# Patient Record
Sex: Female | Born: 1960 | Race: White | Hispanic: No | Marital: Married | Smoking: Former smoker
Health system: Southern US, Community
[De-identification: ages and names within clinical notes are randomized; demographics above are authoritative.]

## PROBLEM LIST (undated history)

## (undated) ENCOUNTER — Emergency Department (HOSPITAL_COMMUNITY): Admission: EM | Source: Home / Self Care

## (undated) DIAGNOSIS — I609 Nontraumatic subarachnoid hemorrhage, unspecified: Secondary | ICD-10-CM

## (undated) DIAGNOSIS — M1990 Unspecified osteoarthritis, unspecified site: Secondary | ICD-10-CM

## (undated) DIAGNOSIS — J302 Other seasonal allergic rhinitis: Secondary | ICD-10-CM

## (undated) DIAGNOSIS — I10 Essential (primary) hypertension: Secondary | ICD-10-CM

## (undated) DIAGNOSIS — I2699 Other pulmonary embolism without acute cor pulmonale: Secondary | ICD-10-CM

## (undated) DIAGNOSIS — C439 Malignant melanoma of skin, unspecified: Secondary | ICD-10-CM

## (undated) DIAGNOSIS — E785 Hyperlipidemia, unspecified: Secondary | ICD-10-CM

## (undated) DIAGNOSIS — T1491XA Suicide attempt, initial encounter: Secondary | ICD-10-CM

## (undated) DIAGNOSIS — D6851 Activated protein C resistance: Secondary | ICD-10-CM

## (undated) DIAGNOSIS — C4490 Unspecified malignant neoplasm of skin, unspecified: Secondary | ICD-10-CM

## (undated) DIAGNOSIS — B182 Chronic viral hepatitis C: Secondary | ICD-10-CM

## (undated) HISTORY — PX: SPLENECTOMY: SUR1306

## (undated) HISTORY — DX: Malignant melanoma of skin, unspecified: C43.9

## (undated) HISTORY — DX: Other pulmonary embolism without acute cor pulmonale: I26.99

## (undated) HISTORY — DX: Essential (primary) hypertension: I10

## (undated) HISTORY — DX: Unspecified malignant neoplasm of skin, unspecified: C44.90

## (undated) HISTORY — PX: CRANIOTOMY: SHX93

## (undated) HISTORY — DX: Hyperlipidemia, unspecified: E78.5

## (undated) HISTORY — DX: Chronic viral hepatitis C: B18.2

## (undated) HISTORY — DX: Nontraumatic subarachnoid hemorrhage, unspecified: I60.9

## (undated) HISTORY — DX: Activated protein C resistance: D68.51

## (undated) HISTORY — DX: Other seasonal allergic rhinitis: J30.2

## (undated) HISTORY — DX: Suicide attempt, initial encounter: T14.91XA

## (undated) HISTORY — DX: Unspecified osteoarthritis, unspecified site: M19.90

---

## 1982-01-14 HISTORY — PX: SPLENECTOMY: SUR1306

## 1997-05-19 ENCOUNTER — Other Ambulatory Visit: Admission: RE | Admit: 1997-05-19 | Discharge: 1997-05-19 | Payer: Self-pay | Admitting: Obstetrics and Gynecology

## 1999-05-05 ENCOUNTER — Inpatient Hospital Stay (HOSPITAL_COMMUNITY): Admission: EM | Admit: 1999-05-05 | Discharge: 1999-05-09 | Payer: Self-pay

## 1999-05-05 ENCOUNTER — Encounter: Payer: Self-pay | Admitting: Surgery

## 1999-05-21 ENCOUNTER — Encounter: Admission: RE | Admit: 1999-05-21 | Discharge: 1999-07-04 | Payer: Self-pay | Admitting: Orthopedic Surgery

## 1999-08-17 ENCOUNTER — Other Ambulatory Visit: Admission: RE | Admit: 1999-08-17 | Discharge: 1999-08-17 | Payer: Self-pay | Admitting: Obstetrics and Gynecology

## 1999-11-13 ENCOUNTER — Inpatient Hospital Stay (HOSPITAL_COMMUNITY): Admission: EM | Admit: 1999-11-13 | Discharge: 1999-11-26 | Payer: Self-pay | Admitting: Emergency Medicine

## 1999-11-13 ENCOUNTER — Encounter: Payer: Self-pay | Admitting: Emergency Medicine

## 1999-11-16 ENCOUNTER — Encounter: Payer: Self-pay | Admitting: Family Medicine

## 1999-11-21 ENCOUNTER — Encounter: Payer: Self-pay | Admitting: Neurosurgery

## 2001-05-14 DIAGNOSIS — I2699 Other pulmonary embolism without acute cor pulmonale: Secondary | ICD-10-CM

## 2001-05-14 HISTORY — DX: Other pulmonary embolism without acute cor pulmonale: I26.99

## 2001-05-17 ENCOUNTER — Inpatient Hospital Stay (HOSPITAL_COMMUNITY): Admission: EM | Admit: 2001-05-17 | Discharge: 2001-05-22 | Payer: Self-pay | Admitting: Emergency Medicine

## 2001-05-17 ENCOUNTER — Encounter: Payer: Self-pay | Admitting: Emergency Medicine

## 2001-05-18 ENCOUNTER — Encounter: Payer: Self-pay | Admitting: Internal Medicine

## 2001-05-20 ENCOUNTER — Encounter: Payer: Self-pay | Admitting: Internal Medicine

## 2001-05-21 ENCOUNTER — Encounter: Payer: Self-pay | Admitting: Internal Medicine

## 2001-05-30 ENCOUNTER — Encounter: Payer: Self-pay | Admitting: Emergency Medicine

## 2001-05-30 ENCOUNTER — Inpatient Hospital Stay (HOSPITAL_COMMUNITY): Admission: EM | Admit: 2001-05-30 | Discharge: 2001-05-31 | Payer: Self-pay

## 2001-06-04 ENCOUNTER — Encounter: Admission: RE | Admit: 2001-06-04 | Discharge: 2001-06-04 | Payer: Self-pay | Admitting: Internal Medicine

## 2001-06-11 ENCOUNTER — Encounter: Admission: RE | Admit: 2001-06-11 | Discharge: 2001-06-11 | Payer: Self-pay | Admitting: Internal Medicine

## 2001-07-13 ENCOUNTER — Encounter: Admission: RE | Admit: 2001-07-13 | Discharge: 2001-07-13 | Payer: Self-pay | Admitting: Internal Medicine

## 2001-07-27 ENCOUNTER — Encounter: Admission: RE | Admit: 2001-07-27 | Discharge: 2001-07-27 | Payer: Self-pay | Admitting: Internal Medicine

## 2002-03-08 ENCOUNTER — Encounter: Admission: RE | Admit: 2002-03-08 | Discharge: 2002-03-08 | Payer: Self-pay | Admitting: Internal Medicine

## 2002-03-11 ENCOUNTER — Encounter: Admission: RE | Admit: 2002-03-11 | Discharge: 2002-03-11 | Payer: Self-pay | Admitting: Internal Medicine

## 2002-04-12 ENCOUNTER — Encounter: Admission: RE | Admit: 2002-04-12 | Discharge: 2002-04-12 | Payer: Self-pay | Admitting: Internal Medicine

## 2002-05-03 ENCOUNTER — Encounter: Admission: RE | Admit: 2002-05-03 | Discharge: 2002-05-03 | Payer: Self-pay | Admitting: Internal Medicine

## 2002-08-09 ENCOUNTER — Encounter: Admission: RE | Admit: 2002-08-09 | Discharge: 2002-08-09 | Payer: Self-pay | Admitting: Internal Medicine

## 2003-04-04 ENCOUNTER — Encounter: Admission: RE | Admit: 2003-04-04 | Discharge: 2003-04-04 | Payer: Self-pay | Admitting: Internal Medicine

## 2003-10-31 ENCOUNTER — Ambulatory Visit: Payer: Self-pay | Admitting: Internal Medicine

## 2003-12-05 ENCOUNTER — Ambulatory Visit: Payer: Self-pay | Admitting: Internal Medicine

## 2004-03-08 ENCOUNTER — Ambulatory Visit: Payer: Self-pay | Admitting: Internal Medicine

## 2004-04-18 ENCOUNTER — Ambulatory Visit: Payer: Self-pay | Admitting: Internal Medicine

## 2004-04-18 ENCOUNTER — Ambulatory Visit (HOSPITAL_COMMUNITY): Admission: RE | Admit: 2004-04-18 | Discharge: 2004-04-18 | Payer: Self-pay | Admitting: Internal Medicine

## 2004-04-20 ENCOUNTER — Ambulatory Visit: Payer: Self-pay | Admitting: Internal Medicine

## 2004-04-23 ENCOUNTER — Ambulatory Visit: Payer: Self-pay | Admitting: Internal Medicine

## 2004-05-07 ENCOUNTER — Ambulatory Visit: Payer: Self-pay | Admitting: Internal Medicine

## 2004-06-26 ENCOUNTER — Emergency Department (HOSPITAL_COMMUNITY): Admission: EM | Admit: 2004-06-26 | Discharge: 2004-06-26 | Payer: Self-pay | Admitting: Emergency Medicine

## 2004-09-10 ENCOUNTER — Ambulatory Visit: Payer: Self-pay | Admitting: Internal Medicine

## 2004-09-24 ENCOUNTER — Ambulatory Visit: Payer: Self-pay | Admitting: Internal Medicine

## 2004-11-08 ENCOUNTER — Ambulatory Visit: Payer: Self-pay | Admitting: Hospitalist

## 2004-12-03 ENCOUNTER — Ambulatory Visit: Payer: Self-pay | Admitting: Internal Medicine

## 2005-09-02 ENCOUNTER — Ambulatory Visit: Payer: Self-pay | Admitting: Internal Medicine

## 2006-08-04 ENCOUNTER — Ambulatory Visit: Payer: Self-pay | Admitting: *Deleted

## 2006-08-04 ENCOUNTER — Encounter: Payer: Self-pay | Admitting: *Deleted

## 2006-08-04 ENCOUNTER — Encounter (INDEPENDENT_AMBULATORY_CARE_PROVIDER_SITE_OTHER): Payer: Self-pay | Admitting: Internal Medicine

## 2006-08-04 ENCOUNTER — Ambulatory Visit: Admission: RE | Admit: 2006-08-04 | Discharge: 2006-08-04 | Payer: Self-pay | Admitting: Hospitalist

## 2006-08-04 ENCOUNTER — Ambulatory Visit (HOSPITAL_COMMUNITY): Admission: RE | Admit: 2006-08-04 | Discharge: 2006-08-04 | Payer: Self-pay | Admitting: Hospitalist

## 2006-08-04 ENCOUNTER — Encounter (INDEPENDENT_AMBULATORY_CARE_PROVIDER_SITE_OTHER): Payer: Self-pay | Admitting: Hospitalist

## 2006-08-04 DIAGNOSIS — I739 Peripheral vascular disease, unspecified: Secondary | ICD-10-CM

## 2006-08-04 DIAGNOSIS — M25529 Pain in unspecified elbow: Secondary | ICD-10-CM

## 2006-08-04 DIAGNOSIS — I10 Essential (primary) hypertension: Secondary | ICD-10-CM

## 2006-08-04 DIAGNOSIS — R42 Dizziness and giddiness: Secondary | ICD-10-CM

## 2006-08-04 DIAGNOSIS — Z86711 Personal history of pulmonary embolism: Secondary | ICD-10-CM

## 2006-08-04 DIAGNOSIS — R079 Chest pain, unspecified: Secondary | ICD-10-CM

## 2006-08-04 DIAGNOSIS — M25579 Pain in unspecified ankle and joints of unspecified foot: Secondary | ICD-10-CM

## 2006-08-04 HISTORY — DX: Essential (primary) hypertension: I10

## 2006-08-06 ENCOUNTER — Encounter (INDEPENDENT_AMBULATORY_CARE_PROVIDER_SITE_OTHER): Payer: Self-pay | Admitting: Internal Medicine

## 2006-08-08 ENCOUNTER — Encounter (INDEPENDENT_AMBULATORY_CARE_PROVIDER_SITE_OTHER): Payer: Self-pay | Admitting: Internal Medicine

## 2006-08-13 ENCOUNTER — Ambulatory Visit (HOSPITAL_COMMUNITY): Admission: RE | Admit: 2006-08-13 | Discharge: 2006-08-13 | Payer: Self-pay | Admitting: Hospitalist

## 2006-08-13 ENCOUNTER — Ambulatory Visit: Payer: Self-pay | Admitting: Internal Medicine

## 2006-09-24 ENCOUNTER — Ambulatory Visit: Payer: Self-pay | Admitting: Internal Medicine

## 2006-09-24 DIAGNOSIS — F4321 Adjustment disorder with depressed mood: Secondary | ICD-10-CM

## 2006-09-24 DIAGNOSIS — K219 Gastro-esophageal reflux disease without esophagitis: Secondary | ICD-10-CM

## 2006-09-24 DIAGNOSIS — C439 Malignant melanoma of skin, unspecified: Secondary | ICD-10-CM | POA: Insufficient documentation

## 2006-10-21 ENCOUNTER — Encounter (INDEPENDENT_AMBULATORY_CARE_PROVIDER_SITE_OTHER): Payer: Self-pay | Admitting: Internal Medicine

## 2006-10-21 ENCOUNTER — Ambulatory Visit: Payer: Self-pay | Admitting: *Deleted

## 2006-10-21 DIAGNOSIS — M25569 Pain in unspecified knee: Secondary | ICD-10-CM

## 2006-10-21 LAB — CONVERTED CEMR LAB: Sed Rate: 4 mm/hr (ref 0–22)

## 2006-11-25 ENCOUNTER — Encounter (INDEPENDENT_AMBULATORY_CARE_PROVIDER_SITE_OTHER): Payer: Self-pay | Admitting: Internal Medicine

## 2006-11-26 ENCOUNTER — Telehealth: Payer: Self-pay | Admitting: *Deleted

## 2006-11-26 ENCOUNTER — Encounter (INDEPENDENT_AMBULATORY_CARE_PROVIDER_SITE_OTHER): Payer: Self-pay | Admitting: Internal Medicine

## 2007-05-04 ENCOUNTER — Telehealth (INDEPENDENT_AMBULATORY_CARE_PROVIDER_SITE_OTHER): Payer: Self-pay | Admitting: Internal Medicine

## 2007-09-22 ENCOUNTER — Telehealth (INDEPENDENT_AMBULATORY_CARE_PROVIDER_SITE_OTHER): Payer: Self-pay | Admitting: Internal Medicine

## 2007-09-28 ENCOUNTER — Ambulatory Visit: Payer: Self-pay | Admitting: Vascular Surgery

## 2007-09-28 ENCOUNTER — Encounter (INDEPENDENT_AMBULATORY_CARE_PROVIDER_SITE_OTHER): Payer: Self-pay | Admitting: Emergency Medicine

## 2007-09-28 ENCOUNTER — Emergency Department (HOSPITAL_COMMUNITY): Admission: EM | Admit: 2007-09-28 | Discharge: 2007-09-28 | Payer: Self-pay | Admitting: Emergency Medicine

## 2007-10-01 ENCOUNTER — Encounter (INDEPENDENT_AMBULATORY_CARE_PROVIDER_SITE_OTHER): Payer: Self-pay | Admitting: *Deleted

## 2007-10-01 ENCOUNTER — Telehealth (INDEPENDENT_AMBULATORY_CARE_PROVIDER_SITE_OTHER): Payer: Self-pay | Admitting: *Deleted

## 2007-10-01 ENCOUNTER — Ambulatory Visit: Payer: Self-pay | Admitting: Internal Medicine

## 2007-10-01 DIAGNOSIS — M549 Dorsalgia, unspecified: Secondary | ICD-10-CM | POA: Insufficient documentation

## 2007-10-01 LAB — CONVERTED CEMR LAB
BUN: 11 mg/dL (ref 6–23)
Calcium: 9.8 mg/dL (ref 8.4–10.5)
Creatinine, Ser: 0.6 mg/dL (ref 0.40–1.20)
Glucose, Bld: 99 mg/dL (ref 70–99)

## 2008-04-26 ENCOUNTER — Emergency Department (HOSPITAL_COMMUNITY): Admission: EM | Admit: 2008-04-26 | Discharge: 2008-04-26 | Payer: Self-pay | Admitting: Emergency Medicine

## 2008-04-26 ENCOUNTER — Encounter (INDEPENDENT_AMBULATORY_CARE_PROVIDER_SITE_OTHER): Payer: Self-pay | Admitting: Emergency Medicine

## 2008-04-26 ENCOUNTER — Ambulatory Visit: Payer: Self-pay | Admitting: Vascular Surgery

## 2008-04-26 ENCOUNTER — Telehealth (INDEPENDENT_AMBULATORY_CARE_PROVIDER_SITE_OTHER): Payer: Self-pay | Admitting: Internal Medicine

## 2008-04-28 ENCOUNTER — Ambulatory Visit: Payer: Self-pay | Admitting: Internal Medicine

## 2008-04-28 ENCOUNTER — Encounter: Payer: Self-pay | Admitting: Internal Medicine

## 2008-04-28 DIAGNOSIS — I82409 Acute embolism and thrombosis of unspecified deep veins of unspecified lower extremity: Secondary | ICD-10-CM | POA: Insufficient documentation

## 2008-04-28 LAB — CONVERTED CEMR LAB
Basophils Absolute: 0.1 10*3/uL (ref 0.0–0.1)
Calcium: 9.2 mg/dL (ref 8.4–10.5)
Creatinine, Ser: 0.6 mg/dL (ref 0.40–1.20)
Eosinophils Absolute: 0.2 10*3/uL (ref 0.0–0.7)
GFR calc non Af Amer: >60 mL/min (ref >60)
INR: 4.5
Lymphocytes Relative: 30 % (ref 12–46)
Lymphs Abs: 3.6 10*3/uL (ref 0.7–4.0)
Neutrophils Relative %: 56 % (ref 43–77)
Platelets: 350 10*3/uL (ref 150–400)
RDW: 12.9 % (ref 11.5–15.5)
WBC: 12.1 10*3/uL — ABNORMAL HIGH (ref 4.0–10.5)

## 2008-05-02 ENCOUNTER — Ambulatory Visit: Payer: Self-pay | Admitting: Internal Medicine

## 2008-05-02 ENCOUNTER — Encounter (INDEPENDENT_AMBULATORY_CARE_PROVIDER_SITE_OTHER): Payer: Self-pay | Admitting: Internal Medicine

## 2008-05-02 LAB — CONVERTED CEMR LAB: INR: 4.2

## 2008-05-09 ENCOUNTER — Ambulatory Visit: Payer: Self-pay | Admitting: Infectious Disease

## 2008-05-09 ENCOUNTER — Encounter (INDEPENDENT_AMBULATORY_CARE_PROVIDER_SITE_OTHER): Payer: Self-pay | Admitting: Internal Medicine

## 2008-05-09 DIAGNOSIS — M7120 Synovial cyst of popliteal space [Baker], unspecified knee: Secondary | ICD-10-CM | POA: Insufficient documentation

## 2008-05-09 LAB — CONVERTED CEMR LAB
INR: 5.5
WBC, Synovial: 450 — ABNORMAL HIGH (ref 0–200)

## 2008-05-10 ENCOUNTER — Encounter (INDEPENDENT_AMBULATORY_CARE_PROVIDER_SITE_OTHER): Payer: Self-pay | Admitting: Internal Medicine

## 2008-05-23 ENCOUNTER — Ambulatory Visit: Payer: Self-pay | Admitting: *Deleted

## 2008-05-23 ENCOUNTER — Encounter (INDEPENDENT_AMBULATORY_CARE_PROVIDER_SITE_OTHER): Payer: Self-pay | Admitting: Internal Medicine

## 2008-05-24 ENCOUNTER — Ambulatory Visit: Payer: Self-pay | Admitting: Infectious Disease

## 2008-05-24 ENCOUNTER — Encounter (INDEPENDENT_AMBULATORY_CARE_PROVIDER_SITE_OTHER): Payer: Self-pay | Admitting: Internal Medicine

## 2008-05-24 LAB — CONVERTED CEMR LAB
BUN: 7 mg/dL (ref 6–23)
Chloride: 103 meq/L (ref 96–112)
Glucose, Bld: 223 mg/dL — ABNORMAL HIGH (ref 70–99)
Potassium: 4 meq/L (ref 3.5–5.3)

## 2008-05-25 DIAGNOSIS — R7401 Elevation of levels of liver transaminase levels: Secondary | ICD-10-CM | POA: Insufficient documentation

## 2008-05-25 DIAGNOSIS — R740 Nonspecific elevation of levels of transaminase and lactic acid dehydrogenase [LDH]: Secondary | ICD-10-CM

## 2008-05-25 LAB — CONVERTED CEMR LAB
Bilirubin, Direct: <0.1 mg/dL (ref 0.0–0.3)
Creatinine, Urine: 241.7 mg/dL
Microalb Creat Ratio: 23.9 mg/g (ref 0.0–30.0)
Total Protein: 8.1 g/dL (ref 6.0–8.3)

## 2008-06-10 ENCOUNTER — Telehealth: Payer: Self-pay | Admitting: *Deleted

## 2008-06-20 ENCOUNTER — Encounter (INDEPENDENT_AMBULATORY_CARE_PROVIDER_SITE_OTHER): Payer: Self-pay | Admitting: Internal Medicine

## 2008-06-20 ENCOUNTER — Ambulatory Visit: Payer: Self-pay | Admitting: Internal Medicine

## 2008-06-20 ENCOUNTER — Ambulatory Visit (HOSPITAL_COMMUNITY): Admission: RE | Admit: 2008-06-20 | Discharge: 2008-06-20 | Payer: Self-pay | Admitting: Internal Medicine

## 2008-06-20 LAB — CONVERTED CEMR LAB
Basophils Relative: 1 % (ref 0–1)
Hgb A1c MFr Bld: 5.5 %
INR: 2
MCHC: 33.6 g/dL (ref 30.0–36.0)
Monocytes Relative: 14 % — ABNORMAL HIGH (ref 3–12)
Neutro Abs: 5.6 10*3/uL (ref 1.7–7.7)
Neutrophils Relative %: 44 % (ref 43–77)
Platelets: 395 10*3/uL (ref 150–400)
RBC: 4.46 M/uL (ref 3.87–5.11)
Sed Rate: 10 mm/hr (ref 0–22)
WBC: 12.9 10*3/uL — ABNORMAL HIGH (ref 4.0–10.5)

## 2008-06-21 ENCOUNTER — Encounter (INDEPENDENT_AMBULATORY_CARE_PROVIDER_SITE_OTHER): Payer: Self-pay | Admitting: Internal Medicine

## 2008-06-21 LAB — CONVERTED CEMR LAB
ALT: 50 units/L — ABNORMAL HIGH (ref 0–35)
AST: 57 units/L — ABNORMAL HIGH (ref 0–37)
Alkaline Phosphatase: 74 units/L (ref 39–117)
Calcium: 9.1 mg/dL (ref 8.4–10.5)
Chloride: 102 meq/L (ref 96–112)
Cholesterol: 212 mg/dL — ABNORMAL HIGH (ref 0–200)
Creatinine, Ser: 0.7 mg/dL (ref 0.40–1.20)
Creatinine, Urine: 213.5 mg/dL
HDL: 39 mg/dL — ABNORMAL LOW (ref >39)
Microalb Creat Ratio: 10 mg/g (ref 0.0–30.0)
Microalb, Ur: 2.14 mg/dL — ABNORMAL HIGH (ref 0.00–1.89)
Total Bilirubin: 0.6 mg/dL (ref 0.3–1.2)
Triglycerides: 274 mg/dL — ABNORMAL HIGH (ref <150)

## 2008-06-22 ENCOUNTER — Encounter (INDEPENDENT_AMBULATORY_CARE_PROVIDER_SITE_OTHER): Payer: Self-pay | Admitting: Internal Medicine

## 2008-06-22 ENCOUNTER — Ambulatory Visit: Payer: Self-pay | Admitting: Internal Medicine

## 2008-06-27 LAB — CONVERTED CEMR LAB
HCV Ab: REACTIVE — AB
Hep A IgM: NEGATIVE
Hep B C IgM: NEGATIVE
Hep B Core Total Ab: NEGATIVE
Hepatitis B Surface Ag: NEGATIVE

## 2008-06-29 ENCOUNTER — Ambulatory Visit: Payer: Self-pay | Admitting: Sports Medicine

## 2008-06-29 ENCOUNTER — Encounter (INDEPENDENT_AMBULATORY_CARE_PROVIDER_SITE_OTHER): Payer: Self-pay | Admitting: *Deleted

## 2008-07-04 ENCOUNTER — Ambulatory Visit: Payer: Self-pay | Admitting: Internal Medicine

## 2008-07-21 ENCOUNTER — Telehealth: Payer: Self-pay | Admitting: *Deleted

## 2008-07-29 ENCOUNTER — Encounter (INDEPENDENT_AMBULATORY_CARE_PROVIDER_SITE_OTHER): Payer: Self-pay | Admitting: Internal Medicine

## 2008-07-29 ENCOUNTER — Ambulatory Visit: Payer: Self-pay | Admitting: Infectious Diseases

## 2008-07-29 DIAGNOSIS — E785 Hyperlipidemia, unspecified: Secondary | ICD-10-CM

## 2008-08-01 ENCOUNTER — Ambulatory Visit: Payer: Self-pay | Admitting: Internal Medicine

## 2008-08-01 ENCOUNTER — Ambulatory Visit: Payer: Self-pay | Admitting: Sports Medicine

## 2008-08-01 LAB — CONVERTED CEMR LAB: INR: 1.7

## 2008-08-04 DIAGNOSIS — B182 Chronic viral hepatitis C: Secondary | ICD-10-CM | POA: Insufficient documentation

## 2008-08-04 LAB — CONVERTED CEMR LAB: HCV Quantitative: 3810000 intl units/mL — ABNORMAL HIGH (ref <43)

## 2008-08-08 ENCOUNTER — Ambulatory Visit: Payer: Self-pay | Admitting: Internal Medicine

## 2008-08-08 ENCOUNTER — Encounter: Payer: Self-pay | Admitting: Pharmacist

## 2008-08-08 LAB — CONVERTED CEMR LAB

## 2008-08-09 ENCOUNTER — Telehealth: Payer: Self-pay | Admitting: *Deleted

## 2008-08-17 ENCOUNTER — Encounter (INDEPENDENT_AMBULATORY_CARE_PROVIDER_SITE_OTHER): Payer: Self-pay | Admitting: Internal Medicine

## 2008-08-29 ENCOUNTER — Ambulatory Visit: Payer: Self-pay | Admitting: Internal Medicine

## 2008-08-29 LAB — CONVERTED CEMR LAB: INR: 1.5

## 2008-09-06 ENCOUNTER — Ambulatory Visit: Payer: Self-pay | Admitting: Internal Medicine

## 2008-09-06 LAB — CONVERTED CEMR LAB
AST: 57 units/L — ABNORMAL HIGH (ref 0–37)
Albumin: 4.2 g/dL (ref 3.5–5.2)
Alkaline Phosphatase: 77 units/L (ref 39–117)
BUN: 7 mg/dL (ref 6–23)
Calcium: 9.9 mg/dL (ref 8.4–10.5)
Chloride: 101 meq/L (ref 96–112)
Creatinine, Ser: 0.59 mg/dL (ref 0.40–1.20)
Glucose, Bld: 143 mg/dL — ABNORMAL HIGH (ref 70–99)
Potassium: 3.7 meq/L (ref 3.5–5.3)

## 2008-09-12 ENCOUNTER — Ambulatory Visit: Payer: Self-pay | Admitting: Sports Medicine

## 2008-09-14 ENCOUNTER — Telehealth: Payer: Self-pay | Admitting: *Deleted

## 2008-09-15 ENCOUNTER — Encounter (INDEPENDENT_AMBULATORY_CARE_PROVIDER_SITE_OTHER): Payer: Self-pay | Admitting: Internal Medicine

## 2008-09-15 ENCOUNTER — Ambulatory Visit: Payer: Self-pay | Admitting: Gastroenterology

## 2008-09-26 ENCOUNTER — Ambulatory Visit: Payer: Self-pay | Admitting: Infectious Diseases

## 2008-09-26 LAB — CONVERTED CEMR LAB: INR: 1.7

## 2008-10-07 ENCOUNTER — Telehealth: Payer: Self-pay | Admitting: *Deleted

## 2008-10-10 ENCOUNTER — Ambulatory Visit: Payer: Self-pay | Admitting: Internal Medicine

## 2008-10-10 LAB — CONVERTED CEMR LAB: INR: 4.2

## 2008-10-31 ENCOUNTER — Ambulatory Visit: Payer: Self-pay | Admitting: Internal Medicine

## 2008-11-28 ENCOUNTER — Ambulatory Visit: Payer: Self-pay | Admitting: Internal Medicine

## 2008-11-28 LAB — CONVERTED CEMR LAB
Hgb A1c MFr Bld: 5.3 %
INR: 2.7

## 2008-12-14 ENCOUNTER — Encounter (INDEPENDENT_AMBULATORY_CARE_PROVIDER_SITE_OTHER): Payer: Self-pay | Admitting: Internal Medicine

## 2008-12-14 LAB — HM DIABETES EYE EXAM

## 2008-12-15 ENCOUNTER — Ambulatory Visit: Payer: Self-pay | Admitting: Internal Medicine

## 2008-12-15 LAB — CONVERTED CEMR LAB
CO2: 27 meq/L (ref 19–32)
Chloride: 102 meq/L (ref 96–112)
Glucose, Bld: 100 mg/dL — ABNORMAL HIGH (ref 70–99)
Sodium: 142 meq/L (ref 135–145)

## 2008-12-19 ENCOUNTER — Telehealth: Payer: Self-pay | Admitting: Licensed Clinical Social Worker

## 2009-01-17 ENCOUNTER — Telehealth: Payer: Self-pay | Admitting: *Deleted

## 2009-02-08 ENCOUNTER — Telehealth: Payer: Self-pay | Admitting: *Deleted

## 2009-02-22 ENCOUNTER — Telehealth (INDEPENDENT_AMBULATORY_CARE_PROVIDER_SITE_OTHER): Payer: Self-pay | Admitting: Internal Medicine

## 2009-03-03 ENCOUNTER — Ambulatory Visit: Payer: Self-pay | Admitting: Internal Medicine

## 2009-03-03 DIAGNOSIS — Z9181 History of falling: Secondary | ICD-10-CM

## 2009-03-27 ENCOUNTER — Ambulatory Visit (HOSPITAL_COMMUNITY): Admission: RE | Admit: 2009-03-27 | Discharge: 2009-03-27 | Payer: Self-pay | Admitting: Internal Medicine

## 2009-03-27 LAB — HM MAMMOGRAPHY: HM Mammogram: NEGATIVE

## 2009-04-26 ENCOUNTER — Telehealth: Payer: Self-pay | Admitting: *Deleted

## 2009-05-11 ENCOUNTER — Encounter (INDEPENDENT_AMBULATORY_CARE_PROVIDER_SITE_OTHER): Payer: Self-pay | Admitting: Internal Medicine

## 2009-05-11 ENCOUNTER — Ambulatory Visit: Payer: Self-pay | Admitting: Gastroenterology

## 2009-05-12 ENCOUNTER — Encounter (INDEPENDENT_AMBULATORY_CARE_PROVIDER_SITE_OTHER): Payer: Self-pay | Admitting: *Deleted

## 2009-05-17 ENCOUNTER — Telehealth (INDEPENDENT_AMBULATORY_CARE_PROVIDER_SITE_OTHER): Payer: Self-pay | Admitting: Internal Medicine

## 2009-06-07 ENCOUNTER — Ambulatory Visit: Payer: Self-pay | Admitting: Internal Medicine

## 2009-06-07 ENCOUNTER — Encounter (INDEPENDENT_AMBULATORY_CARE_PROVIDER_SITE_OTHER): Payer: Self-pay | Admitting: Internal Medicine

## 2009-06-07 DIAGNOSIS — L989 Disorder of the skin and subcutaneous tissue, unspecified: Secondary | ICD-10-CM | POA: Insufficient documentation

## 2009-06-07 DIAGNOSIS — F329 Major depressive disorder, single episode, unspecified: Secondary | ICD-10-CM

## 2009-06-07 LAB — CONVERTED CEMR LAB: INR: 3.3

## 2009-06-08 ENCOUNTER — Telehealth (INDEPENDENT_AMBULATORY_CARE_PROVIDER_SITE_OTHER): Payer: Self-pay | Admitting: Internal Medicine

## 2009-06-08 ENCOUNTER — Telehealth (INDEPENDENT_AMBULATORY_CARE_PROVIDER_SITE_OTHER): Payer: Self-pay | Admitting: Pharmacist

## 2009-06-09 ENCOUNTER — Telehealth (INDEPENDENT_AMBULATORY_CARE_PROVIDER_SITE_OTHER): Payer: Self-pay | Admitting: Internal Medicine

## 2009-06-26 ENCOUNTER — Ambulatory Visit: Payer: Self-pay | Admitting: Internal Medicine

## 2009-06-26 LAB — CONVERTED CEMR LAB: INR: 3.6

## 2009-07-04 ENCOUNTER — Ambulatory Visit: Payer: Self-pay | Admitting: Internal Medicine

## 2009-07-04 LAB — CONVERTED CEMR LAB
Chloride: 102 meq/L (ref 96–112)
Potassium: 3.9 meq/L (ref 3.5–5.3)
Sodium: 141 meq/L (ref 135–145)

## 2009-07-24 ENCOUNTER — Ambulatory Visit: Payer: Self-pay | Admitting: Internal Medicine

## 2009-07-24 LAB — CONVERTED CEMR LAB: INR: 2.8

## 2009-08-03 ENCOUNTER — Telehealth: Payer: Self-pay | Admitting: Internal Medicine

## 2009-08-11 ENCOUNTER — Telehealth: Payer: Self-pay | Admitting: Internal Medicine

## 2009-08-21 ENCOUNTER — Ambulatory Visit: Payer: Self-pay | Admitting: Internal Medicine

## 2009-08-21 LAB — CONVERTED CEMR LAB: INR: 2.2

## 2009-09-06 ENCOUNTER — Telehealth (INDEPENDENT_AMBULATORY_CARE_PROVIDER_SITE_OTHER): Payer: Self-pay | Admitting: *Deleted

## 2009-09-13 ENCOUNTER — Ambulatory Visit: Payer: Self-pay | Admitting: Internal Medicine

## 2009-09-13 ENCOUNTER — Telehealth: Payer: Self-pay | Admitting: Internal Medicine

## 2009-09-13 DIAGNOSIS — F411 Generalized anxiety disorder: Secondary | ICD-10-CM

## 2009-09-13 LAB — CONVERTED CEMR LAB
Blood Glucose, Fingerstick: 170
Hgb A1c MFr Bld: 5 %
Microalb Creat Ratio: 6 mg/g (ref 0.0–30.0)

## 2009-09-19 ENCOUNTER — Encounter: Payer: Self-pay | Admitting: Internal Medicine

## 2009-09-19 ENCOUNTER — Encounter: Payer: Self-pay | Admitting: Licensed Clinical Social Worker

## 2009-09-22 ENCOUNTER — Telehealth: Payer: Self-pay | Admitting: Licensed Clinical Social Worker

## 2009-10-09 ENCOUNTER — Ambulatory Visit: Payer: Self-pay | Admitting: Internal Medicine

## 2009-10-23 ENCOUNTER — Ambulatory Visit: Payer: Self-pay | Admitting: Internal Medicine

## 2009-10-23 LAB — CONVERTED CEMR LAB: INR: 2

## 2009-11-07 ENCOUNTER — Telehealth: Payer: Self-pay | Admitting: Internal Medicine

## 2009-11-27 ENCOUNTER — Ambulatory Visit: Payer: Self-pay | Admitting: Internal Medicine

## 2009-11-27 LAB — CONVERTED CEMR LAB

## 2009-12-11 ENCOUNTER — Ambulatory Visit: Payer: Self-pay | Admitting: Internal Medicine

## 2009-12-11 LAB — CONVERTED CEMR LAB

## 2009-12-25 ENCOUNTER — Ambulatory Visit: Payer: Self-pay | Admitting: Internal Medicine

## 2010-01-10 ENCOUNTER — Telehealth: Payer: Self-pay | Admitting: Internal Medicine

## 2010-01-10 ENCOUNTER — Ambulatory Visit: Payer: Self-pay | Admitting: Internal Medicine

## 2010-01-11 LAB — CONVERTED CEMR LAB
ALT: 32 units/L (ref 0–35)
Albumin: 3.9 g/dL (ref 3.5–5.2)
Cholesterol: 201 mg/dL — ABNORMAL HIGH (ref 0–200)
LDL Cholesterol: 93 mg/dL (ref 0–99)
Total CHOL/HDL Ratio: 4
Total Protein: 7.2 g/dL (ref 6.0–8.3)
Triglycerides: 290 mg/dL — ABNORMAL HIGH (ref <150)
VLDL: 58 mg/dL — ABNORMAL HIGH (ref 0–40)

## 2010-01-19 ENCOUNTER — Telehealth: Payer: Self-pay | Admitting: Internal Medicine

## 2010-01-22 ENCOUNTER — Ambulatory Visit: Admission: RE | Admit: 2010-01-22 | Discharge: 2010-01-22 | Payer: Self-pay | Source: Home / Self Care

## 2010-01-22 LAB — CONVERTED CEMR LAB: INR: 1.7

## 2010-02-03 DIAGNOSIS — Z7901 Long term (current) use of anticoagulants: Secondary | ICD-10-CM | POA: Insufficient documentation

## 2010-02-03 DIAGNOSIS — I2699 Other pulmonary embolism without acute cor pulmonale: Secondary | ICD-10-CM

## 2010-02-03 DIAGNOSIS — I82409 Acute embolism and thrombosis of unspecified deep veins of unspecified lower extremity: Secondary | ICD-10-CM

## 2010-02-11 LAB — CONVERTED CEMR LAB
Barbiturate Quant, Ur: NEGATIVE
Basophils Absolute: 0.1 10*3/uL (ref 0.0–0.1)
Calcium: 9.2 mg/dL (ref 8.4–10.5)
Cocaine Metabolites: NEGATIVE
Creatinine,U: 86.7 mg/dL
Eosinophils Absolute: 0.1 10*3/uL (ref 0.0–0.7)
Eosinophils Relative: 1 % (ref 0–5)
Glucose, Bld: 105 mg/dL — ABNORMAL HIGH (ref 70–99)
HCT: 43.4 % (ref 36.0–46.0)
Hemoglobin, Urine: NEGATIVE
Leukocytes, UA: NEGATIVE
Lymphs Abs: 4.1 10*3/uL — ABNORMAL HIGH (ref 0.7–3.3)
MCHC: 34.1 g/dL (ref 30.0–36.0)
MCV: 97.7 fL (ref 78.0–100.0)
Methadone: POSITIVE — AB
Nitrite: NEGATIVE
Platelets: 388 10*3/uL (ref 150–400)
Propoxyphene: NEGATIVE
RDW: 13.1 % (ref 11.5–14.0)
Sodium: 138 meq/L (ref 135–145)
pH: 6.5 (ref 5.0–8.0)

## 2010-02-12 ENCOUNTER — Ambulatory Visit: Admission: RE | Admit: 2010-02-12 | Discharge: 2010-02-12 | Payer: Self-pay | Source: Home / Self Care

## 2010-02-12 LAB — CONVERTED CEMR LAB

## 2010-02-13 NOTE — Progress Notes (Signed)
Summary: refill/ hla  Phone Note Refill Request Message from:  Patient on February 08, 2009 4:38 PM  Refills Requested: Medication #1:  XANAX 0.25 MG  TABS Take 1 tablet by mouth three times a day   Last Refilled: 1/5 Initial call taken by: Marin Roberts RN,  February 08, 2009 4:38 PM  Additional Follow-up for Phone Call Additional follow up Details #1::        this was called to pharm, pharmacist made me aware that dr fields had filled this 01/20/09 for #25 Additional Follow-up by: Marin Roberts RN,  February 13, 2009 10:14 AM    Prescriptions: Prudy Feeler 0.25 MG  TABS (ALPRAZOLAM) Take 1 tablet by mouth three times a day  #25 x 0   Entered and Authorized by:   Zara Council MD   Signed by:   Zara Council MD on 02/09/2009   Method used:   Telephoned to ...       [facility] [address] [phone]* (retail)       [address]       [place], [place]  [address]       Ph: [phone]       Fax: [phone]   RxID:   [id number]

## 2010-02-13 NOTE — Assessment & Plan Note (Signed)
Summary: 930AM COU APPT/CH  Anticoagulant Therapy Managed by: Barbera Setters. Janie Morning  PharmD CACP Referring MD: Josem Kaufmann MD, Lyman Bishop PCP: Clerance Lav MD [facility] Attending: Darl Pikes, Beth Indication 1: Pulmonary  embolus Indication 2: Encounter for therapeutic drug monitoring  V58.83 Start date: 05/20/2001 Duration: Indefinite  Patient Assessment Reviewed by: Chancy Milroy PharmD  December 11, 2009 Medication review: verified warfarin dosage & schedule,verified previous prescription medications, verified doses & any changes, verified new medications, reviewed OTC medications, reviewed OTC health products-vitamins supplements etc Complications: none Dietary changes: none   Health status changes: none   Lifestyle changes: none   Recent/future hospitalizations: none   Recent/future procedures: none   Recent/future dental: none Patient Assessment Part 2:  Have you MISSED ANY DOSES or CHANGED TABLETS?  YES. States she missed a dose (one single dose) about 2 weeks ago.  Have you had any BRUISING or BLEEDING ( nose or gum bleeds,blood in urine or stool)?  No reported bruising or bleeding in nose, gums, urine, stool.  Have you STARTED or STOPPED any MEDICATIONS, including OTC meds,herbals or supplements?  No other medications or herbal supplements were started or stopped.  Have you CHANGED your DIET, especially green vegetables,or ALCOHOL intake?  No changes in diet or alcohol intake.  Have you had any ILLNESSES or HOSPITALIZATIONS?  No reported illnesses or hospitalizations  Have you had any signs of CLOTTING?(chest discomfort,dizziness,shortness of breath,arms tingling,slurred speech,swelling or redness in leg)    No chest discomfort, dizziness, shortness of breath, tingling in arm, slurred speech, swelling, or redness in leg.     Treatment  Target INR: 2.0-3.0 INR: 3.3  Date: 12/11/2009 Regimen In:  15.0mg /week INR reflects regimen in: 3.3  New  Tablet strength: : 2mg  Regimen  Out:     Sunday: 1 Tablet     Monday: 1 Tablet     Tuesday: 1 Tablet     Wednesday: 1/2 Tablet     Thursday: 1 Tablet      Friday: 1 Tablet     Saturday: 1 Tablet Total Weekly: 13.0mg /week mg  Next INR Due: 12/25/2009 Adjusted by: Barbera Setters. Alexandria Lodge III PharmD CACP   Return to anticoagulation clinic:  12/25/2009 Time of next visit: 0945    Allergies: No Known Drug Allergies

## 2010-02-13 NOTE — Progress Notes (Signed)
Summary: refill/ hla  Phone Note Refill Request Message from:  Patient on January 17, 2009 10:18 AM  Refills Requested: Medication #1:  XANAX 0.25 MG  TABS Take 1 tablet by mouth three times a day   Last Refilled: 12/2  Medication #2:  TRAMADOL HCL 50 MG  TABS 1 by mouth q 6hrs prn Initial call taken by: Marin Roberts RN,  January 17, 2009 10:18 AM  Follow-up for Phone Call        Rx faxed to pharmacy Follow-up by: Angelina Ok RN,  January 20, 2009 11:47 AM    Prescriptions: TRAMADOL HCL 50 MG  TABS (TRAMADOL HCL) 1 by mouth q 6hrs prn  #120 x 3   Entered and Authorized by:   Zara Council MD   Signed by:   Zara Council MD on 01/18/2009   Method used:   Telephoned to ...       [facility] [address] [phone]* (retail)       [address]       [place], [place]  [address]       Ph: [phone]       Fax: [phone]   RxID:   [id number] XANAX 0.25 MG  TABS (ALPRAZOLAM) Take 1 tablet by mouth three times a day  #25 x 0   Entered and Authorized by:   Zara Council MD   Signed by:   Zara Council MD on 01/18/2009   Method used:   Telephoned to ...       [facility] [address] [phone]* (retail)       [address]       [place], [place]  [address]       Ph: [phone]       Fax: [phone]   RxID:   [id number]

## 2010-02-13 NOTE — Assessment & Plan Note (Signed)
Summary: cou/ch  Anticoagulant Therapy Managed by: Barbera Setters. Janie Morning  PharmD CACP PCP: Zara Council MD [facility] Attending: Darl Pikes, Beth Indication 1: Pulmonary  embolus Indication 2: Encounter for therapeutic drug monitoring  V58.83 Start date: 05/20/2001 Duration: Indefinite  Patient Assessment Reviewed by: Chancy Milroy PharmD  June 26, 2009 Medication review: verified warfarin dosage & schedule,verified previous prescription medications, verified doses & any changes, verified new medications, reviewed OTC medications, reviewed OTC health products-vitamins supplements etc Complications: none Dietary changes: none   Health status changes: none   Lifestyle changes: none   Recent/future hospitalizations: none   Recent/future procedures: none   Recent/future dental: none Patient Assessment Part 2:  Have you MISSED ANY DOSES or CHANGED TABLETS?  No missed Warfarin doses or changed tablets.  Have you had any BRUISING or BLEEDING ( nose or gum bleeds,blood in urine or stool)?  No reported bruising or bleeding in nose, gums, urine, stool.  Have you STARTED or STOPPED any MEDICATIONS, including OTC meds,herbals or supplements?  No other medications or herbal supplements were started or stopped.  Have you CHANGED your DIET, especially green vegetables,or ALCOHOL intake?  No changes in diet or alcohol intake.  Have you had any ILLNESSES or HOSPITALIZATIONS?  No reported illnesses or hospitalizations  Have you had any signs of CLOTTING?(chest discomfort,dizziness,shortness of breath,arms tingling,slurred speech,swelling or redness in leg)    No chest discomfort, dizziness, shortness of breath, tingling in arm, slurred speech, swelling, or redness in leg.     Treatment  Target INR: 2.0-3.0 INR: 3.6  Date: 06/26/2009 Regimen In:  14.0mg /week INR reflects regimen in: 3.6  New  Tablet strength: : 2mg  Regimen Out:     Sunday: 1 Tablet     Monday: 1 Tablet     Tuesday: 1/2  Tablet     Wednesday: 1 Tablet     Thursday: 1 Tablet      Friday: 1/2 Tablet     Saturday: 1 Tablet Total Weekly: 12.0mg /week mg  Next INR Due: 07/24/2009 Adjusted by: Barbera Setters. Alexandria Lodge III PharmD CACP   Return to anticoagulation clinic:  07/24/2009 Time of next visit: 0915   Comments: Patient had Dr. Shara Blazing change her warfarin Rx FROM 1mg  tablets to 2mg  tablets--(since she was taking multiple's of the 1mg  warfarin tablet). This keeps her monthly co-pay less, etc.   Allergies: No Known Drug Allergies

## 2010-02-13 NOTE — Progress Notes (Signed)
Summary: med refill/gp  Phone Note Refill Request Message from:  Patient on September 06, 2009 9:58 AM  Refills Requested: Medication #1:  METFORMIN HCL 500 MG TABS Take 1 tablet by mouth once a day Pt. has an appt. w/Dr. Sherryll Burger next Wednesday.   Method Requested: Electronic Initial call taken by: Chinita Pester RN,  September 06, 2009 9:57 AM    Prescriptions: METFORMIN HCL 500 MG TABS (METFORMIN HCL) Take 1 tablet by mouth once a day  #31 x 11   Entered and Authorized by:   Zoila Shutter MD   Signed by:   Zoila Shutter MD on 09/06/2009   Method used:   Electronically to        [facility] [phone]* (retail)       [address]       [place], [place]  [address]       Ph: [phone]       Fax: [phone]   RxID:   [id number]

## 2010-02-13 NOTE — Progress Notes (Signed)
Summary: refill/ hla  Phone Note Refill Request Message from:  Patient on August 11, 2009 12:23 PM  Refills Requested: Medication #1:  XANAX 0.25 MG  TABS Take 1 tablet by mouth two times a day   Dosage confirmed as above?Dosage Confirmed   Last Refilled: 6/21 Initial call taken by: Marin Roberts RN,  August 11, 2009 12:23 PM  Follow-up for Phone Call        Refill approved-nurse to complete. Please schedule pt for follow up in one month. I have never seen the patient before. Xanax needs to change to klonapin.  Follow-up by: Clerance Lav MD,  August 16, 2009 8:32 AM    Prescriptions: Prudy Feeler 0.25 MG  TABS (ALPRAZOLAM) Take 1 tablet by mouth two times a day  #60 x 0   Entered and Authorized by:   Clerance Lav MD   Signed by:   Clerance Lav MD on 08/16/2009   Method used:   Telephoned to ...       [facility] [address] [phone]* (retail)       [address]       [place], [place]  [address]       Ph: [phone]       Fax: [phone]   RxID:   [id number]   Appended Document: refill/ hla appt scheduled 8/31 at 0830

## 2010-02-13 NOTE — Assessment & Plan Note (Signed)
Summary: est-1 month f/u visit/ch   Vital Signs:  Patient profile:   50 year old female Height:      67 inches (170.18 cm) Weight:      147.2 pounds (67.74 kg) BMI:     23.14 Temp:     97.9 degrees F (36.61 degrees C) oral Pulse rate:   79 / minute BP sitting:   141 / 84  (left arm) Cuff size:   regular  Vitals Entered By: Theotis Barrio NT II (July 04, 2009 9:18 AM) CC: PATIENT IS HERE FOR FOLLOW UP APPT Is Patient Diabetic? Yes Did you bring your meter with you today? No Pain Assessment Patient in pain? yes     Location: R/L LEG Intensity:  8 Type: NAGGING PAIN Onset of pain  FOR ABOUT A MONTH 1/2 Nutritional Status BMI of 19 -24 = normal  Have you ever been in a relationship where you felt threatened, hurt or afraid?No   Does patient need assistance? Functional Status Self care Ambulation Normal Comments PATIENT IS HERE FOR FOLLOW UP APPT   Primary Care Provider:  Zara Council MD  CC:  PATIENT IS HERE FOR FOLLOW UP APPT.  History of Present Illness: Sarah Simon is a 50 year old Female with PMH/problems as outlined in the EMR, who presents to [facility] with chief complaint(s) of:    1. DM/HTN: no new complaints, taking meds as prescribed. 2. Hep C: following with hep clinic, they wanted psych review before starting treatment. She is also getting hep A and hep B vaccines with [place]. Due for dose 2 of hep B. 3. Knee pain: still bothering as before, taking tramadol as needed. 4. Depression: not taking celexa, she uses xanax for anxiety.     Preventive Screening-Counseling & Management  Alcohol-Tobacco     Smoking Status: quit     Year Quit: as teenager - just tried and quit  Caffeine-Diet-Exercise     Does Patient Exercise: yes     Type of exercise: WALKING     Exercise (avg: min/session): 30-60  Current Medications (verified): 1)  Hydrochlorothiazide 25 Mg  Tabs (Hydrochlorothiazide) .... Take 1 Tablet By Mouth Once A Day 2)  Xanax 0.25 Mg  Tabs  (Alprazolam) .... Take 1 Tablet By Mouth Two Times A Day 3)  Coumadin 2 Mg Tabs (Warfarin Sodium) .... Take As Directed. 4)  Metformin Hcl 500 Mg Tabs (Metformin Hcl) .... Take 1 Tablet By Mouth Once A Day 5)  Tramadol Hcl 50 Mg  Tabs (Tramadol Hcl) .Marland Kitchen.. 1 By Mouth Q 6hrs Prn 6)  Lisinopril 5 Mg Tabs (Lisinopril) .... Take 1 Tablet By Mouth Once A Day 7)  Vicodin 5-500 Mg Tabs (Hydrocodone-Acetaminophen) .... Please Take One Pill As Needed For Pain, Not More Than Four Times A Day. 8)  Citalopram Hydrobromide 20 Mg Tabs (Citalopram Hydrobromide) .... Take 1 Tablet By Mouth Once A Day  Allergies (verified): No Known Drug Allergies  Past History:  Past Medical History: Last updated: 09/06/2008 PE Factor V Leiden 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 SAH Multiple substance abuse Melanoma Suicidal attempt  Past Surgical History: Last updated: 08/04/2006 Craniotomy Splenectomy  Family History: Last updated: 08/04/2006 Mom had aneurysm in the brain, died at 36s dad died of heart attack at 81s  Social History: Last updated: 08/04/2006 lives with husband and two kids, no job pays for health care out of her own pocket, trying for medicaid as husband out of job too Former Smoker  Risk Factors: Exercise: yes (07/04/2009)  Risk Factors: Smoking Status: quit (07/04/2009)  Social History: Does Patient Exercise:  yes  Review of Systems      See HPI  Physical Exam  General:  alert and well-developed.   Head:  normocephalic and atraumatic.   Lungs:  normal respiratory effort and normal breath sounds.   Heart:  normal rate and regular rhythm.   Abdomen:  soft and non-tender.   Msk:  Right knee on brace, mildly tender bilaterally, no swelling, redness Pulses:  normal peripheral pulses  Extremities:  no cyanosis, clubbing or edema  Neurologic:  non focal.  Psych:  Oriented X3 and normally interactive.     Impression & Recommendations:  Problem # 1:  HYPERTENSION (ICD-401.9) BP noted to be  slightly high, was normal on the past two visits. Pt says she is stressed because was running late and couldn't find parking spot. Will increase lisinopril if persistently increased. I have asked her to check her BP when she goes to [employer]. Will check b-met today.  Her updated medication list for this problem includes:    Hydrochlorothiazide 25 Mg Tabs (Hydrochlorothiazide) .Marland Kitchen... Take 1 tablet by mouth once a day    Lisinopril 5 Mg Tabs (Lisinopril) .Marland Kitchen... Take 1 tablet by mouth once a day  Orders: T-Basic Metabolic Panel [phone])  Problem # 2:  DIABETES MELLITUS, TYPE II, MILD (ICD-250.00) Well controlled. No changes today.  Her updated medication list for this problem includes:    Metformin Hcl 500 Mg Tabs (Metformin hcl) .Marland Kitchen... Take 1 tablet by mouth once a day    Lisinopril 5 Mg Tabs (Lisinopril) .Marland Kitchen... Take 1 tablet by mouth once a day  Problem # 3:  HEPATITIS C CARRIER (ICD-V02.62) Will follow with liver clinic.   Problem # 4:  PULMONARY EMBOLISM (ICD-415.19) ON coumadin, being monitored by Dr. Alexandria Lodge. Her updated medication list for this problem includes:    Coumadin 2 Mg Tabs (Warfarin sodium) .Marland Kitchen... Take as directed.  Problem # 5:  KNEE PAIN (ICD-719.46) Continue with supportive care.   Her updated medication list for this problem includes:    Tramadol Hcl 50 Mg Tabs (Tramadol hcl) .Marland Kitchen... 1 by mouth q 6hrs prn    Vicodin 5-500 Mg Tabs (Hydrocodone-acetaminophen) .Marland Kitchen... Please take one pill as needed for pain, not more than four times a day.  Problem # 6:  DEPRESSION (ICD-311) Off citalopram, she is only taking as needed xanax.  The following medications were removed from the medication list:    Citalopram Hydrobromide 20 Mg Tabs (Citalopram hydrobromide) .Marland Kitchen... Take 1 tablet by mouth once a day Her updated medication list for this problem includes:    Xanax 0.25 Mg Tabs (Alprazolam) .Marland Kitchen... Take 1 tablet by mouth two times a day  Complete Medication List: 1)   Hydrochlorothiazide 25 Mg Tabs (Hydrochlorothiazide) .... Take 1 tablet by mouth once a day 2)  Xanax 0.25 Mg Tabs (Alprazolam) .... Take 1 tablet by mouth two times a day 3)  Coumadin 2 Mg Tabs (Warfarin sodium) .... Take as directed. 4)  Metformin Hcl 500 Mg Tabs (Metformin hcl) .... Take 1 tablet by mouth once a day 5)  Tramadol Hcl 50 Mg Tabs (Tramadol hcl) .Marland Kitchen.. 1 by mouth q 6hrs prn 6)  Lisinopril 5 Mg Tabs (Lisinopril) .... Take 1 tablet by mouth once a day 7)  Vicodin 5-500 Mg Tabs (Hydrocodone-acetaminophen) .... Please take one pill as needed for pain, not more than four times a day.  Patient Instructions: 1)  Please schedule a follow-up  appointment in 5 months. 2)  You will need your Hep A and Hep B vaccines when you come back. 3)  You should not take any alcohol to protect your liver. 4)  We will let you know if anything wrong with your lab work.   5)  Check your Blood Pressure regularly. If it is above 130:80 you should make an appointment. Prescriptions: XANAX 0.25 MG  TABS (ALPRAZOLAM) Take 1 tablet by mouth two times a day  #60 x 0   Entered and Authorized by:   Zara Council MD   Signed by:   Zara Council MD on 07/04/2009   Method used:   Print then Give to Patient   RxID:   [id number]  Process Orders Check Orders Results:     [facility]: ABN not required for this insurance Tests Sent for requisitioning (July 04, 2009 10:03 AM):     07/04/2009: [facility] -- T-Basic Metabolic Panel [id number] (signed)    Prevention & Chronic Care Immunizations   Influenza vaccine: Not documented   Influenza vaccine deferral: Refused  (11/28/2008)    Tetanus booster: Not documented   Td booster deferral: Refused  (03/03/2009)    Pneumococcal vaccine: Not documented   Pneumococcal vaccine deferral: Refused  (03/03/2009)  Other Screening   Pap smear: Not documented   Pap smear action/deferral: Deferred  (03/03/2009)    Mammogram:  ASSESSMENT: Negative - BI-RADS 1^MM DIGITAL SCREENING  (03/27/2009)   Mammogram action/deferral: Ordered  (03/03/2009)   Smoking status: quit  (07/04/2009)  Diabetes Mellitus   HgbA1C: 5.3  (06/07/2009)    Eye exam: No diabetic retinopathy.     (12/14/2008)   Eye exam due: 12/2009    Foot exam: Not documented   Foot exam action/deferral: Do today   High risk foot: No  (03/03/2009)   Foot care education: Not documented   Foot exam due: 03/02/2010    Urine microalbumin/creatinine ratio: 10.0  (06/20/2008)    Diabetes flowsheet reviewed?: Yes   Progress toward A1C goal: At goal  Lipids   Total Cholesterol: 212  (06/20/2008)   LDL: 118  (06/20/2008)   LDL Direct: Not documented   HDL: 39  (06/20/2008)   Triglycerides: 274  (06/20/2008)    SGOT (AST): 57  (09/06/2008)   SGPT (ALT): 64  (09/06/2008)   Alkaline phosphatase: 77  (09/06/2008)   Total bilirubin: 0.3  (09/06/2008)    Lipid flowsheet reviewed?: Yes   Progress toward LDL goal: Unchanged  Hypertension   Last Blood Pressure: 141 / 84  (07/04/2009)   Serum creatinine: 0.67  (12/15/2008)   Serum potassium 4.0  (12/15/2008)    Hypertension flowsheet reviewed?: Yes   Progress toward BP goal: Deteriorated  Self-Management Support :   Personal Goals (by the next clinic visit) :     Personal A1C goal: 6  (07/04/2009)     Personal blood pressure goal: 130/80  (07/04/2009)     Personal LDL goal: 100  (07/04/2009)    Patient will work on the following items until the next clinic visit to reach self-care goals:     Medications and monitoring: take my medicines every day, check my blood pressure, examine my feet every day  (07/04/2009)     Eating: eat more vegetables, use fresh or frozen vegetables, eat foods that are low in salt, eat baked foods instead of fried foods, eat fruit for snacks and desserts, limit or avoid alcohol  (07/04/2009)     Activity: take a 30  minute walk every day  (07/04/2009)    Diabetes  self-management support: Resources for patients handout  (07/04/2009)    Hypertension self-management support: Resources for patients handout  (07/04/2009)    Lipid self-management support: Resources for patients handout  (07/04/2009)        Resource handout printed.   Appended Document: Hep B #2 vaccine//kg    Nurse Visit   Allergies: No Known Drug Allergies  Immunizations Administered:  Hepatitis B Vaccine # 2:    Vaccine Type: HepB Adult    Site: right deltoid    Mfr: Merck    Dose: 1.0 ml    Route: IM    Given by: Cynda Familia (AAMA)    Exp. Date: 11/26/2010    Lot #: [id number]    VIS given: 07/31/05 version given July 04, 2009.  Orders Added: 1)  State-Hepatitis B Vaccine Adult IM [90746S] 2)  Admin 1st Vaccine [90471] 3)  Hepatitis B Vaccine >60yrs [90746] 4)  Admin of Any Addtl Vaccine [54098]

## 2010-02-13 NOTE — Progress Notes (Signed)
Summary: med refill/gp  Phone Note Refill Request Message from:  Fax from Pharmacy on May 17, 2009 11:47 AM  Refills Requested: Medication #1:  TRAMADOL HCL 50 MG  TABS 1 by mouth q 6hrs prn   Last Refilled: 04/16/2009  Method Requested: Electronic Initial call taken by: Chinita Pester RN,  May 17, 2009 11:47 AM    Prescriptions: TRAMADOL HCL 50 MG  TABS (TRAMADOL HCL) 1 by mouth q 6hrs prn  #120 x 3   Entered and Authorized by:   Zara Council MD   Signed by:   Zara Council MD on 05/19/2009   Method used:   Electronically to        [facility] [phone]* (retail)       [address]       [place], [place]  [address]       Ph: [phone]       Fax: [phone]   RxID:   [id number]

## 2010-02-13 NOTE — Progress Notes (Signed)
Summary: Soc. Work  Nurse, children's placed by: SW Call placed to: Anna (Hep C Clinic) Summary of Call: Osborne Oman (contact at Hep C) to find out exactly what the Hep C clinic is requiring in terms of a psychological evaluation.   She said that the patient needs to be in counseling for 6 to 12 months and abstain from alcohol completely during that time.  She will then need psychiatric clearance.    Patient had several visits on:   Sept 2010;  April 2011.  Supposed to come back in 6 months for reeval.      Appended Document: Soc. Work Called patient and asked her to call Fam. Services to get counseling process started.   She is to call me back with appointment date and time so I can send cover letter and records to [facility]. [facility].

## 2010-02-13 NOTE — Assessment & Plan Note (Signed)
Summary: 930 AM COU APPT/CH  Anticoagulant Therapy Managed by: Barbera Setters. Janie Morning  PharmD CACP Referring MD: Josem Kaufmann MD, Lyman Bishop PCP: Zara Council MD Indication 1: Pulmonary  embolus Indication 2: Encounter for therapeutic drug monitoring  V58.83 Start date: 05/20/2001 Duration: Indefinite  Patient Assessment Reviewed by: Chancy Milroy PharmD  August 21, 2009 Medication review: verified warfarin dosage & schedule,verified previous prescription medications, verified doses & any changes, verified new medications, reviewed OTC medications, reviewed OTC health products-vitamins supplements etc Complications: none Dietary changes: none   Health status changes: none   Lifestyle changes: none   Recent/future hospitalizations: none   Recent/future procedures: none   Recent/future dental: none Patient Assessment Part 2:  Have you MISSED ANY DOSES or CHANGED TABLETS?  No missed Warfarin doses or changed tablets.  Have you had any BRUISING or BLEEDING ( nose or gum bleeds,blood in urine or stool)?  No reported bruising or bleeding in nose, gums, urine, stool.  Have you STARTED or STOPPED any MEDICATIONS, including OTC meds,herbals or supplements?  No other medications or herbal supplements were started or stopped.  Have you CHANGED your DIET, especially green vegetables,or ALCOHOL intake?  No changes in diet or alcohol intake.  Have you had any ILLNESSES or HOSPITALIZATIONS?  No reported illnesses or hospitalizations  Have you had any signs of CLOTTING?(chest discomfort,dizziness,shortness of breath,arms tingling,slurred speech,swelling or redness in leg)    No chest discomfort, dizziness, shortness of breath, tingling in arm, slurred speech, swelling, or redness in leg.     Treatment  Target INR: 2.0-3.0 INR: 2.2  Date: 08/21/2009 Regimen In:  12mg /wk INR reflects regimen in: 2.2  New  Tablet strength: : 2mg  Regimen Out:     Sunday: 1 Tablet     Monday: 1 Tablet     Tuesday:  1/2 Tablet     Wednesday: 1 Tablet     Thursday: 1 Tablet      Friday: 1/2 Tablet     Saturday: 1 Tablet Total Weekly: 12mg /wk mg  Next INR Due: 09/25/2009 Adjusted by: Barbera Setters. Alexandria Lodge III PharmD CACP   Return to anticoagulation clinic:  09/25/2009 Time of next visit: 0930    Allergies: No Known Drug Allergies

## 2010-02-13 NOTE — Assessment & Plan Note (Signed)
Summary: PATIENT COMING IN AT 10:00AM/SB.  Anticoagulant Therapy Managed by: Barbera Setters. Janie Morning  PharmD CACP Referring MD: Josem Kaufmann MD, Lyman Bishop PCP: Clerance Lav MD Indication 1: Pulmonary  embolus Indication 2: Encounter for therapeutic drug monitoring  V58.83 Start date: 05/20/2001 Duration: Indefinite  Patient Assessment Reviewed by: Chancy Milroy PharmD  October 09, 2009 Medication review: verified warfarin dosage & schedule,verified previous prescription medications, verified doses & any changes, verified new medications, reviewed OTC medications, reviewed OTC health products-vitamins supplements etc Complications: none Dietary changes: none   Health status changes: none   Lifestyle changes: none   Recent/future hospitalizations: none   Recent/future procedures: none   Recent/future dental: none Patient Assessment Part 2:  Have you MISSED ANY DOSES or CHANGED TABLETS?  No missed Warfarin doses or changed tablets.  Have you had any BRUISING or BLEEDING ( nose or gum bleeds,blood in urine or stool)?  No reported bruising or bleeding in nose, gums, urine, stool.  Have you STARTED or STOPPED any MEDICATIONS, including OTC meds,herbals or supplements?  No other medications or herbal supplements were started or stopped.  Have you CHANGED your DIET, especially green vegetables,or ALCOHOL intake?  No changes in diet or alcohol intake.  Have you had any ILLNESSES or HOSPITALIZATIONS?  No reported illnesses or hospitalizations  Have you had any signs of CLOTTING?(chest discomfort,dizziness,shortness of breath,arms tingling,slurred speech,swelling or redness in leg)    No chest discomfort, dizziness, shortness of breath, tingling in arm, slurred speech, swelling, or redness in leg.     Treatment  Target INR: 2.0-3.0 INR: 1.3  Date: 10/09/2009 Regimen In:  12mg /wk INR reflects regimen in: 1.3  New  Tablet strength: : 2mg  Regimen Out:     Sunday: 2 Tablet     Monday: 1  Tablet     Tuesday: 1 Tablet     Wednesday: 1 Tablet     Thursday: 1 Tablet      Friday: 1 Tablet     Saturday: 1 Tablet Total Weekly: 14.0mg /week mg  Next INR Due: 10/23/2009 Adjusted by: Barbera Setters. Alexandria Lodge III PharmD CACP   Return to anticoagulation clinic:  10/23/2009 Time of next visit: 1000    Allergies: No Known Drug Allergies

## 2010-02-13 NOTE — Assessment & Plan Note (Signed)
Summary: 915 AM COU APPT/CH  Anticoagulant Therapy Managed by: Barbera Setters. Janie Morning  PharmD CACP PCP: Zara Council MD [facility] Attending: Margarito Liner MD Indication 1: Pulmonary  embolus Indication 2: Encounter for therapeutic drug monitoring  V58.83 Start date: 05/20/2001 Duration: Indefinite  Patient Assessment Reviewed by: Chancy Milroy PharmD  July 24, 2009 Medication review: verified warfarin dosage & schedule,verified previous prescription medications, verified doses & any changes, verified new medications, reviewed OTC medications, reviewed OTC health products-vitamins supplements etc Complications: none Dietary changes: none   Health status changes: none   Lifestyle changes: none   Recent/future hospitalizations: none   Recent/future procedures: none   Recent/future dental: none Patient Assessment Part 2:  Have you MISSED ANY DOSES or CHANGED TABLETS?  No missed Warfarin doses or changed tablets.  Have you had any BRUISING or BLEEDING ( nose or gum bleeds,blood in urine or stool)?  No reported bruising or bleeding in nose, gums, urine, stool.  Have you STARTED or STOPPED any MEDICATIONS, including OTC meds,herbals or supplements?  No other medications or herbal supplements were started or stopped.  Have you CHANGED your DIET, especially green vegetables,or ALCOHOL intake?  No changes in diet or alcohol intake.  Have you had any ILLNESSES or HOSPITALIZATIONS?  No reported illnesses or hospitalizations  Have you had any signs of CLOTTING?(chest discomfort,dizziness,shortness of breath,arms tingling,slurred speech,swelling or redness in leg)    No chest discomfort, dizziness, shortness of breath, tingling in arm, slurred speech, swelling, or redness in leg.     Treatment  Target INR: 2.0-3.0 INR: 2.8  Date: 07/24/2009 Regimen In:  12.0mg /week INR reflects regimen in: 2.8   Regimen Out:     Sunday: 1 Tablet     Monday: 1 Tablet     Tuesday: 1/2 Tablet     Wednesday:  1 Tablet     Thursday: 1 Tablet      Friday: 1/2 Tablet     Saturday: 1 Tablet Total Weekly: 12mg /wk mg  Next INR Due: 08/21/2009 Adjusted by: Barbera Setters. Alexandria Lodge III PharmD CACP   Return to anticoagulation clinic:  08/21/2009 Time of next visit: 0930    Allergies: No Known Drug Allergies

## 2010-02-13 NOTE — Progress Notes (Signed)
  Phone Note From Other Clinic   Caller: Horatio Pel, Lab Call For: Hulen Luster PharmD CACP Reason for Call: Schedule Patient Appt, Medication Check, Diagnosis Check Request: Talk with Provider, Call Patient Action Taken: Phone Call Completed, Provider Notified, Patient called Details for Reason: Called by French Ana in [facility] reporting INR for patient seen by their PCP today outside of my clinic. Details of Complaint: INR = 3.3 on 14mg /wk warfarin. Details of Action Taken: Called patient and advised to decrease dose to 13.5mg /wk as follows:  Using her 1mg  warfarin tablets--take 2 tablets on ALL DAYS OF week EXCEPT upon Thursdays--take 1 and 1/2 x 1mg  tablet = 13.5mg /wk total. RTC on Monday 6-Jun-11 at 0930h. Summary of Call: Telemanagement of warfarin after patient seen by PCP on 25-May-11. Initial call taken by: Hulen Luster PharmD CACP

## 2010-02-13 NOTE — Assessment & Plan Note (Signed)
Summary: COU/CH  Anticoagulant Therapy Managed by: Barbera Setters. Janie Morning  PharmD CACP Referring MD: Josem Kaufmann MD, Lyman Bishop PCP: Clerance Lav MD [facility] Attending: Coralee Pesa MD, Levada Schilling Indication 1: Pulmonary  embolus Indication 2: Encounter for therapeutic drug monitoring  V58.83 Start date: 05/20/2001 Duration: Indefinite  Patient Assessment Reviewed by: Chancy Milroy PharmD  October 23, 2009 Medication review: verified warfarin dosage & schedule,verified previous prescription medications, verified doses & any changes, verified new medications, reviewed OTC medications, reviewed OTC health products-vitamins supplements etc Complications: hemorrhagic Comments: States she has had bleeding from her gums when taking 2mg  by mouth once daily of warfarin. She states she missed 3 doses last week--not consecutively, but every other day. Dietary changes: none   Health status changes: none   Lifestyle changes: none   Recent/future hospitalizations: none   Recent/future procedures: none   Recent/future dental: none Patient Assessment Part 2:  Have you MISSED ANY DOSES or CHANGED TABLETS?  YES. Missed 3 doses (non-consecutively) last week. INR would have probably been HIGHER today if all doses had been taken. Not sure if she may have "omitted" these doses purposefully-relative to her gingival bleeding.  Have you had any BRUISING or BLEEDING ( nose or gum bleeds,blood in urine or stool)?  No reported bruising or bleeding in nose, gums, urine, stool.  Have you STARTED or STOPPED any MEDICATIONS, including OTC meds,herbals or supplements?  No other medications or herbal supplements were started or stopped.  Have you CHANGED your DIET, especially green vegetables,or ALCOHOL intake?  No changes in diet or alcohol intake.  Have you had any ILLNESSES or HOSPITALIZATIONS?  No reported illnesses or hospitalizations  Have you had any signs of CLOTTING?(chest discomfort,dizziness,shortness of breath,arms  tingling,slurred speech,swelling or redness in leg)    No chest discomfort, dizziness, shortness of breath, tingling in arm, slurred speech, swelling, or redness in leg.     Treatment  Target INR: 2.0-3.0 INR: 2.0  Date: 10/23/2009 Regimen In:  14.0mg /week INR reflects regimen in: 2.0  New  Tablet strength: : 2mg  Regimen Out:     Sunday: 1 Tablet     Monday: 1 Tablet     Tuesday: 1 Tablet     Wednesday: 1/2 Tablet     Thursday: 1 Tablet      Friday: 1 Tablet     Saturday: 1 Tablet Total Weekly: 13.0mg /week mg  Next INR Due: 11/20/2009 Adjusted by: Barbera Setters. Alexandria Lodge III PharmD CACP   Return to anticoagulation clinic:  11/20/2009 Time of next visit: 1000    Allergies: No Known Drug Allergies

## 2010-02-13 NOTE — Letter (Signed)
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 See attached. Recommends psych eval and abstinence from alcohol.   Complete Medication List: 1)  Hydrochlorothiazide 25 Mg Tabs (Hydrochlorothiazide) .... Take 1 tablet by mouth once a day 2)  Xanax 0.25 Mg Tabs (Alprazolam) .... Take 1 tablet by mouth two times a day 3)  Coumadin 2 Mg Tabs (Warfarin sodium) .... Take as directed. 4)  Metformin Hcl 500 Mg Tabs (Metformin hcl) .... Take 1 tablet by mouth once a day 5)  Tramadol Hcl 50 Mg Tabs (Tramadol hcl) .Marland Kitchen.. 1 by mouth q 6hrs prn 6)  Lisinopril 5 Mg Tabs (Lisinopril) .... Take 1 tablet by mouth once a day 7)  Vicodin 5-500 Mg Tabs (Hydrocodone-acetaminophen) .... Please take one pill as needed for pain, not more than four times a day. 8)  Citalopram Hydrobromide 20 Mg Tabs (Citalopram hydrobromide) .... Take 1 tablet by mouth once a day

## 2010-02-13 NOTE — Progress Notes (Signed)
Summary: Refill/gh  Phone Note Refill Request Message from:  Fax from Pharmacy on August 03, 2009 2:14 PM  Refills Requested: Medication #1:  COUMADIN 2 MG TABS Take as directed.   Last Refilled: 05/31/2009  Medication #2:  HYDROCHLOROTHIAZIDE 25 MG  TABS Take 1 tablet by mouth once a day   Last Refilled: 07/03/2009  Method Requested: Electronic Initial call taken by: Angelina Ok RN,  August 03, 2009 2:14 PM  Follow-up for Phone Call       Follow-up by: Clerance Lav MD,  August 03, 2009 9:44 PM    Prescriptions: HYDROCHLOROTHIAZIDE 25 MG  TABS (HYDROCHLOROTHIAZIDE) Take 1 tablet by mouth once a day  #31 x 11   Entered and Authorized by:   Clerance Lav MD   Signed by:   Clerance Lav MD on 08/03/2009   Method used:   Electronically to        [facility] [phone]* (retail)       [address]       [place], [place]  [address]       Ph: [phone]       Fax: [phone]   RxID:   [id number] COUMADIN 2 MG TABS (WARFARIN SODIUM) Take as directed.  #30 x 3   Entered and Authorized by:   Clerance Lav MD   Signed by:   Clerance Lav MD on 08/03/2009   Method used:   Electronically to        [facility] [phone]* (retail)       [address]       [place], [place]  [address]       Ph: [phone]       Fax: [phone]   RxID:   [id number]

## 2010-02-13 NOTE — Progress Notes (Signed)
Summary: refill/gg  Phone Note Refill Request  on November 07, 2009 10:53 AM  Refills Requested: Medication #1:  KLONOPIN 0.5 MG TABS Take one tablet twice daily.   Last Refilled: 09/13/2009  Medication #2:  XANAX 0.25 MG  TABS Take 1 tablet by mouth when needed. Not to exceed one tablet per day. ** pt is asking for 5 more xanax for this month.  Then they will be d/c   Method Requested: Fax to Local Pharmacy Initial call taken by: Merrie Roof RN,  November 07, 2009 10:55 AM  Follow-up for Phone Call        Refill approved-nurse to complete Follow-up by: Clerance Lav MD,  November 08, 2009 11:01 AM    Prescriptions: Prudy Feeler 0.25 MG  TABS (ALPRAZOLAM) Take 1 tablet by mouth when needed. Not to exceed one tablet per day.  #5 x 0   Entered and Authorized by:   Clerance Lav MD   Signed by:   Clerance Lav MD on 11/08/2009   Method used:   Telephoned to ...       [facility] [address] [phone]* (retail)       [address]       [place], [place]  [address]       Ph: [phone]       Fax: [phone]   RxID:   [id number] KLONOPIN 0.5 MG TABS (CLONAZEPAM) Take one tablet twice daily  #60 x 0   Entered and Authorized by:   Clerance Lav MD   Signed by:   Clerance Lav MD on 11/08/2009   Method used:   Telephoned to ...       [facility] [address] [phone]* (retail)       [address]       [place], [place]  [address]       Ph: [phone]       Fax: [phone]   RxID:   [id number]   Appended Document: refill/gg meds called in

## 2010-02-13 NOTE — Miscellaneous (Signed)
Summary: Orders Update  Clinical Lists Changes  Orders: Added new Referral order of Social Work Referral (Social ) - Signed 

## 2010-02-13 NOTE — Progress Notes (Signed)
Summary: Dermatology referral  Phone Note Outgoing Call   Call placed by: Angelina Ok RN,  Jun 08, 2009 3:56 PM Call placed to: Specialist Summary of Call: Call to [facility] to schedule pt for a Dermatology appointment .  They do not have an OPD Clinic for uninsured pts.  The uninsued can go to a Clinic operated by [facility] for free that evaluate pt's for Skin problems.   They were contacted at [phone].  They are located at [address] and see patients on Wednesday pm. on a walk in basis.  Call to the Clinics they do not see pt from [facility]  They see pt's from [facility] as well.  [facility] was called and willonly see pt's if they call themselves to sheldule due to a high no show rate.  Pt was called and informed of what she would  need to do get an appointment at the [facility].  Pt will need to call [phone].  Pt voiced understanding and was given number to call. Angelina Ok RN  Jun 08, 2009 4:03 PM  Initial call taken by: Angelina Ok RN,  Jun 08, 2009 4:03 PM

## 2010-02-13 NOTE — Assessment & Plan Note (Signed)
Summary: Injections    Immunization History:  Hepatitis B Immunization History:    Hepatitis B # 1:  hepb adult (06/07/2009)  Hepatitis A History:    Hepatitis A # 3:  yes (06/07/2009)  Immunizations Administered:  Hepatitis B Vaccine # 2:    Vaccine Type: HepB Adolescent    Site: left deltoid    Mfr: Merck    Dose: 0.1 ml    Route: IM    Given by: Angelina Ok RN    Lot #: [id number]    VIS given: 07/31/05 version given Jun 07, 2009.  Hepatitis A Vaccine # 1:    Vaccine Type: HepA    Site: right deltoid    Mfr: GlaxoSmithKline    Dose: 1.0 ml    Route: IM    Given by: Angelina Ok RN    Lot #: [id number]    VIS given: 04/03/04 version given Jun 07, 2009.

## 2010-02-13 NOTE — Progress Notes (Signed)
Summary: refill/ hla  Phone Note Refill Request Message from:  Patient on September 13, 2009 11:04 AM  Refills Requested: Medication #1:  TRAMADOL HCL 50 MG  TABS 1 by mouth q 6hrs prn   Dosage confirmed as above?Dosage Confirmed   Last Refilled: 7/28 Initial call taken by: Marin Roberts RN,  September 13, 2009 11:04 AM  Follow-up for Phone Call        Rx written Follow-up by: Clerance Lav MD,  September 13, 2009 1:36 PM    Prescriptions: TRAMADOL HCL 50 MG  TABS (TRAMADOL HCL) 1 by mouth q 6hrs prn  #120 x 3   Entered and Authorized by:   Clerance Lav MD   Signed by:   Clerance Lav MD on 09/13/2009   Method used:   Electronically to        [facility] [phone]* (retail)       [address]       [place], [place]  [address]       Ph: [phone]       Fax: [phone]   RxID:   [id number]

## 2010-02-13 NOTE — Progress Notes (Signed)
Summary: refill/gg  Phone Note Refill Request  on April 26, 2009 3:26 PM  Refills Requested: Medication #1:  XANAX 0.25 MG  TABS Take 1 tablet by mouth two times a day   Last Refilled: 03/03/2009  Method Requested: Fax to Local Pharmacy Initial call taken by: Merrie Roof RN,  April 26, 2009 3:27 PM  Additional Follow-up for Phone Call Additional follow up Details #1::        Rx called to pharmacy Additional Follow-up by: Merrie Roof RN,  May 01, 2009 1:16 PM    Prescriptions: XANAX 0.25 MG  TABS (ALPRAZOLAM) Take 1 tablet by mouth two times a day  #30 x 0   Entered and Authorized by:   Zara Council MD   Signed by:   Zara Council MD on 04/27/2009   Method used:   Telephoned to ...       [facility] [address] [phone]* (retail)       [address]       [place], [place]  [address]       Ph: [phone]       Fax: [phone]   RxID:   [id number]

## 2010-02-13 NOTE — Progress Notes (Signed)
Summary: med refill/gp  Phone Note Refill Request Message from:  Fax from Pharmacy on Jun 09, 2009 11:30 AM  Refills Requested: Medication #1:  LISINOPRIL 5 MG TABS Take 1 tablet by mouth once a day   Last Refilled: 04/24/2009  Method Requested: Electronic Initial call taken by: Chinita Pester RN,  Jun 09, 2009 11:30 AM    Prescriptions: LISINOPRIL 5 MG TABS (LISINOPRIL) Take 1 tablet by mouth once a day  #31 x 5   Entered and Authorized by:   Zara Council MD   Signed by:   Zara Council MD on 06/09/2009   Method used:   Electronically to        [facility] [phone]* (retail)       [address]       [place], [place]  [address]       Ph: [phone]       Fax: [phone]   RxID:   [id number]

## 2010-02-13 NOTE — Miscellaneous (Signed)
Summary: KNEE BRACE   Clinical Lists Changes  Orders: Added new Service order of Knee Support Pat cutout [phone]) - Signed  pt called on 05-11-09 and states the knee cutout brace is helping her alot and she's had the current one since june 2010. pt would like to come in and try on another and get one if we have her size, small . pt came into today and i assisted her with the small knee cutout. pt sts she will now go running

## 2010-02-13 NOTE — Progress Notes (Signed)
Summary: refill/ hla  Phone Note Refill Request Message from:  Fax from Pharmacy on February 22, 2009 1:57 PM  Refills Requested: Medication #1:  WARFARIN SODIUM 1 MG TABS Take as directed.   Dosage confirmed as above?Dosage Confirmed   Last Refilled: 1/18 Initial call taken by: Marin Roberts RN,  February 22, 2009 1:57 PM    Prescriptions: WARFARIN SODIUM 1 MG TABS (WARFARIN SODIUM) Take as directed.  #60 x 5   Entered and Authorized by:   Zara Council MD   Signed by:   Zara Council MD on 02/23/2009   Method used:   Electronically to        [facility] [phone]* (retail)       [address]       [place], [place]  [address]       Ph: [phone]       Fax: [phone]   RxID:   [id number]

## 2010-02-13 NOTE — Assessment & Plan Note (Signed)
Summary: est-ck/fu/meds/cfb   Vital Signs:  Patient profile:   50 year old female Height:      67 inches (170.18 cm) Weight:      149.03 pounds (67.74 kg) BMI:     23.43 O2 Sat:      95 % on Room air Temp:     97.5 degrees F (36.39 degrees C) oral Pulse rate:   67 / minute BP sitting:   116 / 79  (right arm)  Vitals Entered By: Angelina Ok RN (Jun 07, 2009 9:17 AM)  O2 Flow:  Room air Is Patient Diabetic? Yes Did you bring your meter with you today? No Pain Assessment Patient in pain? yes      Nutritional Status BMI of 19 -24 = normal CBG Result 111  Does patient need assistance? Functional Status Self care Ambulation Normal, Impaired:Risk for fall Comments Pt wants a Coumadin level.  Has a hurt knee.  Fell 7 days ago.  Pain and swelling.  Bruise on left leg-says has been there a while.    Primary Care Provider:  Zara Council MD   History of Present Illness: Sarah Simon is a 50 year old Female with PMH/problems as outlined in the EMR, who presents to the [facility] with chief complaint(s) of:    1. Hep C: seen by hepatologist recently. Recommends complete abstinence and psych eval. Will make psych referral and follow.  2. HTN/DM: Taking meds as prescribed.  3. 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 Will check INR today.  4. Right knee pain: fell down recently and hurting her right knee, on brace. Doing okay.  5. Skin lesions: Has reddish skin lesions on the chest and back. She was seen by skin doctor, who told it  was skin cancer after removing couple of them. She was given some cream treatment as well. Unfortunately patient is unable to go back to see the same doctor because she can not afford to.  6. Wants refills on xanax.   Preventive Screening-Counseling & Management  Alcohol-Tobacco     Smoking Status: quit     Year Quit: as teenager - just tried and quit  Current Medications (verified): 1)  Hydrochlorothiazide 25 Mg  Tabs (Hydrochlorothiazide) .... Take 1 Tablet  By Mouth Once A Day 2)  Xanax 0.25 Mg  Tabs (Alprazolam) .... Take 1 Tablet By Mouth Two Times A Day 3)  Coumadin 2 Mg Tabs (Warfarin Sodium) .... Take As Directed. 4)  Metformin Hcl 500 Mg Tabs (Metformin Hcl) .... Take 1 Tablet By Mouth Once A Day 5)  Tramadol Hcl 50 Mg  Tabs (Tramadol Hcl) .Marland Kitchen.. 1 By Mouth Q 6hrs Prn 6)  Lisinopril 5 Mg Tabs (Lisinopril) .... Take 1 Tablet By Mouth Once A Day 7)  Vicodin 5-500 Mg Tabs (Hydrocodone-Acetaminophen) .... Please Take One Pill As Needed For Pain, Not More Than Four Times A Day. 8)  Citalopram Hydrobromide 20 Mg Tabs (Citalopram Hydrobromide) .... Take 1 Tablet By Mouth Once A Day  Allergies (verified): No Known Drug Allergies  Past History:  Past Medical History: Last updated: 09/06/2008 PE Factor V Leiden 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 SAH Multiple substance abuse Melanoma Suicidal attempt  Past Surgical History: Last updated: 08/04/2006 Craniotomy Splenectomy  Family History: Last updated: 08/04/2006 Mom had aneurysm in the brain, died at 55s dad died of heart attack at 79s  Social History: Last updated: 08/04/2006 lives with husband and two kids, no job pays for health care out of her own pocket, trying for medicaid as husband  out of job too Former Smoker  Risk Factors: Smoking Status: quit (06/07/2009)  Review of Systems       As per HPI.  Physical Exam  General:  alert and well-developed.   Head:  normocephalic and atraumatic.   Eyes:  vision grossly intact, pupils equal, and pupils round.   Neck:  supple.   Lungs:  normal respiratory effort and normal breath sounds.   Heart:  normal rate and regular rhythm.   Abdomen:  soft and non-tender.   Pulses:  normal peripheral pulses  Extremities:  brace on right knee. Left knee mildly tender, but no swelling or effusion.  Neurologic:  non focal.  Skin:  Multiple reddish vascular appearing lesion on the upper torso and back.  Psych:  Oriented X3 and normally interactive.      Impression & Recommendations:  Problem # 1:  DEPRESSION (ICD-311) Will make psych referral today for counselling as suggested by hepatologist in anticipation of interferon treatment.  Her updated medication list for this problem includes:    Xanax 0.25 Mg Tabs (Alprazolam) .Marland Kitchen... Take 1 tablet by mouth two times a day    Citalopram Hydrobromide 20 Mg Tabs (Citalopram hydrobromide) .Marland Kitchen... Take 1 tablet by mouth once a day  Problem # 2:  SKIN LESION (ICD-709.9) Differential incl melanoma, spider angioma. Will make skin referral.  Orders: Dermatology Referral (Derma)  Problem # 3:  HYPERTENSION (ICD-401.9) Well-controlled. Continue the current regimen.   Her updated medication list for this problem includes:    Hydrochlorothiazide 25 Mg Tabs (Hydrochlorothiazide) .Marland Kitchen... Take 1 tablet by mouth once a day    Lisinopril 5 Mg Tabs (Lisinopril) .Marland Kitchen... Take 1 tablet by mouth once a day  Problem # 4:  DIABETES MELLITUS, TYPE II, MILD (ICD-250.00) No changes today.   Her updated medication list for this problem includes:    Metformin Hcl 500 Mg Tabs (Metformin hcl) .Marland Kitchen... Take 1 tablet by mouth once a day    Lisinopril 5 Mg Tabs (Lisinopril) .Marland Kitchen... Take 1 tablet by mouth once a day  Orders: T- Capillary Blood Glucose (82948) T-Hgb A1C (in-house) (16109UE)  Problem # 5:  HEPATITIS C CARRIER (ICD-V02.62) Hep A and B vaccines series started today. Will need a repeat doses. Advised of the need for absolute abstinence. Will continue to follow with hepatologist.   Problem # 6:  HYPERLIPIDEMIA (ICD-272.4) Continue with life style modifications.   Problem # 7:  DVT (ICD-453.40) INR today, will make appointment with Dr. Alexandria Lodge too.   Orders: T-Protime (in-house) ([id number])  Complete Medication List: 1)  Hydrochlorothiazide 25 Mg Tabs (Hydrochlorothiazide) .... Take 1 tablet by mouth once a day 2)  Xanax 0.25 Mg Tabs (Alprazolam) .... Take 1 tablet by mouth two times a day 3)  Coumadin 2 Mg  Tabs (Warfarin sodium) .... Take as directed. 4)  Metformin Hcl 500 Mg Tabs (Metformin hcl) .... Take 1 tablet by mouth once a day 5)  Tramadol Hcl 50 Mg Tabs (Tramadol hcl) .Marland Kitchen.. 1 by mouth q 6hrs prn 6)  Lisinopril 5 Mg Tabs (Lisinopril) .... Take 1 tablet by mouth once a day 7)  Vicodin 5-500 Mg Tabs (Hydrocodone-acetaminophen) .... Please take one pill as needed for pain, not more than four times a day. 8)  Citalopram Hydrobromide 20 Mg Tabs (Citalopram hydrobromide) .... Take 1 tablet by mouth once a day  Other Orders: Psychiatric Referral (Psych)  Patient Instructions: 1)  Please schedule a follow-up appointment in 3 months. 2)  Please make a follow up  appointment to see Dr. Alexandria Lodge.  Prescriptions: XANAX 0.25 MG  TABS (ALPRAZOLAM) Take 1 tablet by mouth two times a day  #60 x 0   Entered by:   Zara Council MD   Authorized by:   Chancy Milroy PharmD   Signed by:   Zara Council MD on 06/07/2009   Method used:   Print then Give to Patient   RxID:   [id number] VICODIN 5-500 MG TABS (HYDROCODONE-ACETAMINOPHEN) Please take one pill as needed for pain, not more than four times a day.  #25 x 0   Entered by:   Zara Council MD   Authorized by:   Chancy Milroy PharmD   Signed by:   Zara Council MD on 06/07/2009   Method used:   Print then Give to Patient   RxID:   [id number]   Prevention & Chronic Care Immunizations   Influenza vaccine: Not documented   Influenza vaccine deferral: Refused  (11/28/2008)    Tetanus booster: Not documented   Td booster deferral: Refused  (03/03/2009)    Pneumococcal vaccine: Not documented   Pneumococcal vaccine deferral: Refused  (03/03/2009)  Other Screening   Pap smear: Not documented   Pap smear action/deferral: Deferred  (03/03/2009)    Mammogram: ASSESSMENT: Negative - BI-RADS 1^MM DIGITAL SCREENING  (03/27/2009)   Mammogram action/deferral: Ordered  (03/03/2009)   Smoking status: quit  (06/07/2009)  Diabetes Mellitus   HgbA1C:  5.3  (06/07/2009)    Eye exam: No diabetic retinopathy.     (12/14/2008)   Eye exam due: 12/2009    Foot exam: Not documented   Foot exam action/deferral: Do today   High risk foot: No  (03/03/2009)   Foot care education: Not documented   Foot exam due: 03/02/2010    Urine microalbumin/creatinine ratio: 10.0  (06/20/2008)    Diabetes flowsheet reviewed?: Yes   Progress toward A1C goal: At goal  Lipids   Total Cholesterol: 212  (06/20/2008)   LDL: 118  (06/20/2008)   LDL Direct: Not documented   HDL: 39  (06/20/2008)   Triglycerides: 274  (06/20/2008)    SGOT (AST): 57  (09/06/2008)   SGPT (ALT): 64  (09/06/2008)   Alkaline phosphatase: 77  (09/06/2008)   Total bilirubin: 0.3  (09/06/2008)    Lipid flowsheet reviewed?: Yes   Progress toward LDL goal: Unchanged  Hypertension   Last Blood Pressure: 116 / 79  (06/07/2009)   Serum creatinine: 0.67  (12/15/2008)   Serum potassium 4.0  (12/15/2008)    Hypertension flowsheet reviewed?: Yes   Progress toward BP goal: Unchanged  Self-Management Support :    Patient will work on the following items until the next clinic visit to reach self-care goals:     Medications and monitoring: take my medicines every day, bring all of my medications to every visit, examine my feet every day  (06/07/2009)     Eating: drink diet soda or water instead of juice or soda, eat more vegetables, eat foods that are low in salt  (06/07/2009)     Activity: take a 30 minute walk every day  (06/07/2009)    Diabetes self-management support: Written self-care plan, Education handout, Pre-printed educational material  (06/07/2009)   Diabetes care plan printed   Diabetes education handout printed    Hypertension self-management support: Written self-care plan, Education handout, Pre-printed educational material  (06/07/2009)   Hypertension self-care plan printed.   Hypertension education handout printed    Lipid self-management support: Written  self-care plan, Education handout,  Pre-printed educational material  (06/07/2009)   Lipid self-care plan printed.   Lipid education handout printed     Vital Signs:  Patient profile:   50 year old female Height:      67 inches (170.18 cm) Weight:      149.03 pounds (67.74 kg) BMI:     23.43 O2 Sat:      95 % Temp:     97.5 degrees F (36.39 degrees C) oral Pulse rate:   67 / minute BP sitting:   116 / 79  (right arm)  Vitals Entered By: Angelina Ok RN (Jun 07, 2009 9:17 AM)  O2 Flow:  Room air  Laboratory Results   Blood Tests   Date/Time Received: Jun 07, 2009 9:54 AM Date/Time Reported: Alric Quan  Jun 07, 2009 9:54 AM    HGBA1C: 5.3%   (Normal Range: Non-Diabetic - 3-6%   Control Diabetic - 6-8%) CBG Random:: 111mg /dL  INR: 3.3   (Normal Range: 0.88-1.12   Therap INR: 2.0-3.5)

## 2010-02-13 NOTE — Assessment & Plan Note (Signed)
Summary: check up, discuss meds/pcp-Katalyna Socarras/hla   Vital Signs:  Patient profile:   50 year old female Height:      67 inches (170.18 cm) Weight:      144.04 pounds (65.47 kg) BMI:     22.64 Temp:     98.3 degrees F (36.83 degrees C) oral Pulse rate:   77 / minute BP sitting:   131 / 78  (left arm)  Vitals Entered By: Angelina Ok RN (March 03, 2009 10:07 AM) Is Patient Diabetic? Yes Did you bring your meter with you today? No Pain Assessment Patient in pain? yes     Location: knee, shin, back, arm Intensity: 10 Type: aching Onset of pain  Constant Nutritional Status BMI of 19 -24 = normal  Have you ever been in a relationship where you felt threatened, hurt or afraid?No   Does patient need assistance? Functional Status Self care Ambulation Normal Comments Recently stopped drinking.  Needs refills on Coumadin and Xanex.  Tramadol not working.   Takeas 2 at a time with no relief.  Fell 6 weeks ago and bruised leg.   Some reddness and swelling.  Can walk on but painful.  Wants something for premenopause.  Gets spastic and becomes a lunatic once a month   Primary Care Provider:  Zara Council MD   History of Present Illness: 50 yo woman with DM, HTN, HL, hep C and anxiety disorder, h/o PE on prolonged coumadin. In here for a routine follow up. She fell six weeks ago, hurt her left leg, fell in the stair because of the snow. Did not come to the ER, but had a pretty bad bruise. Tramadol not helping with the pain. Wants something stronger for pain relief. She is following with her hepatologist for hep C. Still feels very much stressed at home and is taking xanax regularly, to "calm her down".       Depression History:      The patient denies a depressed mood most of the day and a diminished interest in her usual daily activities.         Preventive Screening-Counseling & Management  Alcohol-Tobacco     Smoking Status: quit     Year Quit: as teenager - just tried and  quit  Current Medications (verified): 1)  Hydrochlorothiazide 25 Mg  Tabs (Hydrochlorothiazide) .... Take 1 Tablet By Mouth Once A Day 2)  Xanax 0.25 Mg  Tabs (Alprazolam) .... Take 1 Tablet By Mouth Three Times A Day 3)  Warfarin Sodium 1 Mg Tabs (Warfarin Sodium) .... Take As Directed. 4)  Metformin Hcl 500 Mg Tabs (Metformin Hcl) .... Take 1 Tablet By Mouth Once A Day 5)  Tramadol Hcl 50 Mg  Tabs (Tramadol Hcl) .Marland Kitchen.. 1 By Mouth Q 6hrs Prn 6)  Lisinopril 5 Mg Tabs (Lisinopril) .... Take 1 Tablet By Mouth Once A Day  Allergies: No Known Drug Allergies  Past History:  Past Medical History: Last updated: 09/06/2008 PE Factor V Leiden 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 SAH Multiple substance abuse Melanoma Suicidal attempt  Past Surgical History: Last updated: 08/04/2006 Craniotomy Splenectomy  Family History: Last updated: 08/04/2006 Mom had aneurysm in the brain, died at 18s dad died of heart attack at 75s  Social History: Last updated: 08/04/2006 lives with husband and two kids, no job pays for health care out of her own pocket, trying for medicaid as husband out of job too Former Smoker  Risk Factors: Smoking Status: quit (03/03/2009)  Review of Systems  Review of System: Negative except per HPI.    Physical Exam  General:  alert and well-developed.  alert and well-developed.   Mouth:  pharynx pink and moist.  pharynx pink and moist.   Neck:  supple.  supple.   Lungs:  normal respiratory effort and normal breath sounds.  normal respiratory effort and normal breath sounds.   Heart:  normal rate and regular rhythm.  normal rate and regular rhythm.   Abdomen:  soft and non-tender.  soft and non-tender.   Pulses:  normal peripheral pulses  Extremities:  no cyanosis, clubbing or edema  left leg, healing laceration,  ~2cm, mildly tender shin.  Neurologic:  non focal.    Impression & Recommendations:  Problem # 1:  FALL, HX OF (ICD-V15.88) Will treat with pain meds for now,  no bony tenderness, i initially wanted to do an x-ray but since it's already been six weeks, i don't think that will show anything.   Problem # 2:  HEPATITIS C CARRIER (ICD-V02.62) Will follow with patient's hepatologist.   Problem # 3:  HYPERLIPIDEMIA (ICD-272.4) Continue with lifestyle changes. Unable to take statin because of liver problem.   Problem # 4:  DIABETES MELLITUS, TYPE II, MILD (ICD-250.00)  Data reviewed: A1c: 5.3 (11/28/2008 1:59:23 PM)  MICROALB/CR: 10.0 (06/20/2008 8:32:00 PM) BP:  /   LDL: 118 (06/20/2008 8:32:00 PM) EYE: No diabetic retinopathy.    (12/14/2008 4:45:42 PM) FOOT:  FOOT RISK:   WEIGHT: 22.64 (03/03/2009 10:05:14 AM)   Foot exam done today. Continue with the current regimen.  Her updated medication list for this problem includes:    Metformin Hcl 500 Mg Tabs (Metformin hcl) .Marland Kitchen... Take 1 tablet by mouth once a day    Lisinopril 5 Mg Tabs (Lisinopril) .Marland Kitchen... Take 1 tablet by mouth once a day  Orders: T- Capillary Blood Glucose (82948) T-Hgb A1C (in-house) (16109UE)  Her updated medication list for this problem includes:    Metformin Hcl 500 Mg Tabs (Metformin hcl) .Marland Kitchen... Take 1 tablet by mouth once a day    Lisinopril 5 Mg Tabs (Lisinopril) .Marland Kitchen... Take 1 tablet by mouth once a day  Problem # 5:  HYPERTENSION (ICD-401.9)  Borderline control but she is in pain. Won't make any changes today.   Her updated medication list for this problem includes:    Hydrochlorothiazide 25 Mg Tabs (Hydrochlorothiazide) .Marland Kitchen... Take 1 tablet by mouth once a day    Lisinopril 5 Mg Tabs (Lisinopril) .Marland Kitchen... Take 1 tablet by mouth once a day  Her updated medication list for this problem includes:    Hydrochlorothiazide 25 Mg Tabs (Hydrochlorothiazide) .Marland Kitchen... Take 1 tablet by mouth once a day    Lisinopril 5 Mg Tabs (Lisinopril) .Marland Kitchen... Take 1 tablet by mouth once a day  Problem # 6:  ADJUSTMENT DISORDER WITH DEPRESSED MOOD (ICD-309.0) She is having to take xanax three  times a day. I think she needs something long-term to help with her anxiety and depression. Will start her on celexa 20 mg daily, available in [facility] for $4. Will change xanax to twice a day.   Problem # 7:  PULMONARY EMBOLISM (ICD-415.19)  Chr coumadin per Dr. Alexandria Lodge.   Her updated medication list for this problem includes:    Coumadin 2 Mg Tabs (Warfarin sodium) .Marland Kitchen... Take as directed.  Her updated medication list for this problem includes:    Coumadin 2 Mg Tabs (Warfarin sodium) .Marland Kitchen... Take as directed.  Complete Medication List: 1)  Hydrochlorothiazide 25 Mg Tabs (  Hydrochlorothiazide) .... Take 1 tablet by mouth once a day 2)  Xanax 0.25 Mg Tabs (Alprazolam) .... Take 1 tablet by mouth two times a day 3)  Coumadin 2 Mg Tabs (Warfarin sodium) .... Take as directed. 4)  Metformin Hcl 500 Mg Tabs (Metformin hcl) .... Take 1 tablet by mouth once a day 5)  Tramadol Hcl 50 Mg Tabs (Tramadol hcl) .Marland Kitchen.. 1 by mouth q 6hrs prn 6)  Lisinopril 5 Mg Tabs (Lisinopril) .... Take 1 tablet by mouth once a day 7)  Vicodin 5-500 Mg Tabs (Hydrocodone-acetaminophen) .... Please take one pill as needed for pain, not more than four times a day. 8)  Citalopram Hydrobromide 20 Mg Tabs (Citalopram hydrobromide) .... Take 1 tablet by mouth once a day  Other Orders: Radiology other (Radiology Other)  Patient Instructions: 1)  Please schedule a follow-up appointment in 3 months. 2)  We will let you know if anything wrong with your lab work.   Prescriptions: XANAX 0.25 MG  TABS (ALPRAZOLAM) Take 1 tablet by mouth two times a day  #30 x 0   Entered and Authorized by:   Zara Council MD   Signed by:   Zara Council MD on 03/03/2009   Method used:   Print then Give to Patient   RxID:   [id number] CITALOPRAM HYDROBROMIDE 20 MG TABS (CITALOPRAM HYDROBROMIDE) Take 1 tablet by mouth once a day  #30 x 3   Entered and Authorized by:   Zara Council MD   Signed by:   Zara Council MD on 03/03/2009   Method  used:   Print then Give to Patient   RxID:   [id number] COUMADIN 2 MG TABS (WARFARIN SODIUM) Take as directed.  #30 x 3   Entered and Authorized by:   Zara Council MD   Signed by:   Zara Council MD on 03/03/2009   Method used:   Print then Give to Patient   RxID:   [id number] VICODIN 5-500 MG TABS (HYDROCODONE-ACETAMINOPHEN) Please take one pill as needed for pain, not more than four times a day.  #25 x 0   Entered and Authorized by:   Zara Council MD   Signed by:   Zara Council MD on 03/03/2009   Method used:   Print then Give to Patient   RxID:   [id number]   Prevention & Chronic Care Immunizations   Influenza vaccine: Not documented   Influenza vaccine deferral: Refused  (11/28/2008)    Tetanus booster: Not documented   Td booster deferral: Refused  (03/03/2009)    Pneumococcal vaccine: Not documented   Pneumococcal vaccine deferral: Refused  (03/03/2009)  Other Screening   Pap smear: Not documented   Pap smear action/deferral: Deferred  (03/03/2009)    Mammogram: Not documented   Mammogram action/deferral: Ordered  (03/03/2009)   Smoking status: quit  (03/03/2009)  Diabetes Mellitus   HgbA1C: 5.3  (11/28/2008)    Eye exam: No diabetic retinopathy.     (12/14/2008)   Eye exam due: 12/2009    Foot exam: Not documented   Foot exam action/deferral: Do today   High risk foot: No  (03/03/2009)   Foot care education: Not documented   Foot exam due: 03/02/2010    Urine microalbumin/creatinine ratio: 10.0  (06/20/2008)    Diabetes flowsheet reviewed?: Yes   Progress toward A1C goal: At goal  Lipids   Total Cholesterol: 212  (06/20/2008)   LDL: 118  (06/20/2008)   LDL Direct: Not documented   HDL: 39  (  06/20/2008)   Triglycerides: 274  (06/20/2008)    SGOT (AST): 57  (09/06/2008)   SGPT (ALT): 64  (09/06/2008)   Alkaline phosphatase: 77  (09/06/2008)   Total bilirubin: 0.3  (09/06/2008)    Lipid flowsheet reviewed?: Yes   Progress  toward LDL goal: Unchanged  Hypertension   Last Blood Pressure: 131 / 78  (03/03/2009)   Serum creatinine: 0.67  (12/15/2008)   Serum potassium 4.0  (12/15/2008)    Hypertension flowsheet reviewed?: Yes   Progress toward BP goal: Unchanged  Self-Management Support :    Patient will work on the following items until the next clinic visit to reach self-care goals:     Medications and monitoring: take my medicines every day, bring all of my medications to every visit, examine my feet every day  (03/03/2009)     Eating: drink diet soda or water instead of juice or soda, eat more vegetables, eat foods that are low in salt  (03/03/2009)     Activity: take a 30 minute walk every day  (03/03/2009)    Diabetes self-management support: Not documented    Hypertension self-management support: Not documented    Lipid self-management support: Not documented      Vital Signs:  Patient profile:   50 year old female Height:      67 inches (170.18 cm) Weight:      144.04 pounds (65.47 kg) BMI:     22.64 Temp:     98.3 degrees F (36.83 degrees C) oral Pulse rate:   77 / minute BP sitting:   131 / 78  (left arm)  Vitals Entered By: Angelina Ok RN (March 03, 2009 10:07 AM)       Last LDL:                                                 118 (06/20/2008 8:32:00 PM)        Diabetic Foot Exam Foot Inspection Is there a history of a foot ulcer?              No Is there a foot ulcer now?              No Can the patient see the bottom of their feet?          Yes Are the shoes appropriate in style and fit?          Yes Is there swelling or an abnormal foot shape?          No Are the toenails long?                No Are the toenails thick?                No Are the toenails ingrown?              No Is there heavy callous build-up?              No Is there a claw toe deformity?                          No Is there elevated skin temperature?            No Is there limited ankle  dorsiflexion?  No Is there foot or ankle muscle weakness?            No Do you have pain in calf while walking?           No       High Risk Feet? No Set Next Diabetic Foot Exam here: 03/02/2010   10-g (5.07) Semmes-Weinstein Monofilament Test Performed by: Zara Council MD          Right Foot          Left Foot Site 1         normal         normal Site 4         normal         normal Site 5         normal         normal Site 6         normal         normal  Appended Document: Lab Order/a1c results    Lab Visit  Laboratory Results   Blood Tests   Date/Time Received: March 03, 2009 11:23 AM Date/Time Reported: Alric Quan  March 03, 2009 11:23 AM  HGBA1C: 5.4%   (Normal Range: Non-Diabetic - 3-6%   Control Diabetic - 6-8%) CBG Random:: 227mg /dL    Orders Today:

## 2010-02-13 NOTE — Assessment & Plan Note (Signed)
Summary: COU/ PATIENT COMING AT 9:00/SB.  Anticoagulant Therapy Managed by: Barbera Setters. Sarah Simon  PharmD CACP Referring MD: Josem Kaufmann MD, Lyman Bishop PCP: Clerance Lav MD Indication 1: Pulmonary  embolus Indication 2: Encounter for therapeutic drug monitoring  V58.83 Start date: 05/20/2001 Duration: Indefinite  Patient Assessment Reviewed by: Chancy Milroy PharmD  November 27, 2009 Medication review: verified warfarin dosage & schedule,verified previous prescription medications, verified doses & any changes, verified new medications, reviewed OTC medications, reviewed OTC health products-vitamins supplements etc Complications: none Dietary changes: none   Health status changes: none   Lifestyle changes: none   Recent/future hospitalizations: none   Recent/future procedures: none   Recent/future dental: none Patient Assessment Part 2:  Have you MISSED ANY DOSES or CHANGED TABLETS?  YES. States she has missed "a couple of doses" within past week.  Have you had any BRUISING or BLEEDING ( nose or gum bleeds,blood in urine or stool)?  No reported bruising or bleeding in nose, gums, urine, stool.  Have you STARTED or STOPPED any MEDICATIONS, including OTC meds,herbals or supplements?  No other medications or herbal supplements were started or stopped.  Have you CHANGED your DIET, especially green vegetables,or ALCOHOL intake?  No changes in diet or alcohol intake.  Have you had any ILLNESSES or HOSPITALIZATIONS?  No reported illnesses or hospitalizations  Have you had any signs of CLOTTING?(chest discomfort,dizziness,shortness of breath,arms tingling,slurred speech,swelling or redness in leg)    No chest discomfort, dizziness, shortness of breath, tingling in arm, slurred speech, swelling, or redness in leg.     Treatment  Target INR: 2.0-3.0 INR: 1.5  Date: 11/27/2009 Regimen In:  13.0mg /week INR reflects regimen in: 1.5  New  Tablet strength: : 2mg  Regimen Out:     Sunday: 1  Tablet     Monday: 1 Tablet     Tuesday: 1 Tablet     Wednesday: 1 & 1/2 Tablet     Thursday: 1 Tablet      Friday: 1 Tablet     Saturday: 1 Tablet Total Weekly: 15.0mg /week mg  Next INR Due: 12/11/2009 Adjusted by: Barbera Setters. Alexandria Lodge III PharmD CACP   Return to anticoagulation clinic:  12/11/2009 Time of next visit: 0930    Allergies: No Known Drug Allergies

## 2010-02-13 NOTE — Assessment & Plan Note (Signed)
Summary: per dr Sherryll Burger, med review/pcp-shah/hla   Vital Signs:  Patient profile:   50 year old female Height:      67 inches (170.18 cm) Weight:      146.3 pounds (66.50 kg) BMI:     23.00 Temp:     97.6 degrees F (36.44 degrees C) oral Pulse rate:   75 / minute BP sitting:   126 / 78  (left arm) Cuff size:   large  Vitals Entered By: Theotis Barrio NT II (September 13, 2009 8:43 AM)  CC: ROUTINE OFFICE VISIT WITH MEDICATION REFILL  / BACK AND LEFT KNEE PAIN , Depression Is Patient Diabetic? Yes Did you bring your meter with you today? DON'T USE A METER Pain Assessment Patient in pain? yes     Location: LEFT KNEE/ BACK Intensity:      3 Type: DULL / VERY SHARP Onset of pain  BACK PAIN FOR 6 MONTHS OR MORE  /  FOR ABOUT 2 YEARS  Nutritional Status BMI of 19 -24 = normal CBG Result 170  Have you ever been in a relationship where you felt threatened, hurt or afraid?No   Does patient need assistance? Functional Status Self care Ambulation Normal   Primary Care Felton Buczynski:  Clerance Lav MD  CC:  ROUTINE OFFICE VISIT WITH MEDICATION REFILL  / BACK AND LEFT KNEE PAIN  and Depression.  History of Present Illness: Sarah Simon is a 50 year old Female with PMH/problems as outlined in the EMR, who presents to the [facility] before I could fill her xanax. :    1. DM/HTN: no new complaints, taking meds as prescribed. 2. Hep C: following with hep clinic, they wanted psych review before starting treatment. She is also getting hep A and hep B vaccines with [place]. Due for dose 2 of hep B. 3. Knee pain: still bothering as before, taking tramadol as needed. 4. Depression: not taking celexa, she uses xanax for anxiety.   Her anxiety is generalised and not specific. She feels xanax works well and allows her to sleep through the night without trboule. She also reports consuming 1-3 beers a day. She does not need it to wake up in the morning. She does not drive. She has no other complains.      Depression History:      The patient denies a depressed mood most of the day and a diminished interest in her usual daily activities.         Preventive Screening-Counseling & Management  Alcohol-Tobacco     Alcohol drinks/day: 3     Alcohol type: beer     Alcohol Counseling: to decrease amount and/or frequency of alcohol intake     Feels need to cut down: no     Feels annoyed by complaints: no     Feels guilty re: drinking: no     Needs 'eye opener' in am: no     Smoking Status: quit     Year Quit: as teenager - just tried and quit  Caffeine-Diet-Exercise     Does Patient Exercise: yes     Type of exercise: WALKING     Exercise (avg: min/session): 30-60  Allergies (verified): No Known Drug Allergies  Past History:  Past Medical History: Last updated: 09/06/2008 PE Factor V Leiden 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 SAH Multiple substance abuse Melanoma Suicidal attempt  Past Surgical History: Last updated: 08/04/2006 Craniotomy Splenectomy  Family History: Last updated: 08/04/2006 Mom had aneurysm in the brain, died at 57s dad died  of heart attack at 12s  Social History: Last updated: 08/04/2006 lives with husband and two kids, no job pays for health care out of her own pocket, trying for medicaid as husband out of job too Former Smoker  Risk Factors: Alcohol Use: 3 (09/13/2009) Exercise: yes (09/13/2009)  Risk Factors: Smoking Status: quit (09/13/2009)  Review of Systems      See HPI  Physical Exam  General:  alert and well-developed.   Head:  normocephalic and atraumatic.   Eyes:  vision grossly intact, pupils equal, and pupils round.   Mouth:  pharynx pink and moist.  pharynx pink and moist.   Neck:  supple.   Lungs:  normal respiratory effort and normal breath sounds.   Heart:  normal rate and regular rhythm.   Abdomen:  soft and non-tender.   Msk:  Right knee on brace, mildly tender bilaterally, no swelling, redness Neurologic:  non focal.     Impression & Recommendations:  Problem # 1:  ANXIETY, CHRONIC (ICD-300.00) I will start her on Klonapin with goal to completely be off the xanax. She will need psych eval for continued meds. She may also benefit from SSRI. AT this time she is not willing to start it. But this should definately be considered as her symptoms are mix of depression and anxiety.  Her updated medication list for this problem includes:    Xanax 0.25 Mg Tabs (Alprazolam) .Marland Kitchen... Take 1 tablet by mouth when needed. not to exceed one tablet per day.    Klonopin 0.5 Mg Tabs (Clonazepam) .Marland Kitchen... Take one tablet twice daily  Orders: Psychiatric Referral (Psych)  Problem # 2:  DIABETES MELLITUS, TYPE II, MILD (ICD-250.00) I doubt she is diabetic although her CBG is high this AM after breakfast. She has no symptoms and her A1C is 5. I will take her off and reconsider it in 3 months if symptomatic or A1C is >6.  The following medications were removed from the medication list:    Metformin Hcl 500 Mg Tabs (Metformin hcl) .Marland Kitchen... Take 1 tablet by mouth once a day Her updated medication list for this problem includes:    Lisinopril 5 Mg Tabs (Lisinopril) .Marland Kitchen... Take 1 tablet by mouth once a day  Orders: T-Hgb A1C (in-house) ([id number]) T-Urine Microalbumin w/creat. ratio [phone]) T- Capillary Blood Glucose (47425)  Labs Reviewed: Creat: 0.57 (07/04/2009)     Last Eye Exam: No diabetic retinopathy.    (12/14/2008) Reviewed HgBA1c results: 5.0 (09/13/2009)  5.3 (06/07/2009)  Problem # 3:  DEPRESSION (ICD-311) No SI or HI. At present not severe enough to warrant therapy. She will need psych eval for anxiety. She has alcohol use which needs to be addressed. I have asked her to cut down.  Her updated medication list for this problem includes:    Xanax 0.25 Mg Tabs (Alprazolam) .Marland Kitchen... Take 1 tablet by mouth when needed. not to exceed one tablet per day.    Klonopin 0.5 Mg Tabs (Clonazepam) .Marland Kitchen... Take one tablet  twice daily  Orders: Psychiatric Referral (Psych)  Problem # 4:  HEPATITIS C CARRIER (ICD-V02.62) I am not sure of her viral count. She is going to hep c clinic but not on therapy yet.   Problem # 5:  DVT (ICD-453.40) Hx of multiple DVT and PE. Continues to be on warfarin and INR is therapeutic according to her.   Complete Medication List: 1)  Hydrochlorothiazide 25 Mg Tabs (Hydrochlorothiazide) .... Take 1 tablet by mouth once a day 2)  Xanax 0.25  Mg Tabs (Alprazolam) .... Take 1 tablet by mouth when needed. not to exceed one tablet per day. 3)  Coumadin 2 Mg Tabs (Warfarin sodium) .... Take as directed. 4)  Tramadol Hcl 50 Mg Tabs (Tramadol hcl) .Marland Kitchen.. 1 by mouth q 6hrs prn 5)  Lisinopril 5 Mg Tabs (Lisinopril) .... Take 1 tablet by mouth once a day 6)  Vicodin 5-500 Mg Tabs (Hydrocodone-acetaminophen) .... Please take one pill as needed for pain, not more than four times a day. 7)  Klonopin 0.5 Mg Tabs (Clonazepam) .... Take one tablet twice daily  Patient Instructions: 1)  Please schedule a follow-up appointment in 1 month. 2)  Stop taking metformin. 3)  Now you are on a new medicine KLONAPIN or Cloanazepam.  4)  Xanax is only if having severe anxiety. Not to exceed >1 a day.  Prescriptions: XANAX 0.25 MG  TABS (ALPRAZOLAM) Take 1 tablet by mouth when needed. Not to exceed one tablet per day.  #10 x 0   Entered and Authorized by:   Clerance Lav MD   Signed by:   Clerance Lav MD on 09/13/2009   Method used:   Print then Give to Patient   RxID:   [id number] KLONOPIN 0.5 MG TABS (CLONAZEPAM) Take one tablet twice daily  #60 x 0   Entered and Authorized by:   Clerance Lav MD   Signed by:   Clerance Lav MD on 09/13/2009   Method used:   Print then Give to Patient   RxID:   [id number]  Process Orders Check Orders Results:     [facility]: ABN not required for this insurance Tests Sent for requisitioning (September 13, 2009 1:35 PM):     09/13/2009:  [facility] -- T-Urine Microalbumin w/creat. ratio [[id number]] (signed)     Prevention & Chronic Care Immunizations   Influenza vaccine: Not documented   Influenza vaccine deferral: Refused  (11/28/2008)    Tetanus booster: Not documented   Td booster deferral: Refused  (03/03/2009)    Pneumococcal vaccine: Not documented   Pneumococcal vaccine deferral: Refused  (03/03/2009)  Other Screening   Pap smear: Not documented   Pap smear action/deferral: Deferred  (03/03/2009)    Mammogram: ASSESSMENT: Negative - BI-RADS 1^MM DIGITAL SCREENING  (03/27/2009)   Mammogram action/deferral: Ordered  (03/03/2009)   Smoking status: quit  (09/13/2009)  Diabetes Mellitus   HgbA1C: 5.0  (09/13/2009)   HgbA1C action/deferral: Ordered  (09/13/2009)    Eye exam: No diabetic retinopathy.     (12/14/2008)   Eye exam due: 12/2009    Foot exam: Not documented   Foot exam action/deferral: Do today   High risk foot: No  (03/03/2009)   Foot care education: Not documented   Foot exam due: 03/02/2010    Urine microalbumin/creatinine ratio: 10.0  (06/20/2008)   Urine microalbumin action/deferral: Ordered    Diabetes flowsheet reviewed?: Yes   Progress toward A1C goal: At goal  Lipids   Total Cholesterol: 212  (06/20/2008)   LDL: 118  (06/20/2008)   LDL Direct: Not documented   HDL: 39  (06/20/2008)   Triglycerides: 274  (06/20/2008)    SGOT (AST): 57  (09/06/2008)   SGPT (ALT): 64  (09/06/2008)   Alkaline phosphatase: 77  (09/06/2008)   Total bilirubin: 0.3  (09/06/2008)    Lipid flowsheet reviewed?: Yes   Progress toward LDL goal: At goal  Hypertension   Last Blood Pressure: 126 / 78  (09/13/2009)   Serum creatinine: 0.57  (07/04/2009)  Serum potassium 3.9  (07/04/2009)    Hypertension flowsheet reviewed?: Yes   Progress toward BP goal: At goal  Self-Management Support :   Personal Goals (by the next clinic visit) :     Personal A1C goal: 6   (07/04/2009)     Personal blood pressure goal: 130/80  (07/04/2009)     Personal LDL goal: 130  (09/13/2009)    Patient will work on the following items until the next clinic visit to reach self-care goals:     Medications and monitoring: take my medicines every day, examine my feet every day  (09/13/2009)     Eating: drink diet soda or water instead of juice or soda, eat more vegetables, use fresh or frozen vegetables, eat foods that are low in salt, eat baked foods instead of fried foods, eat fruit for snacks and desserts, limit or avoid alcohol  (09/13/2009)     Activity: take a 30 minute walk every day, take the stairs instead of the elevator, park at the far end of the parking lot, join a walking program  (09/13/2009)    Diabetes self-management support: Written self-care plan  (09/13/2009)   Diabetes care plan printed    Hypertension self-management support: Written self-care plan  (09/13/2009)   Hypertension self-care plan printed.    Lipid self-management support: Written self-care plan  (09/13/2009)   Lipid self-care plan printed.   Nursing Instructions: HgbA1C today (see order) Diabetic foot exam today   Laboratory Results   Blood Tests   Date/Time Received: September 13, 2009 9:07 AM  Date/Time Reported: Burke Keels  September 13, 2009 9:08 AM   HGBA1C: 5.0%   (Normal Range: Non-Diabetic - 3-6%   Control Diabetic - 6-8%) CBG Random:: 170mg /dL

## 2010-02-13 NOTE — Miscellaneous (Signed)
Summary: Phone Consult  20 min.  Phone contact.    The patient  informs me that she needs to have a psychological/psychiatric eval before she can get treatment at the [facility].  Apparently she tried to kill herself ten years ago by cutting her wrist and she was also a substance user.    Her husband works sporadically and she reports that the family has alot of debt.  She is not working and has explored disability but doesn't have enough points to qualify for disability due to a problem that arose from her not changing her name to her married name until last year.  She is trying to resolve that issue.   The patient also tells me that she cancelled her Hep C appmt twice and that our doctors are trying to get her back in there.   I spoke with the patient about the benefits of getting connected to psychiatry and counseling but right now the issue appears to be getting a psychological to get back into the [facility] Clinic.  I told the patient I would consult with Dr. Sherryll Burger and/or the [facility] to find out specifically what needs to be addressed in the psychological report and whether she could be evaluated by a master's level clinician.

## 2010-02-15 NOTE — Assessment & Plan Note (Signed)
Summary: COU/CH  Anticoagulant Therapy Managed by: Barbera Setters. Janie Morning  PharmD CACP Referring MD: Josem Kaufmann MD, Lyman Bishop PCP: Clerance Lav MD [facility] Attending: Margarito Liner MD Indication 1: Pulmonary  embolus Indication 2: Encounter for therapeutic drug monitoring  V58.83 Start date: 05/20/2001 Duration: Indefinite  Patient Assessment Reviewed by: Chancy Milroy PharmD  December 25, 2009 Medication review: verified warfarin dosage & schedule,verified previous prescription medications, verified doses & any changes, verified new medications, reviewed OTC medications, reviewed OTC health products-vitamins supplements etc Complications: none Dietary changes: none   Health status changes: none   Lifestyle changes: none   Recent/future hospitalizations: none   Recent/future procedures: none   Recent/future dental: none Patient Assessment Part 2:  Have you MISSED ANY DOSES or CHANGED TABLETS?  No missed Warfarin doses or changed tablets.  Have you had any BRUISING or BLEEDING ( nose or gum bleeds,blood in urine or stool)?  No reported bruising or bleeding in nose, gums, urine, stool.  Have you STARTED or STOPPED any MEDICATIONS, including OTC meds,herbals or supplements?  No other medications or herbal supplements were started or stopped.  Have you CHANGED your DIET, especially green vegetables,or ALCOHOL intake?  No changes in diet or alcohol intake.  Have you had any ILLNESSES or HOSPITALIZATIONS?  No reported illnesses or hospitalizations  Have you had any signs of CLOTTING?(chest discomfort,dizziness,shortness of breath,arms tingling,slurred speech,swelling or redness in leg)    No chest discomfort, dizziness, shortness of breath, tingling in arm, slurred speech, swelling, or redness in leg.     Treatment  Target INR: 2.0-3.0 INR: 2.1  Date: 12/25/2009 Regimen In:  13.0mg /week INR reflects regimen in: 2.1  New  Tablet strength: : 2mg  Regimen Out:     Sunday: 2 Tablet  Monday: 1 Tablet     Tuesday: 1 Tablet     Wednesday: 1 Tablet     Thursday: 1 Tablet      Friday: 1 Tablet     Saturday: 1 Tablet Total Weekly: 14.0mg /week mg  Next INR Due: 01/22/2010 Adjusted by: Barbera Setters. Alexandria Lodge III PharmD CACP   Return to anticoagulation clinic:  01/22/2010 Time of next visit: 1000    Allergies: No Known Drug Allergies

## 2010-02-15 NOTE — Assessment & Plan Note (Signed)
Summary: NEED MEDICATION / SB.   Vital Signs:  Patient profile:   50 year old female Height:      67 inches (170.18 cm) Weight:      152.9 pounds (69.50 kg) BMI:     24.03 Temp:     97.9 degrees F (36.61 degrees C) oral Pulse rate:   64 / minute Pulse (ortho):   68 / minute BP sitting:   146 / 90  (right arm) BP standing:   140 / 92  Vitals Entered By: Cynda Familia Duncan Dull) (January 10, 2010 2:26 PM)  Serial Vital Signs/Assessments:  Time      Position  BP       Pulse  Resp  Temp     By           Lying RA  150/91   66                    Kaye Goldston,CMA (AAMA)           Standing  140/92   68                    Kaye Goldston,CMA (AAMA)  CC: MED REFILL ON COUMADIN  (APPT JAN 6TH) AND TRAMADOL, WANTS MD TO LOOK AT "INFECTED" AREA ON BACK WERE A MOlE WAS REMOVED, REQUEST RX FOR KLONOPIN Is Patient Diabetic? No Research Study Name: Pt states she is no longer a diabetic Pain Assessment Patient in pain? yes     Location: LEFT KNEE Intensity: 10 Type: sharp/DULL Onset of pain  S/P FALL 6 DAYS AGO Nutritional Status BMI of 19 -24 = normal  Have you ever been in a relationship where you felt threatened, hurt or afraid?No   Does patient need assistance? Functional Status Self care Ambulation Normal    Primary Care Provider:  Clerance Lav MD  CC:  MED REFILL ON COUMADIN  (APPT JAN 6TH) AND TRAMADOL, WANTS MD TO LOOK AT "INFECTED" AREA ON BACK WERE A MOlE WAS REMOVED, and REQUEST RX FOR KLONOPIN.  History of Present Illness: 1. Follow up appointment. Requests refills on Klonopin and Warfarin. 2. c. left knee pain status post fall 1 week ago. Patient states that was carrying dishes while going over a short fence; slipped and landed on her left knee on a gravel. Denies any fever, chills, SOB; no radiculopathy or leg swelling; pain is sharp and induced with bearing weight and palpation.  Depression History:      The patient denies a depressed mood most of the day and a  diminished interest in her usual daily activities.         Preventive Screening-Counseling & Management  Alcohol-Tobacco     Alcohol drinks/day: 3     Alcohol type: beer     Alcohol Counseling: to decrease amount and/or frequency of alcohol intake     Feels need to cut down: no     Feels annoyed by complaints: no     Feels guilty re: drinking: no     Needs 'eye opener' in am: no     Smoking Status: quit     Year Quit: as teenager - just tried and quit  Problems Prior to Update: 1)  Anxiety, Chronic  (ICD-300.00) 2)  Depression  (ICD-311) 3)  Skin Lesion  (ICD-709.9) 4)  Hypertension  (ICD-401.9) 5)  Diabetes Mellitus, Type II, Mild  (ICD-250.00) 6)  Hepatitis C Carrier  (ICD-V02.62) 7)  Hyperlipidemia  (ICD-272.4) 8)  Dvt  (ICD-453.40) 9)  Pulmonary Embolism  (ICD-415.19) 10)  Peripheral Vascular Disease  (ICD-443.9) 11)  Fall, Hx of  (ICD-V15.88) 12)  Meniscus Tear  (ICD-836.2) 13)  Health Maintenance Exam  (ICD-V70.0) 14)  Transaminases, Serum, Elevated  (ICD-790.4) 15)  Baker's Cyst, Right Knee  (ICD-727.51) 16)  Back Pain  (ICD-724.5) 17)  Knee Pain  (ICD-719.46) 18)  Adjustment Disorder With Depressed Mood  (ICD-309.0) 19)  Melanoma  (ICD-172.9) 20)  Gerd  (ICD-530.81) 21)  Dizziness  (ICD-780.4) 22)  Ankle Pain, Right  (ICD-719.47) 23)  Elbow Pain, Left  (ICD-719.42) 24)  Chest Pain  (ICD-786.50)  Current Medications (verified): 1)  Hydrochlorothiazide 25 Mg  Tabs (Hydrochlorothiazide) .... Take 1 Tablet By Mouth Once A Day 2)  Coumadin 2 Mg Tabs (Warfarin Sodium) .... Take As Directed. 3)  Tramadol Hcl 50 Mg  Tabs (Tramadol Hcl) .Marland Kitchen.. 1 By Mouth Q 6hrs Prn 4)  Lisinopril 5 Mg Tabs (Lisinopril) .... Take 1 Tablet By Mouth Once A Day 5)  Vicodin 5-500 Mg Tabs (Hydrocodone-Acetaminophen) .... Please Take One Pill As Needed For Pain, Not More Than Four Times A Day. 6)  Klonopin 0.5 Mg Tabs (Clonazepam) .... Take One Tablet Twice Daily 7)  Doxycycline Hyclate 100 Mg  Tabs (Doxycycline Hyclate) .... Take One Tablet By Mouth Two Times A Day For 7 Days With Meals  Allergies (verified): No Known Drug Allergies  Past History:  Past medical, surgical, family and social histories (including risk factors) reviewed for relevance to current acute and chronic problems.  Past Medical History: Reviewed history from 09/06/2008 and no changes required. PE Factor V Leiden 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 SAH Multiple substance abuse Melanoma Suicidal attempt  Past Surgical History: Reviewed history from 08/04/2006 and no changes required. Craniotomy Splenectomy  Family History: Reviewed history from 08/04/2006 and no changes required. Mom had aneurysm in the brain, died at 10s dad died of heart attack at 1s  Social History: Reviewed history from 08/04/2006 and no changes required. lives with husband and two kids, no job pays for health care out of her own pocket, trying for medicaid as husband out of job too Former Smoker  Physical Exam  General:  alert and well-developed.   Head:  normocephalic and atraumatic.   Mouth:  pharynx pink and moist.  pharynx pink and moist.   Neck:  supple.   Lungs:  normal respiratory effort and normal breath sounds.   Heart:  normal rate and regular rhythm.   Abdomen:  soft and non-tender.   Msk:  left LE with erithematous and mildly tender lesion approximately 5 cm in diameter in lateral infrapetallar aspect of her left knee,  which is edematous, erythematous, warm to touch and acutely TTP. FROM of all joints without any crepitus. Pulses:  normal peripheral pulses  Extremities:  no cyanosis, clubbing or edema  Neurologic:  non focal.  Skin:  Multiple reddish vascular appearing lesion on the upper torso and back.  Psych:  Oriented X3 and normally interactive.    Diabetes Management Exam:    Foot Exam (with socks and/or shoes not present):       Sensory-Pinprick/Light touch:          Left medial foot (L-4): normal          Left  dorsal foot (L-5): normal          Left lateral foot (S-1): normal          Right medial foot (L-4): normal  Right dorsal foot (L-5): normal          Right lateral foot (S-1): normal       Sensory-Monofilament:          Left foot: normal          Right foot: normal       Inspection:          Left foot: normal          Right foot: normal       Nails:          Left foot: normal          Right foot: normal    Foot Exam by Podiatrist:       Date: 01/10/2010       Results: no diabetic findings       Done by: Denton Meek   Impression & Recommendations:  Problem # 1:  CELLULITIS, LEG, LEFT (ICD-682.6) Assessment New Will treat with doxycylcine for 7 days. and f/u in 1 week. will Xray to eval for frx and /or osteomyelitis due to a recent trauma to left leg. Her updated medication list for this problem includes:    Doxycycline Hyclate 100 Mg Tabs (Doxycycline hyclate) .Marland Kitchen... Take one tablet by mouth two times a day for 7 days with meals  Orders: Diagnostic X-Ray/Fluoroscopy (Diagnostic X-Ray/Flu)  Elevate affected area. Warm moist compresses for 20 minutes every 2 hours while awake. Take antibiotics as directed and take acetaminophen as needed. To be seen in 48-72 hours if no improvement, sooner if worse.  Problem # 2:  ANXIETY, CHRONIC (ICD-300.00) Assessment: Improved  controlled with Klonopin. Will D/C Xanax given good control with clonazepam. The following medications were removed from the medication list:    Xanax 0.25 Mg Tabs (Alprazolam) .Marland Kitchen... Take 1 tablet by mouth when needed. not to exceed one tablet per day. Her updated medication list for this problem includes:    Klonopin 0.5 Mg Tabs (Clonazepam) .Marland Kitchen... Take one tablet twice daily  Discussed medication use and relaxation techniques.   Problem # 3:  HYPERTENSION (ICD-401.9) Assessment: Deteriorated  Patient c/o dizzy spells since stopped HCTZ several days ago. Negative for orthostasis. will reatrt HCTZ  --adheranace with regimen strongly emphasized.  check B-met next visit. Her updated medication list for this problem includes:    Hydrochlorothiazide 25 Mg Tabs (Hydrochlorothiazide) .Marland Kitchen... Take 1 tablet by mouth once a day    Lisinopril 5 Mg Tabs (Lisinopril) .Marland Kitchen... Take 1 tablet by mouth once a day  BP today: 146/90 Prior BP: 126/78 (09/13/2009)  Labs Reviewed: K+: 3.9 (07/04/2009) Creat: : 0.57 (07/04/2009)   Chol: 212 (06/20/2008)   HDL: 39 (06/20/2008)   LDL: 118 (06/20/2008)   TG: 274 (06/20/2008)  Problem # 4:  DIABETES MELLITUS, TYPE II, MILD (ICD-250.00) Assessment: Comment Only  I question Dx of DM in this patient. Will discuss with Ms. Victory Dakin and Attneding Dr. Phillips Odor. Her updated medication list for this problem includes:    Lisinopril 5 Mg Tabs (Lisinopril) .Marland Kitchen... Take 1 tablet by mouth once a day  Orders: T-Hgb A1C (in-house) (65784ON) T- Capillary Blood Glucose (62952)  Labs Reviewed: Creat: 0.57 (07/04/2009)     Last Eye Exam: No diabetic retinopathy.    (12/14/2008) Reviewed HgBA1c results: 5.0 (09/13/2009)  5.3 (06/07/2009)  Complete Medication List: 1)  Hydrochlorothiazide 25 Mg Tabs (Hydrochlorothiazide) .... Take 1 tablet by mouth once a day 2)  Coumadin 2 Mg Tabs (Warfarin sodium) .... Take as directed. 3)  Tramadol Hcl 50  Mg Tabs (Tramadol hcl) .Marland Kitchen.. 1 by mouth q 6hrs prn 4)  Lisinopril 5 Mg Tabs (Lisinopril) .... Take 1 tablet by mouth once a day 5)  Vicodin 5-500 Mg Tabs (Hydrocodone-acetaminophen) .... Please take one pill as needed for pain, not more than four times a day. 6)  Klonopin 0.5 Mg Tabs (Clonazepam) .... Take one tablet twice daily 7)  Doxycycline Hyclate 100 Mg Tabs (Doxycycline hyclate) .... Take one tablet by mouth two times a day for 7 days with meals  Other Orders: T-Lipid Profile (04540-98119) T-Hepatic Function [phone]) Hepatitis B Vaccine >31yrs (30865) Admin 1st Vaccine (78469)  Patient Instructions: 1)  Please, pick up  your prescriptions at the pharmacy and follow up with an Xray of your left leg. 2)  Please, return to clinic in 1 week if no resolution of symptoms and call with any concerns. 3)  Please, make an appointment for a wel woman exam. Prescriptions: COUMADIN 2 MG TABS (WARFARIN SODIUM) Take as directed.  #30 x 3   Entered and Authorized by:   Deatra Robinson MD   Signed by:   Deatra Robinson MD on 01/10/2010   Method used:   Electronically to        [facility] [phone]* (retail)       [address]       [place], [place]  [address]       Ph: [phone]       Fax: [phone]   RxID:   [id number] DOXYCYCLINE HYCLATE 100 MG TABS (DOXYCYCLINE HYCLATE) Take one tablet by mouth two times a day for 7 days with meals  #14 x 0   Entered and Authorized by:   Deatra Robinson MD   Signed by:   Deatra Robinson MD on 01/10/2010   Method used:   Electronically to        [facility] [address] [phone]* (retail)       [address]       [place], [place]  [address]       Ph: [phone]       Fax: [phone]   RxID:   [id number] KLONOPIN 0.5 MG TABS (CLONAZEPAM) Take one tablet twice daily  #60 x 3   Entered and Authorized by:   Deatra Robinson MD   Signed by:   Deatra Robinson MD on 01/10/2010   Method used:   Print then Give to Patient   RxID:   [id number]    Orders Added: 1)  T-Hgb A1C (in-house) [46270JJ] 2)  T- Capillary Blood Glucose [82948] 3)  T-Lipid Profile [80061-22930] 4)  T-Hepatic Function [00938-18299] 5)  Est. Patient Level III [37169] 6)  Diagnostic X-Ray/Fluoroscopy [Diagnostic X-Ray/Flu] 7)  Hepatitis B Vaccine >48yrs [90746] 8)  Admin 1st Vaccine [67893]   Immunizations Administered:  Tetanus Vaccine:    Site: right deltoid    Mfr: GlaxoSmithKline    Dose: 0.5 ml    Route: IM    Given by: Cynda Familia (AAMA)    Exp. Date: 11/03/2011    Lot #: [id number]    VIS given: 12/02/07 version given January 10, 2010.  Hepatitis B Vaccine # 3:     Vaccine Type: HepB Adult    Site: right deltoid    Mfr: Merck    Dose: 1.0 ml    Route: IM    Given by: Cynda Familia (AAMA)    Exp. Date: 05/31/2011    Lot #: [id number]    VIS given: 07/31/05 version given January 10, 2010.  Immunizations Administered:  Tetanus Vaccine:    Site: right deltoid    Mfr: GlaxoSmithKline    Dose: 0.5 ml    Route: IM    Given by: Cynda Familia (AAMA)    Exp. Date: 11/03/2011    Lot #: [id number]    VIS given: 12/02/07 version given January 10, 2010.  Hepatitis B Vaccine # 3:    Vaccine Type: HepB Adult    Site: right deltoid    Mfr: Merck    Dose: 1.0 ml    Route: IM    Given by: Cynda Familia (AAMA)    Exp. Date: 05/31/2011    Lot #: [id number]    VIS given: 07/31/05 version given January 10, 2010. Process Orders Check Orders Results:     [facility]: Order checked:     Deatra Robinson MD NOT AUTHORIZED TO ORDER Tests Sent for requisitioning (January 11, 2010 9:00 AM):     01/10/2010: [facility] -- T-Lipid Profile [phone] (signed)     01/10/2010: [facility] -- T-Hepatic Function [phone] (signed)     Prevention & Chronic Care Immunizations   Influenza vaccine: Not documented   Influenza vaccine deferral: Refused  (11/28/2008)    Tetanus booster: Not documented   Td booster deferral: Refused  (03/03/2009)    Pneumococcal vaccine: Not documented   Pneumococcal vaccine deferral: Refused  (03/03/2009)  Other Screening   Pap smear: Not documented   Pap smear action/deferral: Deferred  (03/03/2009)    Mammogram: ASSESSMENT: Negative - BI-RADS 1^MM DIGITAL SCREENING  (03/27/2009)   Mammogram action/deferral: Ordered  (03/03/2009)   Smoking status: quit  (01/10/2010)  Lipids   Total Cholesterol: 212  (06/20/2008)   Lipid panel action/deferral: Lipid Panel ordered   LDL: 118  (06/20/2008)   LDL Direct: Not documented   HDL: 39  (06/20/2008)   Triglycerides:  274  (06/20/2008)   Lipid panel due: 06/20/2009    SGOT (AST): 57  (09/06/2008)   BMP action: Ordered   SGPT (ALT): 64  (09/06/2008)   Alkaline phosphatase: 77  (09/06/2008)   Total bilirubin: 0.3  (09/06/2008)   Liver panel due: 09/06/2009    Lipid flowsheet reviewed?: Yes   Progress toward LDL goal: Unchanged    Stage of readiness to change (lipid management): Maintenance  Hypertension   Last Blood Pressure: 146 / 90  (01/10/2010)   Serum creatinine: 0.57  (07/04/2009)   Serum potassium 3.9  (07/04/2009)   Basic metabolic panel due: 07/05/2010    Hypertension flowsheet reviewed?: Yes   Progress toward BP goal: Unchanged    Stage of readiness to change (hypertension management): Maintenance  Self-Management Support :   Personal Goals (by the next clinic visit) :      Personal blood pressure goal: 130/80  (07/04/2009)     Personal LDL goal: 130  (09/13/2009)    Patient will work on the following items until the next clinic visit to reach self-care goals:     Medications and monitoring: take my medicines every day  (01/10/2010)     Eating: eat foods that are low in salt, eat baked foods instead of fried foods  (01/10/2010)     Activity: take a 30 minute walk every day, take the stairs instead of the elevator, park at the far end of the parking lot, join a walking program  (09/13/2009)    Hypertension self-management support: Written self-care plan  (01/10/2010)   Hypertension self-care plan printed.    Lipid self-management support: Written  self-care plan  (01/10/2010)   Lipid self-care plan printed.

## 2010-02-15 NOTE — Assessment & Plan Note (Signed)
Summary: COU/CH  Anticoagulant Therapy Managed by: Barbera Setters. Sarah Simon  PharmD CACP Referring MD: Josem Kaufmann MD, Lyman Bishop PCP: Clerance Lav MD [facility] Attending: Rogelia Boga MD, Lanora Manis Indication 1: Pulmonary  embolus Indication 2: Encounter for therapeutic drug monitoring  V58.83 Start date: 05/20/2001 Duration: Indefinite  Patient Assessment Reviewed by: Chancy Milroy PharmD  January 22, 2010 Medication review: verified warfarin dosage & schedule,verified previous prescription medications, verified doses & any changes, verified new medications, reviewed OTC medications, reviewed OTC health products-vitamins supplements etc Complications: none Dietary changes: none   Health status changes: none   Lifestyle changes: none   Recent/future hospitalizations: none   Recent/future procedures: none   Recent/future dental: none Patient Assessment Part 2:  Have you MISSED ANY DOSES or CHANGED TABLETS?  No missed Warfarin doses or changed tablets.  Have you had any BRUISING or BLEEDING ( nose or gum bleeds,blood in urine or stool)?  No reported bruising or bleeding in nose, gums, urine, stool.  Have you STARTED or STOPPED any MEDICATIONS, including OTC meds,herbals or supplements?  No other medications or herbal supplements were started or stopped.  Have you CHANGED your DIET, especially green vegetables,or ALCOHOL intake?  No changes in diet or alcohol intake.  Have you had any ILLNESSES or HOSPITALIZATIONS?  No reported illnesses or hospitalizations  Have you had any signs of CLOTTING?(chest discomfort,dizziness,shortness of breath,arms tingling,slurred speech,swelling or redness in leg)    No chest discomfort, dizziness, shortness of breath, tingling in arm, slurred speech, swelling, or redness in leg.     Treatment  Target INR: 2.0-3.0 INR: 1.7  Date: 01/22/2010 Regimen In:  14.0mg /week INR reflects regimen in: 1.7  New  Tablet strength: : 2mg  Regimen Out:     Sunday: 1 Tablet  Monday: 1 & 1/2 Tablet     Tuesday: 1 Tablet     Wednesday: 1 Tablet     Thursday: 1 Tablet      Friday: 1 Tablet     Saturday: 1 Tablet Total Weekly: 15.0mg /week mg  Next INR Due: 02/12/2010 Adjusted by: Barbera Setters. Alexandria Lodge III PharmD CACP   Return to anticoagulation clinic:  02/12/2010 Time of next visit: 1000    Allergies: No Known Drug Allergies

## 2010-02-15 NOTE — Progress Notes (Signed)
Summary: refill/ hla  Phone Note Refill Request Message from:  Fax from Pharmacy on January 19, 2010 11:58 AM  Refills Requested: Medication #1:  TRAMADOL HCL 50 MG  TABS 1 by mouth q 6hrs prn   Dosage confirmed as above?Dosage Confirmed   Last Refilled: 11/29 Initial call taken by: Marin Roberts RN,  January 19, 2010 11:59 AM    Prescriptions: TRAMADOL HCL 50 MG  TABS (TRAMADOL HCL) 1 by mouth q 6hrs prn  #120 x 3   Entered and Authorized by:   Clerance Lav MD   Signed by:   Clerance Lav MD on 01/19/2010   Method used:   Electronically to        [facility] [phone]* (retail)       [address]       [place], [place]  [address]       Ph: [phone]       Fax: [phone]   RxID:   [id number]

## 2010-02-15 NOTE — Progress Notes (Signed)
Summary: med refill/gp  Phone Note Refill Request Message from:  Fax from Pharmacy on January 10, 2010 3:56 PM  Refills Requested: Medication #1:  LISINOPRIL 5 MG TABS Take 1 tablet by mouth once a day Last appt. today.   Method Requested: Electronic Initial call taken by: Chinita Pester RN,  January 10, 2010 3:56 PM  Follow-up for Phone Call       Follow-up by: Clerance Lav MD,  January 11, 2010 9:09 AM    Prescriptions: LISINOPRIL 5 MG TABS (LISINOPRIL) Take 1 tablet by mouth once a day  #31 x 5   Entered and Authorized by:   Clerance Lav MD   Signed by:   Clerance Lav MD on 01/11/2010   Method used:   Electronically to        [facility] [phone]* (retail)       [address]       [place], [place]  [address]       Ph: [phone]       Fax: [phone]   RxID:   [id number]

## 2010-02-21 NOTE — Assessment & Plan Note (Signed)
Summary: COU/CH  Anticoagulant Therapy Managed by: Barbera Setters. Janie Morning  PharmD CACP Referring MD: Josem Kaufmann MD, Lyman Bishop PCP: Clerance Lav MD [facility] Attending: Reche Dixon MD, Onalee Hua Indication 1: Pulmonary  embolus Indication 2: Encounter for therapeutic drug monitoring  V58.83 Start date: 05/20/2001 Duration: Indefinite  Patient Assessment Reviewed by: Chancy Milroy PharmD  February 12, 2010 Medication review: verified warfarin dosage & schedule,verified previous prescription medications, verified doses & any changes, verified new medications, reviewed OTC medications, reviewed OTC health products-vitamins supplements etc Complications: none Dietary changes: none   Health status changes: none   Lifestyle changes: none   Recent/future hospitalizations: none   Recent/future procedures: none   Recent/future dental: none Patient Assessment Part 2:  Have you MISSED ANY DOSES or CHANGED TABLETS?  No missed Warfarin doses or changed tablets.  Have you had any BRUISING or BLEEDING ( nose or gum bleeds,blood in urine or stool)?  No reported bruising or bleeding in nose, gums, urine, stool.  Have you STARTED or STOPPED any MEDICATIONS, including OTC meds,herbals or supplements?  No other medications or herbal supplements were started or stopped.  Have you CHANGED your DIET, especially green vegetables,or ALCOHOL intake?  No changes in diet or alcohol intake.  Have you had any ILLNESSES or HOSPITALIZATIONS?  No reported illnesses or hospitalizations  Have you had any signs of CLOTTING?(chest discomfort,dizziness,shortness of breath,arms tingling,slurred speech,swelling or redness in leg)    No chest discomfort, dizziness, shortness of breath, tingling in arm, slurred speech, swelling, or redness in leg.     Treatment  Target INR: 2.0-3.0 INR: 2.7  Date: 02/12/2010 Regimen In:  15.0mg /week INR reflects regimen in: 2.7  New  Tablet strength: : 2mg  Next INR Due: 03/12/2010 Adjusted by: Barbera Setters. Alexandria Lodge III PharmD CACP   Return to anticoagulation clinic:  03/12/2010 Time of next visit: 1045    Allergies: No Known Drug Allergies

## 2010-03-09 ENCOUNTER — Encounter: Payer: Self-pay | Admitting: Internal Medicine

## 2010-03-12 ENCOUNTER — Ambulatory Visit (INDEPENDENT_AMBULATORY_CARE_PROVIDER_SITE_OTHER): Payer: Self-pay | Admitting: Pharmacist

## 2010-03-12 DIAGNOSIS — Z7901 Long term (current) use of anticoagulants: Secondary | ICD-10-CM

## 2010-03-12 DIAGNOSIS — I82409 Acute embolism and thrombosis of unspecified deep veins of unspecified lower extremity: Secondary | ICD-10-CM

## 2010-03-12 DIAGNOSIS — I2699 Other pulmonary embolism without acute cor pulmonale: Secondary | ICD-10-CM

## 2010-03-12 LAB — POCT INR: INR: 2.8

## 2010-03-12 NOTE — Patient Instructions (Signed)
Patient instructed to take medications as defined in the Anti-coagulation Track section of this encounter.  Patient instructed to take today's dose.  Patient verbalized understanding of these instructions.    

## 2010-03-12 NOTE — Progress Notes (Signed)
Anti-Coagulation Progress Note  Bette A Bolanos is a 50 y.o. female who is currently on an anti-coagulation regimen.    RECENT RESULTS: Recent results are below, the most recent result is correlated with a dose of 15 mg. per week: Lab Results  Component Value Date   INR 2.8 03/12/2010   INR 2.7 02/12/2010   INR 1.7 01/22/2010    ANTI-COAG DOSE:   Latest dosing instructions   Total Sun Mon Tue Wed Thu Fri Sat   15 2 mg 3 mg 2 mg 2 mg 2 mg 2 mg 2 mg    (2 mg1) (2 mg1.5) (2 mg1) (2 mg1) (2 mg1) (2 mg1) (2 mg1)         ANTICOAG SUMMARY: Anticoagulation Episode Summary              Current INR goal 2.0-3.0 Next INR check 04/09/2010   INR from last check 2.8 (03/12/2010)     Weekly max dose (mg)  Target end date Indefinite   Indications PULMONARY EMBOLISM, DVT, Long term current use of anticoagulant   INR check location [facility]erred lab    Send INR reminders to [facility]   Comments        Provider Role Specialty Phone number   Blanch Media  Internal Medicine [phone]        ANTICOAG TODAY: Anticoagulation Summary as of 03/12/2010              INR goal 2.0-3.0     Selected INR 2.8 (03/12/2010) Next INR check 04/09/2010   Weekly max dose (mg)  Target end date Indefinite   Indications PULMONARY EMBOLISM, DVT, Long term current use of anticoagulant    Anticoagulation Episode Summary              INR check location [facility] Preferred lab    Send INR reminders to [name]   Comments        Provider Role Specialty Phone number   Blanch Media  Internal Medicine [phone]        PATIENT INSTRUCTIONS: Patient Instructions  Patient instructed to take medications as defined in the Anti-coagulation Track section of this encounter.  Patient instructed to take today's dose.  Patient verbalized understanding of these instructions.        FOLLOW-UP Return in 4 weeks (on 04/09/2010) for Follow up INR.  Hulen Luster,  III Pharm.D., CACP

## 2010-03-29 LAB — GLUCOSE, CAPILLARY: Glucose-Capillary: 170 mg/dL — ABNORMAL HIGH (ref 70–99)

## 2010-04-02 ENCOUNTER — Ambulatory Visit (INDEPENDENT_AMBULATORY_CARE_PROVIDER_SITE_OTHER): Payer: Self-pay | Admitting: Pharmacist

## 2010-04-02 DIAGNOSIS — I2699 Other pulmonary embolism without acute cor pulmonale: Secondary | ICD-10-CM

## 2010-04-02 DIAGNOSIS — I82409 Acute embolism and thrombosis of unspecified deep veins of unspecified lower extremity: Secondary | ICD-10-CM

## 2010-04-02 DIAGNOSIS — Z7901 Long term (current) use of anticoagulants: Secondary | ICD-10-CM

## 2010-04-02 LAB — POCT INR: INR: 3.2

## 2010-04-02 LAB — GLUCOSE, CAPILLARY: Glucose-Capillary: 111 mg/dL — ABNORMAL HIGH (ref 70–99)

## 2010-04-02 NOTE — Patient Instructions (Signed)
Patient instructed to take medications as defined in the Anti-coagulation Track section of this encounter.  Patient instructed to take today's dose.  Patient verbalized understanding of these instructions.    

## 2010-04-02 NOTE — Progress Notes (Signed)
Anti-Coagulation Progress Note  Sarah Simon is a 50 y.o. female who is currently on an anti-coagulation regimen.    RECENT RESULTS: Recent results are below, the most recent result is correlated with a dose of 15 mg. per week: Lab Results  Component Value Date   INR 3.2 04/02/2010   INR 2.8 03/12/2010   INR 2.7 02/12/2010    ANTI-COAG DOSE:   Latest dosing instructions   Total Sun Mon Tue Wed Thu Fri Sat   14 2 mg 2 mg 2 mg 2 mg 2 mg 2 mg 2 mg    (2 mg1) (2 mg1) (2 mg1) (2 mg1) (2 mg1) (2 mg1) (2 mg1)         ANTICOAG SUMMARY: Anticoagulation Episode Summary              Current INR goal 2.0-3.0 Next INR check 04/30/2010   INR from last check 3.2! (04/02/2010)     Weekly max dose (mg)  Target end date Indefinite   Indications PULMONARY EMBOLISM, DVT, Long term current use of anticoagulant   [facility] check [facility]    Send INR reminders to [facility]   Comments        Provider Role Specialty Phone number   Blanch Media  Internal Medicine [phone]        ANTICOAG TODAY: Anticoagulation Summary as of 04/02/2010              INR goal 2.0-3.0     Selected INR 3.2! (04/02/2010) Next INR check 04/30/2010   Weekly max dose (mg)  Target end date Indefinite   Indications PULMONARY EMBOLISM, DVT, Long term current use of anticoagulant    Anticoagulation Episode Summary              INR check location [facility] Preferred lab    Send INR reminders to [facility]   Comments        Provider Role Specialty Phone number   Blanch Media  Internal Medicine [phone]        PATIENT INSTRUCTIONS: Patient Instructions  Patient instructed to take medications as defined in the Anti-coagulation Track section of this encounter.  Patient instructed to take today's dose.  Patient verbalized understanding of these instructions.        FOLLOW-UP Return in 4 weeks (on 04/30/2010) for Follow up INR.  Hulen Luster,  III Pharm.D., CACP

## 2010-04-09 ENCOUNTER — Ambulatory Visit: Payer: Self-pay

## 2010-04-21 LAB — GLUCOSE, CAPILLARY: Glucose-Capillary: 184 mg/dL — ABNORMAL HIGH (ref 70–99)

## 2010-04-23 ENCOUNTER — Ambulatory Visit (INDEPENDENT_AMBULATORY_CARE_PROVIDER_SITE_OTHER): Payer: Self-pay | Admitting: Pharmacist

## 2010-04-23 ENCOUNTER — Encounter: Payer: Self-pay | Admitting: Internal Medicine

## 2010-04-23 ENCOUNTER — Ambulatory Visit (INDEPENDENT_AMBULATORY_CARE_PROVIDER_SITE_OTHER): Payer: Self-pay | Admitting: Internal Medicine

## 2010-04-23 DIAGNOSIS — Z0000 Encounter for general adult medical examination without abnormal findings: Secondary | ICD-10-CM

## 2010-04-23 DIAGNOSIS — M25569 Pain in unspecified knee: Secondary | ICD-10-CM

## 2010-04-23 DIAGNOSIS — I82409 Acute embolism and thrombosis of unspecified deep veins of unspecified lower extremity: Secondary | ICD-10-CM

## 2010-04-23 DIAGNOSIS — Z7901 Long term (current) use of anticoagulants: Secondary | ICD-10-CM

## 2010-04-23 DIAGNOSIS — I2699 Other pulmonary embolism without acute cor pulmonale: Secondary | ICD-10-CM

## 2010-04-23 DIAGNOSIS — E785 Hyperlipidemia, unspecified: Secondary | ICD-10-CM

## 2010-04-23 DIAGNOSIS — R42 Dizziness and giddiness: Secondary | ICD-10-CM

## 2010-04-23 DIAGNOSIS — I10 Essential (primary) hypertension: Secondary | ICD-10-CM

## 2010-04-23 DIAGNOSIS — F411 Generalized anxiety disorder: Secondary | ICD-10-CM

## 2010-04-23 DIAGNOSIS — B182 Chronic viral hepatitis C: Secondary | ICD-10-CM

## 2010-04-23 LAB — POCT INR: INR: 1.5

## 2010-04-23 LAB — GLUCOSE, CAPILLARY: Glucose-Capillary: 127 mg/dL — ABNORMAL HIGH (ref 70–99)

## 2010-04-23 MED ORDER — TRAMADOL HCL 50 MG PO TABS: 100.0000 mg | ORAL_TABLET | Freq: Four times a day (QID) | ORAL | Status: DC | PRN

## 2010-04-23 MED ORDER — HYDROCHLOROTHIAZIDE 25 MG PO TABS: 25.0000 mg | ORAL_TABLET | Freq: Every day | ORAL | Status: DC

## 2010-04-23 MED ORDER — CLONAZEPAM 0.5 MG PO TABS: 0.5000 mg | ORAL_TABLET | Freq: Two times a day (BID) | ORAL | Status: DC

## 2010-04-23 MED ORDER — LISINOPRIL 5 MG PO TABS: 5.0000 mg | ORAL_TABLET | Freq: Every day | ORAL | Status: DC

## 2010-04-23 NOTE — Patient Instructions (Signed)
Follow up in one week. Monitor your BP at home. Bring the diary with you. We will do pap smear on next visit. You need to make an appointment with the psychiatrist. You also need to finish paperwork for your orange card.

## 2010-04-23 NOTE — Progress Notes (Signed)
Anti-Coagulation Progress Note  Sarah Simon is a 50 y.o. female who is currently on an anti-coagulation regimen.    RECENT RESULTS: Recent results are below, the most recent result is correlated with a dose of 14 mg. per week: Lab Results  Component Value Date   INR 1.5 04/23/2010   INR 3.2 04/02/2010   INR 2.8 03/12/2010    ANTI-COAG DOSE:   Latest dosing instructions   Total Sun Mon Tue Wed Thu Fri Sat   15 2 mg 3 mg 2 mg 2 mg 2 mg 2 mg 2 mg    (2 mg1) (2 mg1.5) (2 mg1) (2 mg1) (2 mg1) (2 mg1) (2 mg1)         ANTICOAG SUMMARY: Anticoagulation Episode Summary              Current INR goal 2.0-3.0 Next INR check 05/21/2010   INR from last check 1.5! (04/23/2010)     Weekly max dose (mg)  Target end date Indefinite   Indications PULMONARY EMBOLISM, DVT, Long term current use of anticoagulant   [facility] check [facility]    Send INR reminders to [facility]   Comments        Provider Role Specialty Phone number   Blanch Media  Internal Medicine [phone]        ANTICOAG TODAY: Anticoagulation Summary as of 04/23/2010              INR goal 2.0-3.0     Selected INR 1.5! (04/23/2010) Next INR check 05/21/2010   Weekly max dose (mg)  Target end date Indefinite   Indications PULMONARY EMBOLISM, DVT, Long term current use of anticoagulant    Anticoagulation Episode Summary              INR check location [facility]    Send INR reminders to [name]   Comments        Provider Role Specialty Phone number   Blanch Media  Internal Medicine [phone]        PATIENT INSTRUCTIONS: Patient Instructions  Patient instructed to take medications as defined in the Anti-coagulation Track section of this encounter.  Patient instructed to taketoday's dose.  Patient verbalized understanding of these instructions.        FOLLOW-UP Return in about 4 weeks (around 05/21/2010) for Follow up INR.  Hulen Luster,  III Pharm.D., CACP

## 2010-04-23 NOTE — Assessment & Plan Note (Signed)
She has deferred PAP smear for next week. She also needs to get a tetanus shot, which she has deffered.

## 2010-04-23 NOTE — Assessment & Plan Note (Signed)
On chronic anticoagulation, monitored by Dr. Alexandria Lodge.

## 2010-04-23 NOTE — Patient Instructions (Signed)
Patient instructed to take medications as defined in the Anti-coagulation Track section of this encounter.  Patient instructed to take today's dose.  Patient verbalized understanding of these instructions.    

## 2010-04-23 NOTE — Assessment & Plan Note (Signed)
Persistent, high blood pressures on past few visits. Last time she reported that she was not taking HCTZ as she thought, it was related to her dizziness. She now reports she is taking and continues to have dizziness. Her BP goes up to diastolic 110, on todays visit while standing up. But according to patient, this is due to her anxiety at the doctor's office. When she checks it her local pharmacy it is very normal. I have asked her to make a diary and return in one week. My suspicion is that she is non compliant with her meds, and I am reluctant to keep adding meds, which may eventually drop her blood pressure when taken as prescribed.

## 2010-04-23 NOTE — Assessment & Plan Note (Signed)
ldl below goal level. No angina equivalent, her diabetes is not definite diagnosis. A1C is <6, now off metformin. Continue to monitor.

## 2010-04-23 NOTE — Assessment & Plan Note (Signed)
She has had this as a chronic complain, since 2008. This may or may not be related to her BP, tramadol or other medications. Continue to monitor for now.

## 2010-04-23 NOTE — Progress Notes (Signed)
Subjective:    Patient ID: Sarah Simon, female    DOB: [birth year], 50 y.o.   MRN: [id number]  HPI 50 year old female with past history significant for chronic hepatitis C carrier with normal LFTs, DVT and PE, hypertension who now presents with complaint of ongoing knee pain and dizziness. Patient was last seen in December and a homemaker complain of dizziness and hypertension. At that time she was not taking her hydrochlorothiazide. Patient was advised to start taking it. She reports that she is now taking it and work pressures normal in Production designer, theatre/television/film at pharmacy.  She reports that she had been to the hepatitis clinic in the past and was asked to follow for one year with the psychiatrist. She has not made that appointment yet. She is taking her Coumadin as prescribed and her INRs are well controlled. She reports that she had seen orthopedic surgeon in the past for her knee problem and was told that she had tear in her ligaments and she needed surgery. Since she does not have insurance and no parents available for surgery she did not undergo the surgery. She is telling me that tramadol is not controlling her pain. She wants me to increase her dose of tramadol. She reports is at times she takes 2 of tramadol and it helped relieve the pain. She avoids Tylenol she has hepatitis C.  She is dizzy especially when she is changing position. It is not related to her passing out or syncope. Her dizziness is mild and controllable. She does not recall any fall trauma. She has normal vision.  Review of Systems  All other systems reviewed and are negative.       Objective:   Physical Exam  Nursing note and vitals reviewed. Musculoskeletal:       Right knee: She exhibits swelling, effusion and deformity.  BP 159/99  Pulse 73  Temp(Src) 96.5 F (35.8 C) (Oral)  Ht 5' 7.5" (1.715 m)  Wt 151 lb 8 oz (68.72 kg)  BMI 23.38 kg/m2  LMP 04/11/2010  General Appearance:    Alert, cooperative, no distress,  appears stated age  Head:    Normocephalic, without obvious abnormality, atraumatic  Eyes:    PERRL, conjunctiva/corneas clear, EOM's intact, fundi    benign, both eyes  Ears:    Normal TM's and external ear canals, both ears  Nose:   Nares normal, septum midline, mucosa normal, no drainage    or sinus tenderness  Throat:   Lips, mucosa, and tongue normal; teeth and gums normal  Neck:   Supple, symmetrical, trachea midline, no adenopathy;    thyroid:  no enlargement/tenderness/nodules; no carotid   bruit or JVD  Back:     Symmetric, no curvature, ROM normal, no CVA tenderness  Lungs:     Clear to auscultation bilaterally, respirations unlabored  Chest Wall:    No tenderness or deformity   Heart:    Regular rate and rhythm, S1 and S2 normal, no murmur, rub   or gallop     Abdomen:     Soft, non-tender, bowel sounds active all four quadrants,    no masses, no organomegaly  Extremities:   Extremities normal, atraumatic, no cyanosis or edema  Pulses:   2+ and symmetric all extremities  Skin:   Skin color, texture, turgor normal, no rashes or lesions  Lymph nodes:   Cervical, supraclavicular, and axillary nodes normal  Neurologic:   CNII-XII intact, normal strength, sensation and reflexes    throughout  Assessment & Plan:

## 2010-04-23 NOTE — Assessment & Plan Note (Addendum)
Pt reprots that she can not afford to get surgery for her meniscal tear. She says she doubles up on her tramadol time to time, and takes only twice a day instead of four times a day. The pain releif is not adequete with the prescirbed dose. Given her past history of depression, suicide attempts and other anxiety related disorders, I am reluctant to up the narcotic genre for pain control. I will increase tramadol dose and see if that helps. Also she has hep c so we will have to control her tylenol dose.   UDS is positive for cocaine. I have called patient but no answer. She has history of PSA. We will establish pain contract and if she violates in the future, she should be precluded from getting tramadol or other narcotics from the clinic

## 2010-04-23 NOTE — Assessment & Plan Note (Signed)
Very unclear if she is taking her Klonapin. As she is purchasing her meds on cash, the automatic reconciliation in the epic does not work. I have emphasized that if she is truly having generalised anxiety, she is likely to benefit with klonapin, rather than xanax.

## 2010-04-24 LAB — DRUG SCREEN, URINE
Amphetamine Screen, Ur: NEGATIVE
Benzodiazepines.: POSITIVE — AB
Creatinine,U: 136.6 mg/dL
Methadone: NEGATIVE
Propoxyphene: NEGATIVE

## 2010-04-25 LAB — BASIC METABOLIC PANEL
GFR calc Af Amer: >60 mL/min (ref >60)
GFR calc non Af Amer: >60 mL/min (ref >60)
Glucose, Bld: 113 mg/dL — ABNORMAL HIGH (ref 70–99)
Potassium: 3.5 mEq/L (ref 3.5–5.1)
Sodium: 138 mEq/L (ref 135–145)

## 2010-04-25 LAB — DIFFERENTIAL
Eosinophils Relative: 1 % (ref 0–5)
Lymphocytes Relative: 31 % (ref 12–46)
Lymphs Abs: 5 10*3/uL — ABNORMAL HIGH (ref 0.7–4.0)
Monocytes Absolute: 1.5 10*3/uL — ABNORMAL HIGH (ref 0.1–1.0)
Monocytes Relative: 9 % (ref 3–12)
Neutro Abs: 9.1 10*3/uL — ABNORMAL HIGH (ref 1.7–7.7)

## 2010-04-25 LAB — CBC
HCT: 43.7 % (ref 36.0–46.0)
Hemoglobin: 15.1 g/dL — ABNORMAL HIGH (ref 12.0–15.0)
RBC: 4.4 MIL/uL (ref 3.87–5.11)
WBC: 16.1 10*3/uL — ABNORMAL HIGH (ref 4.0–10.5)

## 2010-04-30 ENCOUNTER — Ambulatory Visit: Payer: Self-pay

## 2010-05-01 ENCOUNTER — Ambulatory Visit: Payer: Self-pay | Admitting: Internal Medicine

## 2010-05-28 ENCOUNTER — Ambulatory Visit (INDEPENDENT_AMBULATORY_CARE_PROVIDER_SITE_OTHER): Payer: Self-pay | Admitting: Pharmacist

## 2010-05-28 DIAGNOSIS — I82409 Acute embolism and thrombosis of unspecified deep veins of unspecified lower extremity: Secondary | ICD-10-CM

## 2010-05-28 DIAGNOSIS — Z7901 Long term (current) use of anticoagulants: Secondary | ICD-10-CM

## 2010-05-28 DIAGNOSIS — I2699 Other pulmonary embolism without acute cor pulmonale: Secondary | ICD-10-CM

## 2010-05-28 NOTE — Progress Notes (Signed)
Anti-Coagulation Progress Note  Sarah Simon is a 50 y.o. female who is currently on an anti-coagulation regimen.    RECENT RESULTS: Recent results are below, the most recent result is correlated with a dose of 15 mg. per week: Lab Results  Component Value Date   INR 4.0 05/28/2010   INR 1.5 04/23/2010   INR 3.2 04/02/2010    ANTI-COAG DOSE:   Latest dosing instructions   Total Sun Mon Tue Wed Thu Fri Sat   13 2 mg 2 mg 2 mg 1 mg 2 mg 2 mg 2 mg    (2 mg1) (2 mg1) (2 mg1) (2 mg0.5) (2 mg1) (2 mg1) (2 mg1)         ANTICOAG SUMMARY: Anticoagulation Episode Summary              Current INR goal 2.0-3.0 Next INR check 06/25/2010   INR from last check 4.0! (05/28/2010)     Weekly max dose (mg)  Target end date Indefinite   Indications PULMONARY EMBOLISM, DVT, Long term current use of anticoagulant   [facility] check [facility] Preferred [facility]    Send INR reminders to [facility]   Comments        Provider Role Specialty Phone number   Blanch Media  Internal Medicine [phone]        ANTICOAG TODAY: Anticoagulation Summary as of 05/28/2010              INR goal 2.0-3.0     Selected INR 4.0! (05/28/2010) Next INR check 06/25/2010   Weekly max dose (mg)  Target end date Indefinite   Indications PULMONARY EMBOLISM, DVT, Long term current use of anticoagulant    Anticoagulation Episode Summary              INR check location [facility] Preferred lab    Send INR reminders to [facility]   Comments        Provider Role Specialty Phone number   Blanch Media  Internal Medicine [phone]        PATIENT INSTRUCTIONS: Patient Instructions  Patient instructed to take medications as defined in the Anti-coagulation Track section of this encounter.  Patient instructed to OMIT/HOLD today's dose.  Patient verbalized understanding of these instructions.        FOLLOW-UP Return in 4 weeks (on 06/25/2010) for Follow up INR.  Hulen Luster,  III Pharm.D., CACP

## 2010-05-28 NOTE — Patient Instructions (Signed)
Patient instructed to take medications as defined in the Anti-coagulation Track section of this encounter.  Patient instructed to OMIT/HOLD today's dose.  Patient verbalized understanding of these instructions.    

## 2010-05-29 ENCOUNTER — Other Ambulatory Visit: Payer: Self-pay | Admitting: *Deleted

## 2010-05-29 DIAGNOSIS — I82409 Acute embolism and thrombosis of unspecified deep veins of unspecified lower extremity: Secondary | ICD-10-CM

## 2010-05-29 DIAGNOSIS — I2699 Other pulmonary embolism without acute cor pulmonale: Secondary | ICD-10-CM

## 2010-05-29 DIAGNOSIS — Z7901 Long term (current) use of anticoagulants: Secondary | ICD-10-CM

## 2010-05-29 MED ORDER — WARFARIN SODIUM 2 MG PO TABS: 2.0000 mg | ORAL_TABLET | ORAL | Status: DC

## 2010-05-29 NOTE — Telephone Encounter (Signed)
Looks like this was ordered yesterday but no amount given.  Can you resend?

## 2010-06-01 NOTE — Op Note (Signed)
[place]. [facility]  Patient:    Sarah Simon, Sarah Simon                  MRN: [id number] Proc. Date: 05/05/99 Adm. Date:  [id number] Attending:  Trauma, Md CC:         Sarah Simon. Hart Rochester, M.D.             Velora Heckler, M.D.             Onalee Hua III, M.D. (Office)                           Operative Report  PREOPERATIVE DIAGNOSIS:  Laceration to the left wrist with significant arterial bleeding, likely ulnar and radial artery laceration as well as nerve and tendon laceration.  Exam is difficult in the emergency room secondary to pain.  Suspect median nerve as well as wrist flexor laceration and possible finger flexor laceration.  POSTOPERATIVE DIAGNOSES: 1. Radial and ulnar artery laceration. 2. Median nerve laceration. 3. Flexor carpi ulnaris, flexor carpi radialis, palmaris longus, and flexor    digitorum superficialis to the middle finger, complete lacerations    (flexor tendon). 4. Partial lacerations to the small and ring finger flexor digitorum    superficialis which was not greater than 20%.  OPERATIVE PROCEDURES: 1. Repair ulnar artery and radial artery at the left wrist level.  This was    performed by Dr. Hart Rochester with Dr. Amanda Pea assisting. 2. Repair flexor carpi radialis, flexor carpi ulnaris, palmaris longus, and    flexor digitorum superficialis to the the middle finger, performed by    Dr. Amanda Pea. 3. Median nerve repair at the wrist level with a group fascicular and    epineural tear type using the microscope, performed by Dr. Amanda Pea. 4. Microscope use for the median nerve repair, performed by Dr. Amanda Pea. 5. Inspection of the ulnar nerve which was noted to be intact, performed by    Dr. Amanda Pea. 6. Inspection of the carpal canal contents with noted minor lacerations less    than 20% to the FDS to the ring and small finger.  FDP and FPL were noted    to be intact, performed by Dr. Amanda Pea.  ANESTHESIA:  General.  TOURNIQUET TIME:  Less  than 30 minutes.  ESTIMATED BLOOD LOSS:  Less than 100 cc.  COMPLICATIONS:  None.  DRAINS:  None.  SPECIMENS:  None.  INDICATION FOR PROCEDURE:  Sarah Simon is a 50 year old female who presents to the emergency room in the late hours after a self-inflicted wound to the left wrist.  The patient was seen by myself, trauma, surgery, and Dr. Hart Rochester in regards to her injuries.  It was felt that she would require I&D and repair of structures as necessary.  The patient was counselled in regards to risks and benefits of surgery, including risks of infection, bleeding, anesthesia, damage to normal structures, failure of surgery to accomplish the intended goals of alleviating symptoms.  The patients preoperative examination was difficult secondary to her pain level, etc.  The patient has obvious arterial bleeding with the preoperative findings as noted above.  She was fully informed of risks of surgery and was alert and oriented at the time of transport to the operative suite.  All questions were encouraged and answered.  This was a self-inflicted wound.  The patient states she was trying to kill herself.  She does not have a regular psychiatrist or psychologist.  She denies trying to commit suicide before.  OPERATIVE FINDINGS:  This patient had complete radial artery and ulnar artery lacerations.  These were repaired by Dr. Hart Rochester and Dr. Amanda Pea assistant. Dr. Hart Rochester will dictate the repairs under a separate dictation.  The patient also was noted to have an FCR, FCU, palmaris longus, and FDS to the middle finger laceration which were repaired with four-stranded Mersilene suture in a modified Kessler-Tajima fashion.  The patient had a median nerve laceration which was repaired with 9-0 and 8-0 nylon under the microscope in an epineural group fascicular manner.  The patient underwent exploration of the ulnar nerve which is noted to be intact.  The remainder of the carpal contents were  intact including the FPL and FDP.  There were minor lacerations to the FDS to the ring finger and small finger.  These were not repaired as they were certainly less than 20% of the tendon.  DESCRIPTION OF THE PROCEDURE IN DETAIL:  The patient was seen and counselled in holding.  She was then brought to the operative suite and underwent a smooth induction of general endotracheal anesthesia.  Following this, the arm was padded as was other body parts.  She was positioned and then underwent tourniquet placement.  The patient had significant arterial bleeding at the site of her transverse laceration about the left wrist and thus the tourniquet was inflated for the scrub and pain portion.  Once the patient underwent thorough prep and drape, the transverse laceration was then identified.  The patient had irrigation placed in the wound and structures were identified and noted.  The patient had noted radial artery and ulnar artery lacerations. These were repaired by Dr. Jerilee Field with Dr. Amanda Pea assisting.  The dictation for this will be dictated under a separate dictation by Dr. Hart Rochester.  Following radial artery and ulnar artery repairs, the patient did have exploration of the wrist.  Exploration of the wrist showed the ulnar nerve to be intact.  This was dissected under 4.0 degree magnification and noted to be without laceration.  The FCU was lacerated.  The carpal contents were identified sequentially.  This was done under 4.0 degree loupe magnification. The median nerve was noted to be lacerated.  The FPL was intact.  The FDS to the middle finger was completed lacerated.  The FDS to the ring and small finger had minor partial lacerations which required I&D only and no repair. The FDS to the index finger was intact.  The FDP to the index to the small fingers were intact as well.  The palmaris longus was completely severed.  The FCR was completely severed.  Once this was noted, the wound was  copiously I&Dd with saline.  The patient then underwent sequential repair of tendinous  structures as necessary.  This was done by using a four-stranded modified Kessler-Tajima type suture and reapproximating the tendons without bunching or excessive tension.  The patient had the FCR, FCU, palmaris longus, and FDS of the middle finger repaired in this fashion.  As mentioned the FDS to the ring and small fingers only required I&D as these were very superficial lacerations.  The patients median nerve was circumferentially isolated and then underwent repair.  This was done under the microscope at 250-300 magnification.  A group fascicular and epineural repair was undertaken using microinstruments and delicate dissection.  The patient had excellent approximation of the nerve without tension.  A very small fascicle on the floor of the median nerve was intact  which certainly aided in the alignment. The median artery and vessels on the volar midline also helped with identification of proper orientation.  This allowed for a group fascicular type repair and this was done with standard technique using Esmarch background and microinstruments.  The nerve approximated well.  I was very happy with its orientation and repair.  There was no tension with the wrist in neutral.  Once the median nerve was repaired, the tendons were again checked.  The palmaris longus, FCR, FCU, and FDS to the middle finger were noted to be intact and repaired nicely.  The patient then underwent copious irrigation of the wounds. Following this, the patient had the ulnar artery and radial artery checked for perfusion.  They were noted to be flowing nicely without clot or other abnormality.  The patient had a good superficial palmar arch as well as pulse to the index finger about the proper digital arteries.  There was intact reflex of the hand.  At this point, the patient then underwent further irrigation followed by closure of  the wound with Prolene suture.  The patient had a marking pen placed about the radial and ulnar arteries for postop monitoring.  The patient then had Adaptic gauze, Webril, Kerlix and a dorsal plaster slap applied.  This effectively immobilized the wrist in flexion to prevent tension on the artery and nerve repair.  The patient was monitored. The splint was allowed to cure and she was then awakened from anesthesia and extubated.  She was transferred to the recovery room in stable condition.  All sponge, needle, and instrument counts were reported as correct.  There are no immediate intraoperative complications.  The patient will be monitored in the hospital for perfusion to the hand, etc.  I have discussed with her all issues postoperatively. DD:  05/05/99 TD:  05/07/99 Job: [id number]

## 2010-06-01 NOTE — Discharge Summary (Signed)
[place]. [facility]  Patient:    Sarah Simon, Sarah Simon                  MRN: [id number] Adm. Date:  [id number] Disc. 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  OPERATIONS:  Left-sided pterional craniotomy with clipping of aneurysm.  HISTORY OF PRESENT ILLNESS:  Ms. Sardinas is a 50 year old female with history of psychiatric disease and previous suicide attempts who presents with acute headache and confusion.  Head CT scan demonstrates a diffuse subarachnoid hemorrhage.  Subsequent workup has demonstrated a large right-sided posterior communicating artery aneurysm.  The patient and her family were counseled as to the options.  They have decided to proceed with emergent craniotomy and clipping of aneurysm.  HOSPITAL COURSE:  The patient entered the operating room where a left-sided pterional craniotomy and clipping of her posterior communicating artery aneurysm were performed.  There was intraoperative rupture of the aneurysm with considerable bleeding.  This was controlled, and the aneurysm was clipped appropriately.  The patient awakened postoperatively in good condition.  She was awake, alert, oriented, and reasonably appropriate.  Motor and sensory and cranial nerve function were all intact.  The patient was treated in the intensive care unit and kept hyperdynamic and well hydrated.  The patient exhibited no signs or symptoms of vasospasm.  She was eventually mobilized from the unit.  Upon reaching the floor, the patient and her family desired discharge home.  She was still requiring steroid medications, and this was advised against.  The patient and her family decided to go AMA, and they were allowed to.  The patient will seek follow-up with me on an outpatient basis if she should so  choose.  DISCHARGE MEDICATIONS:  None.  CONDITION ON DISCHARGE:  Improved. DD:  12/05/99 TD:  12/07/99 Job: [id number]

## 2010-06-01 NOTE — Discharge Summary (Signed)
[place]. [facility]  Patient:    Sarah Simon, LARCOM Visit Number: [id number] MRN: [id number]          Service Type: MED Location: [phone] Attending Physician:  Edwyna Perfect Dictated by:   Marsh Dolly, M.D. Admit Date:  05/17/2001 Discharge Date: 05/22/2001                             Discharge Summary  DISCHARGE DIAGNOSES: 1. Pulmonary embolus.  CT scan of the chest showed multiple bilateral    segmental and subsegmental pulmonary emboli, right greater than left, left    lower lobe consolidations, small effusions, no deep venous thrombosis.  Lab    tests; homosysteine levels normal.  Protein C and protein S levels normal.    The patient was heterozygous for factor V leiden mutation.  Lupus    anticoagulant negative.  Homosysteine was 10.41.  ANA negative. 2. Leukocytosis. 3. Elevated hemoglobin which resolved. 4. History of subarachnoid hemorrhage status post pterional craniotomy on    October 16, 1999, secondary to ruptured internal carotid artery and    posterior communicating artery aneurysm. 5. Status post colectomy secondary to trauma. 6. Status post melanoma of the left thigh, status post excision in 2000. 7. Status post suicide attempt. 8. Hypercoagulable state, factor V leiden heterozygous which is associated    with protein S resistance and increased risk for venous thrombosis.  DISCHARGE MEDICATIONS: 1. Coumadin 5 mg p.o. q.d. 2. Celebrex 100 mg b.i.d. p.r.n. for pleuritic chest pain.  CONSULTING PHYSICIANS:  Pulmonary critical care medicine.  PROCEDURE:  None.  FOLLOW-UP:  The patient should follow up in the outpatient clinic within a week. Will call with follow-up appointment and also follow up with Dr. Lucas Mallow in the Coumadin Clinic for INR checks.  CHIEF COMPLAINT:  Chest pain.  HISTORY OF PRESENT ILLNESS:  The patient is a 50 year old white female with left-sided chest pain x3 to 4 days.  Pain is sharp and radiates  up to the left shoulder.  Worse with movement or lying flat.  The pain is 9 out of 10. The patient took aspirin and four Darvocet with minimal relief.  The patient woke up one morning after she went boating for a few hours.  The patient stated that the pain has been constant.  She is unemployed since her aneurysm, but is very active.  No tobacco, no oral contraceptives, no family history of clotting disorder, no long trips.  No history of dyslipidemia.  No diabetes and no hypertension.  It hurts to take a deep breath.  She has been short of breath for the past four days.  No recent surgery.  She is not obese, no pregnancy, and no trauma.  PAST MEDICAL HISTORY:  As per discharge problem list.  FAMILY HISTORY:  Father died in his 69s of heart disease.  Mother died in her 39s from a stroke.  Brother that has AIDS.  SOCIAL HISTORY:  Does not smoke.  Occasional alcohol.  She is unemployed since her aneurysm.  She used to be a Proofreader.  She had one year of college.  She is married with two children at 60 and 34 years old.  MEDICATIONS:  None.  ALLERGIES:  No known drug allergies.  REVIEW OF SYSTEMS:  Positive for shortness of breath x4 days with nonproductive cough and chest pain.  All other negative.  PHYSICAL EXAMINATION:  VITAL SIGNS: Temperature 98.6, blood pressure  139/80, pulse 86, respirations 20, pulse oximetry 96% on room air.  GENERAL: The patient lying in bed in no acute distress.  Appears her stated age.  HEENT: Head normocephalic and atraumatic.  Pupils are equal, round and reactive to light.  No erythema.  HEART: Regular rate and rhythm.  S1 and S2 constant intensity.  No murmurs, rubs, or gallops.  No DVT.  No carotid bruits.  LUNGS: Clear to auscultation bilaterally.  No wheezes, rhonchi, or rales.  Some decreased breath sounds in the left base.  ABDOMEN: Soft, nontender, and nondistended.  Positive bowel sounds with left upper quadrant scar.  RECTAL: Guaiac negative,  normal tone.  BREASTS: Negative for lumps, negative for dimpling or erythema.  EXTREMITIES: No edema.  Negative for Homans sign.  No palpable cords.  No lymphadenopathy.  Cranial nerves II-XII intact.  Strength 5/5 throughout.  DTRs 2+ bilaterally.  HOSPITAL COURSE:  #1 - PE.  The patient was admitted and given a bolus of heparin.  She was started on heparin drip and she was also started on Coumadin.  Her INR became therapeutic after two days of Coumadin.  She was, however, kept in the hospital for a few more days to receive five days of heparin therapy.  This standard of treatment is the PTT should be therapeutic at overlap with three days of the INR being therapeutic.  The patient was discharged.  Her shortness of breath resolved, but she had some pleuritic chest pain still.  During her hospitalization, critical care was consulted and the differential secondary to the consolidation in her lungs the differential for the consolidation was infectious versus infarct secondary to the PE, versus atelectasis.  The patient was given an incentive spirometer during her hospitalization and the consolidation in the left lower lobe improved.  The patient will need a follow-up chest x-ray in about six weeks.  #2 - Leukocytosis.  The patient had leukocytosis during her hospitalization, but that is probably secondary to the PE.  After a few weeks, will check another CBC.  #3 - History of subarachnoid hemorrhage, status post craniotomy.  I checked with the surgeon that did her craniotomy and stated that there was no contraindication to Coumadin therapy.  #4 - Hypercoagulable state.  All of the labs from her hypercoagulable workup was negative.  Her cardiac enzymes were negative except for the factor V leiden which was heterozygous for factor V leiden.  I gave the patient literature regarding this disorder and will send her home on Coumadin with contraversial whether she will need lifelong Coumadin  therapy, versus Coumadin therapy for a year to six months.  Per critical care team suggests lifelong Coumadin therapy.   DISCHARGE LABORATORY DATA:  A chest x-ray showed slight decrease in consolidation in the left base.  Chest CT on admission showed bilateral segmental and subsegmental pulmonary emboli, right greater than left, left lower lobe consolidation with adjacent small effusion.  Negative for lower extremity DVT.  She was heterozygous for factor V leiden mutation.  PT on discharge 23.7, INR 2.5.  Hemoccult negative.  WBC 12.4, hemoglobin 14.3, MCV 96.5, platelets 394, percent neutrophil 73.  Protein S 98, protein C 82, lupus anticoagulant negative.  Homosysteine level 10.41.  Sodium 139, potassium 3.8, chloride 104, CO2 28, glucose 119, BUN 6, creatinine 0.6, calcium 8.6, total protein 7.6, albumin 3.7, AST 40, ALT 47, alkaline phosphatase 78, total bilirubin 0.8.  UA negative.  Blood cultures negative x2.  Blood gas; pH 7.43, pCO2 39, pO2 65,  bicarb 26, with 93% saturation on admission.  EKG; normal sinus rhythm rate of 61, normal axis.  No ST segment elevation or depression. Dictated by:   Marsh Dolly, M.D. Attending Physician:  Edwyna Perfect DD:  05/22/01 TD:  05/25/01 Job: [id number]

## 2010-06-01 NOTE — Discharge Summary (Signed)
[place]. [facility]  Patient:    Sarah Simon, Sarah Simon                  MRN: [id number] Adm. Date:  [id number] Disc. Date: [id number] Attending:  Trauma, Md Dictator:   Vilinda Blanks. Moye, P.A.-C. CC:         [name], M.D.             Elisha Ponder, M.D.             Adelene Amas. Williford, M.D.                           Discharge Summary  DIAGNOSES: 1. Self-inflicted left wrist lacerations with radial and ulnar artery    lacerations. 2. Median nerve laceration. 3. Complete lacerations of flexor carpi ulnaris, flexor carpi radialis,    palmaris longus, and flexor digitorum superficialis with middle finger. 4. Partial lacerations to the small and ring finger flexor digitorum    superficialis not greater than 20%. 5. Adjustment disorder with depressed mood. 6. Alcohol dependence. 7. Cocaine dependence. 8. Cannabis abuse.  PROCEDURES: 1. Repair of ulnar artery and radial artery at the left wrist level, by    Dr. Josephina Gip on May 05, 1999. 2. Repair of flexor carpi radialis, flexor carpi ulnaris, palmaris longus,    and flexor digitorum superficialis to the middle finger, performed by    Dr. Onalee Hua on May 05, 1999. 3. Median nerve repair at the wrist level with ______  fascicular and    epineural tear type using the microscope, performed by Dr. Amanda Pea, on    May 05, 1999.  DISCHARGE MEDICATIONS: 1. OxyContin 20 mg 1 p.o. b.i.d. x 1 week. 2. Oxycodone 5 mg 1-2 p.o. q.4-6h. p.r.n. pain. 3. Motrin or Advil over-the-counter as needed for headache.  DISPOSITION:  The patient is discharged home.  DISCHARGE INSTRUCTIONS:  Follow up with [facility] as soon as possible as an outpatient.  She was instructed not to drive, lift with her left arm, or work until seen in clinic.  Wound care instructions were given.  FOLLOW-UP: 1. Dr. Amanda Pea in one week. 2. Dr. Hart Rochester of vascular surgery in three to four weeks. 3. Trauma clinic as needed.  HISTORY OF  PRESENT ILLNESS AND HOSPITAL COURSE:  This is a 50 year old white female with self-inflicted lacerations to the left wrist with an arterial bleed on arrival.  She had a large blood loss.  She had polysubstance intoxication on arrival.  She was seen in consultation by Dr. Amanda Pea from hand surgery and Dr. Hart Rochester from vascular surgery, with procedures performed as above.  Postoperative course was uneventful.  She had many complaints of pain.  Pain control was eventually achieved with continued-release oxycodone with immediate-release as needed.  She was seen in consultation by Dr. Jeanie Sewer from psychiatry for her suicide attempt.  He assessed her not to be at risk to harm herself or others and recommended inpatient alcohol and drug rehabilitation, which the patient declined.  She was discharged home on postoperative day #4, with medications and instructions for follow-up as above. DD:  07/06/99 TD:  07/07/99 Job: [id number]

## 2010-06-01 NOTE — Op Note (Signed)
[place]. [facility]  Patient:    LYNDIE, VANDERLOOP                  MRN: [id number] Proc. Date: 11/13/99 Adm. Date:  [id number] Attending:  Donn Pierini                           Operative Report  PREOPERATIVE DIAGNOSIS:  Grade 2 subarachnoid hemorrhage secondary to ruptured internal carotid artery/posterior communicating artery aneurysm.  POSTOPERATIVE DIAGNOSIS:  Grade 2 subarachnoid hemorrhage secondary to ruptured internal carotid artery/posterior communicating artery aneurysm.  PROCEDURE:  Left pterional craniotomy with clipping of left internal carotid artery/posterior communicating artery aneurysm.  Microdissection.  SURGEON:  Julio Sicks, M.D.  ASSISTANT:  Donzetta Sprung. Roney Jaffe., M.D.  ANESTHESIA:  General endotracheal.  INDICATIONS:  Ms. Tena is a 50 year old female who suffered the abrupt onset of headache last night.  The patient fell down and struck her head.  She suffered a laceration of her left cheek.  She reported to the emergency room today for evaluation of this cheek laceration.  A head CT scan was obtained, which demonstrated diffuse subarachnoid hemorrhage, worse on the left side around the left-sided sylvian cistern.  The patient was taken to arteriography for a cerebral arteriogram to rule out intracranial aneurysm.  A left-sided posterior communicating artery aneurysm was discovered, which was consistent with her hemorrhage.  The working hypothesis is that the patient suffered a subarachnoid hemorrhage and had a subsequent fall.  I discussed the situation with both the patient and her husband.  I explained the serious nature of her condition.  Although she had a severe headache and was mildly somnolent, she was otherwise neurologically intact aside from a very slight left-sided third nerve palsy.  We discussed options for treatment, including both operative and nonoperative measures and also including neurointerventional  measures.  It was my opinion that operative clipping of the aneurysm offered the best longterm solution for her aneurysm and gives her the best longterm chance for a good outcome.  They realize that damage has been done secondary to the subarachnoid hemorrhage.  They also realize that she is at high risk of re-hemorrhaging in the acute phase.  They realize that surgery is not designed to improve her function but rather to prevent her from suffering a re-hemorrhage.  We discussed the risks and benefits of the procedure, including the risks of anesthesia, bleeding, including intraoperative rupture of the aneurysm, infection, stroke, coma, and possibly even death.  The patient and her husband have been given the opportunity to ask questions and appeared to understand. They wished to proceed with surgery.  DESCRIPTION OF PROCEDURE:  The patient was taken to the operating room and placed on the operating table in the supine position.  After an adequate level of anesthesia was achieved, the patients head was fixed in a Sugita head rest with her head turned approximately 60 degrees to the right.  The patients left frontotemporal scalp was prepped and draped sterilely.  A 10 blade is used to make a modified bicoronal scalp incision extending from just beneath the zygoma on the left, just past midline on the right behind the patients hair line.  The scalp flap was reflected anteriorly.  The temporalis muscle was divided and reflected with the scalp flap.  The scalp flap and muscle flap were held in place using the Sugita self-retaining retractor.  A pterional craniotomy was then performed using the  Midas Rex drill.  The sphenoid wing was thinned down using the Midas Rex drill.  Dura was then opened in a curvilinear fashion, hinged along the frontal and temporal _____.  A microscope was brought in the field and used for microdissection of the circle of Willis.  The right frontal lobe was gently  elevated, and the olfactory tract was identified and followed back to the optic nerve.  The optic cistern was opened, and CSF was allowed to drain.  The opticocarotid cistern was opened, and CSF was allowed to drain.  Microdissection then proceeded along the medial border of the left-sided carotid artery.  A section of carotid artery was dissected free for temporary control should intraoperative rupture occur.  Dissection proceeded back to the level of the carotid bifurcation.  At this point, a temporal retractor was placed and the temporal lobe was elevated slightly.  The arachnoid overlying the aneurysm was then dissected free using a _____ dissector.  At this point, the aneurysm wall was very friable, and intraoperative rupture occurred.  This was controlled using a temporary clip occlusion.  Blood loss was considerable but controlled once the temporary clip was applied.  A number of attempts at clip placement were then made. Eventually a right-angle clip was placed across the aneurysm neck and the temporary clip was removed.  This completely obliterated the aneurysm itself. The posterior communicating artery appeared to be spared, as was the anterior choroidal artery.  There was no evidence of any continuous hemorrhage.  The aneurysm was completely obliterated.  The wound was then copiously irrigated with saline solution.  Papaverine was left in the subarachnoid space for treatment of vasospasm.  The total time of temporary occlusion was approximately 10 minutes.  Blood loss was approximately 900 cc.  This was significant but did not require transfusion intraoperatively.  Dura was then reapproximated with 4-0 Nurolon.  Bone flap was replaced using Osteomed flaps. The wound was then closed in layers with Vicryl sutures.  Staples were applied to the surface.  A sterile dressing was applied.  There were no apparent complications aside from the intraoperative rupture.  The patient returns  to the recovery room postoperatively. DD:  11/13/99 TD:  11/13/99 Job: [id number]

## 2010-06-01 NOTE — Discharge Summary (Signed)
[place]  Patient:    Sarah Simon, Sarah Simon Visit Number: [id number] MRN: [id number]          Service Type: MED Attending Physician:  Karn Pickler Dictated by:   Jennette Kettle, M.D. Admit Date:  05/30/2001 Discharge Date: 05/31/2001                             Discharge Summary  DISCHARGE DIAGNOSES:  1. Supratherapeutic INR.  2. Pleurisy secondary to previously diagnosed pulmonary embolus and previous     hospitalization in early May of 2003.  3. History of pulmonary embolus (hospitalized May 5 through May 22, 2001).  4. History of factor V leiden heterozygous state.  5. History of leukocytosis (current hospitalization as well as previous     hospitalization in early May).  6. Polysubstance (current hospitalization positive for cocaine,     benzodiazepines, opiates, THC).  7. History of alcohol abuse (currently approximately six beers per day).  8. 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  9. Status post colectomy secondary to trauma. 10. Status post tubal ligation. 11. 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 12. Status post suicide attempts in the past.  DISCHARGE MEDICATIONS: 1. Coumadin 3 mg p.o. q.d. 2. Vicodin one to two tablets q.6h. p.r.n. pain #60 given.  FOLLOW-UP:  The patient is to follow up in the internal medicine outpatient clinic on Thursday, May 22, at 2 p.m. for blood check and follow-up with Coumadin clinic.  The patient given a telephone number and location of the clinic.  DISCHARGE INSTRUCTIONS:  The patient is strongly advised to discontinue all alcohol use as well as recreational drugs.  The patient understands and states that she will be compliant.  PROCEDURE:  The patient had chest x-ray on May 30, 2001, revealing no acute cardiopulmonary findings.  The patient had a CT with PE protocol on May 30, 2001, in the emergency department.  This revealed resolved right-sided pulmonary emboli, but still with small residual clot in the left lower lung pulmonary artery.  CT of the lower extremities was negative for DVT.  HISTORY OF PRESENT ILLNESS:  Briefly, Sarah Simon is a 50 year old white female who was recently hospitalized in early May and diagnosed with pulmonary embolus and was placed on Coumadin 5 mg p.o. q.d.  The patient was found to be factor V leiden heterozygous state, otherwise normal homosysteine protein C and S as well as ANA and lupus anticoagulant.  The patient was discharged home with an INR of 2.5 on May 9.  The patient initially given Celebrex for pleurisy type pain.  This was changed to Vicodin on May 12 as an outpatient. The patient presented to the emergency room on May 17, complaining of continued pain in the left anterior lateral and posterior walls.  The patient states that she ran out of her Vicodin the day before.  The patient also had complaints of increased gum bleeding as well as easy bruising on her legs. Denied any bright red blood per rectum or melanotic stools.  The patient did have emesis on the day of admission which was gastroccult positive.  The patient denied any fevers, chills, headaches, vision changes, or abdominal discomfort.  The patient did admit to alcohol use of approximately six beers per day for 20+ years.  The patient also with a history  of recent cocaine, marijuana, and benzodiazepine use.  LABORATORY DATA:  The patients initial electrolytes revealed a sodium of 135, potassium 4.3, chloride 107, bicarb 21, BUN 9, creatinine 0.6, glucose 119, bilirubin 0.8, alkaline phosphatase 75, AST 49, ALT 46 (slight elevation, but no significant change from previous discharge in early May), protein 7.4, albumin 3.9, calcium 8.9.  The patients CBC revealed a white count of 19.0, hemoglobin 15.1, platelets 439 with a normal differential.  The  patients coags revealed a PTT of 63, PT 67.8, INR 13.7.  The patients acetominophen level was 4.7 (not toxic level), alcohol level was less than 5.  The patients urine drug screen was positive for benzos, cocaine, opiates, THC.  The patients lipase was 20.  The patients tricyclic screen was negative.  CT of the chest as well as lower extremities and chest x-ray as above.  HOSPITAL COURSE:  #1 - Supratherapeutic INR.  The patient was initially admitted to the hospital secondary to an INR of 13.7, PT 67.8, and PTT 63.  The patient also with easily bruising as well as gastroccult positive hemoptysis.  The patient did have a repeat CT scan in the emergency department by the emergency department physician which revealed resolving right-sided pulmonary embolus, but still with filling defects in the left lower lobe.  There was no evidence of DVT on lower extremities.  The patient was given 0.5 mg of vitamin K in the emergency room and a repeat INR seven hours later revealed an INR decreased to a level of 5.3.  The patient then given 0.25 mg of vitamin K at that time and seven hours later, the patients INR was 2.1.  The patient did have some coughing up of blood and questionable coffeeground emesis of 50 to 100 cc.  The patient remained hemodynamically stable from a vital signs standpoint with blood pressure and heart rate maintained. There was no tachycardia or hypotension. The patients hemoglobin remained stable from the time of admission to the time of discharge.  Discharge hemoglobin was 15.1 compared to admission hemoglobin also fo 15.1.  The patient will be discharged home on Coumadin 3 mg p.o. q.d. which is a decrease from 5 mg previously.  The patient is strongly recommended to alcohol cessation.  The patient understands and states she will be compliant.  The patient is to follow up in the internal medicine outpatient clinic on Thursday May 22, for repeat labs with her coags as well as  follow-up with Coumadin clinic.  #2 - History of pulmonary embolus diagnosed on previous hospitalization in May of 2003.  The patient was discharged to home on Coumadin of 5 mg at her  previous hospital stay with an INR of 2.4.  The patient had a follow-up appointment scheduled for May 19, but subsequently was seen in the emergency room for this admission.  The patient has a history of factor V leiden deficiency heterozygous state, diagnosed at last hospitalization.  The patient needs Coumadin therapy for pulmonary embolus, multiple pulmonary emboli currently.  However, the patients lifestyle consists of alcohol use as well as drug use including cocaine, opiates, marijuana, as well as benzodiazepines. Given the severity of PEs, the risks of future pulmonary embolus compared to bleeding with supratherapeutic INRs were discussed with the patient.  The patient understands and states that she will discontinue all alcohol use as well as drug use.  The patient has been scheduled for a follow-up for lab work on May 22.  The patient will be discharged home  on 3 mg of Coumadin per day.  #3 - Leukocytosis.  The patient initially with a white count of 19.0.  This increased to a value of 22.3 at the time of discharge.  The patient had no fevers, chills, or other evidence of infection such as urinary tract infection or evidence of pneumonia on chest x-ray.  The patients likely source of her leukocytosis stems from her inflammatory state from her pulmonary embolus or from gastric irritation secondary to blood loss with irritation.  Will follow up as an outpatient.  #4 - Polysubstance abuse.  The patients initial urine drug screen was positive for cocaine, benzos, and THC as well as opiates.  The patients opiates is likely secondary to the Vicodin she has been getting as an outpatient.  Strongly encourage drug cessation as an outpatient upon discharge and as an outpatient.  #5 - Pleurisy.  The  patients pain is likely secondary to pleuritic pain secondary to her pulmonary emboli.  Will discharge home on Vicodin one to two tablets q.6h. p.r.n. pain with total of 60 given.  FOLLOW-UP:  The patient has an appointment on May 22, in the internal medicine outpatient clinic for blood work as above.  Thereafter the patient is to follow up with Dr. Olena Leatherwood for further monitoring of the patients medical problems as well as social situation.  DISCHARGE LABORATORY DATA:  The patients PTT at the time of discharge 37, PT 21.3, INR 2.1.  Electrolytes revealed sodium 134, potassium 3.6, chloride 98, bicarb 27, glucose 127, BUN 6, creatinine 0.5, calcium 8.9.  The patients CBC revealed a white count of 22.3, hemoglobin 15.1, platelets 489 with no left shift.  The patient did have an absolute neutrophil count elevated at 16.8, but the differential showed 75% neutrophils, and 15% lymphocytes and 10% monocytes. Dictated by:   Jennette Kettle, M.D. Attending Physician:  Karn Pickler DD:  05/31/01 TD:  06/02/01 Job: [id number]

## 2010-06-25 ENCOUNTER — Ambulatory Visit (INDEPENDENT_AMBULATORY_CARE_PROVIDER_SITE_OTHER): Payer: Self-pay | Admitting: Pharmacist

## 2010-06-25 DIAGNOSIS — I82409 Acute embolism and thrombosis of unspecified deep veins of unspecified lower extremity: Secondary | ICD-10-CM

## 2010-06-25 DIAGNOSIS — Z7901 Long term (current) use of anticoagulants: Secondary | ICD-10-CM

## 2010-06-25 DIAGNOSIS — I2699 Other pulmonary embolism without acute cor pulmonale: Secondary | ICD-10-CM

## 2010-06-25 LAB — POCT INR: INR: 4.4

## 2010-06-25 MED ORDER — WARFARIN SODIUM 2 MG PO TABS: ORAL_TABLET | ORAL | Status: DC

## 2010-06-25 NOTE — Patient Instructions (Signed)
Patient instructed to take medications as defined in the Anti-coagulation Track section of this encounter.  Patient instructed to OMIT today's dose.  Patient verbalized understanding of these instructions.    

## 2010-06-25 NOTE — Progress Notes (Signed)
Anti-Coagulation Progress Note  Sarah Simon is a 50 y.o. female who is currently on an anti-coagulation regimen.    RECENT RESULTS: Recent results are below, the most recent result is correlated with a dose of 13 mg. per week: Lab Results  Component Value Date   INR 4.4 06/25/2010   INR 4.0 05/28/2010   INR 1.5 04/23/2010    ANTI-COAG DOSE:   Latest dosing instructions   Total Sun Mon Tue Wed Thu Fri Sat   10 2 mg 1 mg 2 mg 1 mg 1 mg 2 mg 1 mg    (2 mg1) (2 mg0.5) (2 mg1) (2 mg0.5) (2 mg0.5) (2 mg1) (2 mg0.5)         ANTICOAG SUMMARY: Anticoagulation Episode Summary              Current INR goal 2.0-3.0 Next INR check 07/02/2010   INR from last check 4.4! (06/25/2010)     Weekly max dose (mg)  Target end date Indefinite   Indications PULMONARY EMBOLISM, DVT, Long term current use of anticoagulant   INR check location [facility]    Send INR reminders to [facility]   Comments        Provider Role Specialty Phone number   Blanch Media  Internal Medicine [phone]        ANTICOAG TODAY: Anticoagulation Summary as of 06/25/2010              INR goal 2.0-3.0     Selected INR 4.4! (06/25/2010) Next INR check 07/02/2010   Weekly max dose (mg)  Target end date Indefinite   Indications PULMONARY EMBOLISM, DVT, Long term current use of anticoagulant    Anticoagulation Episode Summary              INR check location [facility]erred lab    Send INR reminders to [name]   Comments        Provider Role Specialty Phone number   Blanch Media  Internal Medicine [phone]        PATIENT INSTRUCTIONS: Patient Instructions  Patient instructed to take medications as defined in the Anti-coagulation Track section of this encounter.  Patient instructed to OMIT today's dose.  Patient verbalized understanding of these instructions.        FOLLOW-UP Return in 7 days (on 07/02/2010) for Follow up INR.  Hulen Luster,  III Pharm.D., CACP

## 2010-07-02 ENCOUNTER — Ambulatory Visit (INDEPENDENT_AMBULATORY_CARE_PROVIDER_SITE_OTHER): Payer: Self-pay | Admitting: Pharmacist

## 2010-07-02 DIAGNOSIS — Z7901 Long term (current) use of anticoagulants: Secondary | ICD-10-CM

## 2010-07-02 DIAGNOSIS — I2699 Other pulmonary embolism without acute cor pulmonale: Secondary | ICD-10-CM

## 2010-07-02 DIAGNOSIS — I82409 Acute embolism and thrombosis of unspecified deep veins of unspecified lower extremity: Secondary | ICD-10-CM

## 2010-07-02 NOTE — Progress Notes (Signed)
Anti-Coagulation Progress Note  Sarah Simon is a 50 y.o. female who is currently on an anti-coagulation regimen.    RECENT RESULTS: Recent results are below, the most recent result is correlated with a dose of 10 mg. per week: Lab Results  Component Value Date   INR 1.80 07/02/2010   INR 4.4 06/25/2010   INR 4.0 05/28/2010    ANTI-COAG DOSE:   Latest dosing instructions   Total Sun Mon Tue Wed Thu Fri Sat   11 2 mg 2 mg 2 mg 1 mg 1 mg 2 mg 1 mg    (2 mg1) (2 mg1) (2 mg1) (2 mg0.5) (2 mg0.5) (2 mg1) (2 mg0.5)         ANTICOAG SUMMARY: Anticoagulation Episode Summary              Current INR goal 2.0-3.0 Next INR check 07/30/2010   INR from last check 1.80! (07/02/2010)     Weekly max dose (mg)  Target end date Indefinite   Indications PULMONARY EMBOLISM, DVT, Long term current use of anticoagulant   INR check location [facility] Pref[facility]    Send INR reminders to [facility]   Comments        Provider Role Specialty Phone number   Blanch Media  Internal Medicine [phone]        ANTICOAG TODAY: Anticoagulation Summary as of 07/02/2010              INR goal 2.0-3.0     Selected INR 1.80! (07/02/2010) Next INR check 07/30/2010   Weekly max dose (mg)  Target end date Indefinite   Indications PULMONARY EMBOLISM, DVT, Long term current use of anticoagulant    Anticoagulation Episode Summary              INR check location [facility] Preferred lab    Send INR reminders to [name]   Comments        Provider Role Specialty Phone number   Blanch Media  Internal Medicine [phone]        PATIENT INSTRUCTIONS: Patient Instructions  Patient instructed to take medications as defined in the Anti-coagulation Track section of this encounter.  Patient instructed to take today's dose.  Patient verbalized understanding of these instructions.        FOLLOW-UP Return in 4 weeks (on 07/30/2010) for Follow up INR.  Hulen Luster,  III Pharm.D., CACP

## 2010-07-02 NOTE — Patient Instructions (Signed)
Patient instructed to take medications as defined in the Anti-coagulation Track section of this encounter.  Patient instructed to take today's dose.  Patient verbalized understanding of these instructions.    

## 2010-07-30 ENCOUNTER — Ambulatory Visit (INDEPENDENT_AMBULATORY_CARE_PROVIDER_SITE_OTHER): Payer: Self-pay | Admitting: Pharmacist

## 2010-07-30 DIAGNOSIS — I82409 Acute embolism and thrombosis of unspecified deep veins of unspecified lower extremity: Secondary | ICD-10-CM

## 2010-07-30 DIAGNOSIS — Z7901 Long term (current) use of anticoagulants: Secondary | ICD-10-CM

## 2010-07-30 DIAGNOSIS — I2699 Other pulmonary embolism without acute cor pulmonale: Secondary | ICD-10-CM

## 2010-07-30 MED ORDER — WARFARIN SODIUM 2 MG PO TABS: ORAL_TABLET | ORAL | Status: DC

## 2010-07-30 NOTE — Patient Instructions (Signed)
Patient instructed to take medications as defined in the Anti-coagulation Track section of this encounter.  Patient instructed to take today's dose.  Patient verbalized understanding of these instructions.    

## 2010-07-30 NOTE — Progress Notes (Signed)
Anti-Coagulation Progress Note  Kendal A Vavrek is a 50 y.o. female who is currently on an anti-coagulation regimen.    RECENT RESULTS: Recent results are below, the most recent result is correlated with a dose of 11 mg. per week: Lab Results  Component Value Date   INR 2.0 07/30/2010   INR 1.80 07/02/2010   INR 4.4 06/25/2010    ANTI-COAG DOSE:   Latest dosing instructions   Total Sun Mon Tue Wed Thu Fri Sat   12 2 mg 2 mg 2 mg 1 mg 2 mg 2 mg 1 mg    (2 mg1) (2 mg1) (2 mg1) (2 mg0.5) (2 mg1) (2 mg1) (2 mg0.5)         ANTICOAG SUMMARY: Anticoagulation Episode Summary              Current INR goal 2.0-3.0 Next INR check 08/27/2010   INR from last check 2.0 (07/30/2010)     Weekly max dose (mg)  Target end date Indefinite   Indications PULMONARY EMBOLISM, DVT, Long term current use of anticoagulant   INR check location [facility]    Send INR reminders to [facility]   Comments        Provider Role Specialty Phone number   Blanch Media  Internal Medicine [phone]        ANTICOAG TODAY: Anticoagulation Summary as of 07/30/2010              INR goal 2.0-3.0     Selected INR 2.0 (07/30/2010) Next INR check 08/27/2010   Weekly max dose (mg)  Target end date Indefinite   Indications PULMONARY EMBOLISM, DVT, Long term current use of anticoagulant    Anticoagulation Episode Summary              INR check location [facility] Pref[facility]    Send INR reminders to [name]   Comments        Provider Role Specialty Phone number   Blanch Media  Internal Medicine [phone]        PATIENT INSTRUCTIONS: Patient Instructions  Patient instructed to take medications as defined in the Anti-coagulation Track section of this encounter.  Patient instructed to take today's dose.  Patient verbalized understanding of these instructions.        FOLLOW-UP Return in 4 weeks (on 08/27/2010) for Follow up INR.  Hulen Luster,  III Pharm.D., CACP

## 2010-08-27 ENCOUNTER — Ambulatory Visit (INDEPENDENT_AMBULATORY_CARE_PROVIDER_SITE_OTHER): Payer: Self-pay | Admitting: Pharmacist

## 2010-08-27 ENCOUNTER — Other Ambulatory Visit: Payer: Self-pay | Admitting: *Deleted

## 2010-08-27 DIAGNOSIS — F411 Generalized anxiety disorder: Secondary | ICD-10-CM

## 2010-08-27 DIAGNOSIS — Z7901 Long term (current) use of anticoagulants: Secondary | ICD-10-CM

## 2010-08-27 DIAGNOSIS — I82409 Acute embolism and thrombosis of unspecified deep veins of unspecified lower extremity: Secondary | ICD-10-CM

## 2010-08-27 DIAGNOSIS — I2699 Other pulmonary embolism without acute cor pulmonale: Secondary | ICD-10-CM

## 2010-08-27 DIAGNOSIS — M23209 Derangement of unspecified meniscus due to old tear or injury, unspecified knee: Secondary | ICD-10-CM

## 2010-08-27 LAB — POCT INR: INR: 1.8

## 2010-08-27 MED ORDER — TRAMADOL HCL 50 MG PO TABS: 100.0000 mg | ORAL_TABLET | Freq: Four times a day (QID) | ORAL | Status: DC | PRN

## 2010-08-27 MED ORDER — CLONAZEPAM 0.5 MG PO TABS: 0.5000 mg | ORAL_TABLET | Freq: Two times a day (BID) | ORAL | Status: DC

## 2010-08-27 NOTE — Progress Notes (Signed)
Anti-Coagulation Progress Note  Jazzy A Vultaggio is a 50 y.o. female who is currently on an anti-coagulation regimen.    RECENT RESULTS: Recent results are below, the most recent result is correlated with a dose of 12 mg. per week: Lab Results  Component Value Date   INR 1.8 08/27/2010   INR 2.0 07/30/2010   INR 1.80 07/02/2010    ANTI-COAG DOSE:   Latest dosing instructions   Total Sun Mon Tue Wed Thu Fri Sat   14 2 mg 2 mg 2 mg 2 mg 2 mg 2 mg 2 mg    (2 mg1) (2 mg1) (2 mg1) (2 mg1) (2 mg1) (2 mg1) (2 mg1)         ANTICOAG SUMMARY: Anticoagulation Episode Summary              Current INR goal 2.0-3.0 Next INR check 09/10/2010   INR from last check 1.8! (08/27/2010)     Weekly max dose (mg)  Target end date Indefinite   Indications PULMONARY EMBOLISM, DVT, Long term current use of anticoagulant   INR check location [facility] Preferred lab    Send INR reminders to [facility]   Comments        Provider Role Specialty Phone number   Blanch Media  Internal Medicine [phone]        ANTICOAG TODAY: Anticoagulation Summary as of 08/27/2010              INR goal 2.0-3.0     Selected INR 1.8! (08/27/2010) Next INR check 09/10/2010   Weekly max dose (mg)  Target end date Indefinite   Indications PULMONARY EMBOLISM, DVT, Long term current use of anticoagulant    Anticoagulation Episode Summary              INR check location [facility] Preferred lab    Send INR reminders to [name]   Comments        Provider Role Specialty Phone number   Blanch Media  Internal Medicine [phone]        PATIENT INSTRUCTIONS: Patient Instructions  Patient instructed to take medications as defined in the Anti-coagulation Track section of this encounter.  Patient instructed to take today's dose.  Patient verbalized understanding of these instructions.        FOLLOW-UP Return in 2 weeks (on 09/10/2010) for Follow up INR.  Hulen Luster,  III Pharm.D., CACP

## 2010-08-27 NOTE — Progress Notes (Signed)
Agree with the above plan of care.

## 2010-08-27 NOTE — Patient Instructions (Signed)
Patient instructed to take medications as defined in the Anti-coagulation Track section of this encounter.  Patient instructed to take today's dose.  Patient verbalized understanding of these instructions.    

## 2010-08-28 NOTE — Telephone Encounter (Signed)
Called to pharm 

## 2010-08-29 ENCOUNTER — Other Ambulatory Visit: Payer: Self-pay | Admitting: *Deleted

## 2010-08-29 MED ORDER — LISINOPRIL 5 MG PO TABS: 5.0000 mg | ORAL_TABLET | Freq: Every day | ORAL | Status: DC

## 2010-08-29 NOTE — Telephone Encounter (Signed)
Was last seen 04/2010 and requested one week F/U. No showed the appt. Will refill one month and request appt.

## 2010-08-29 NOTE — Telephone Encounter (Signed)
Pt has an appt 9/10 w/Dr. Candy Sledge.

## 2010-09-10 ENCOUNTER — Ambulatory Visit (INDEPENDENT_AMBULATORY_CARE_PROVIDER_SITE_OTHER): Payer: Self-pay | Admitting: Pharmacist

## 2010-09-10 ENCOUNTER — Encounter: Payer: Self-pay | Admitting: Internal Medicine

## 2010-09-10 DIAGNOSIS — I82409 Acute embolism and thrombosis of unspecified deep veins of unspecified lower extremity: Secondary | ICD-10-CM

## 2010-09-10 DIAGNOSIS — I2699 Other pulmonary embolism without acute cor pulmonale: Secondary | ICD-10-CM

## 2010-09-10 DIAGNOSIS — Z7901 Long term (current) use of anticoagulants: Secondary | ICD-10-CM

## 2010-09-10 LAB — POCT INR: INR: 3.6

## 2010-09-10 NOTE — Progress Notes (Signed)
Anti-Coagulation Progress Note  Sarah Simon is a 50 y.o. female who is currently on an anti-coagulation regimen.    RECENT RESULTS: Recent results are below, the most recent result is correlated with a dose of 14 mg. per week: Lab Results  Component Value Date   INR 3.6 09/10/2010   INR 1.8 08/27/2010   INR 2.0 07/30/2010    ANTI-COAG DOSE:   Latest dosing instructions   Total Sun Mon Tue Wed Thu Fri Sat   13 2 mg 1 mg 2 mg 2 mg 2 mg 2 mg 2 mg    (2 mg1) (2 mg0.5) (2 mg1) (2 mg1) (2 mg1) (2 mg1) (2 mg1)         ANTICOAG SUMMARY: Anticoagulation Episode Summary              Current INR goal 2.0-3.0 Next INR check 10/08/2010   INR from last check 3.6! (09/10/2010)     Weekly max dose (mg)  Target end date Indefinite   Indications PULMONARY EMBOLISM, DVT, Long term current use of anticoagulant   INR check location [facility]    Send INR reminders to [facility]   Comments        Provider Role Specialty Phone number   Blanch Media  Internal Medicine [phone]        ANTICOAG TODAY: Anticoagulation Summary as of 09/10/2010              INR goal 2.0-3.0     Selected INR 3.6! (09/10/2010) Next INR check 10/08/2010   Weekly max dose (mg)  Target end date Indefinite   Indications PULMONARY EMBOLISM, DVT, Long term current use of anticoagulant    Anticoagulation Episode Summary              INR check location [facility] Preferred lab    Send INR reminders to [facility]   Comments        Provider Role Specialty Phone number   Blanch Media  Internal Medicine [phone]        PATIENT INSTRUCTIONS: Patient Instructions  Patient instructed to take medications as defined in the Anti-coagulation Track section of this encounter.  Patient instructed to take today's dose.  Patient verbalized understanding of these instructions.        FOLLOW-UP Return in 4 weeks (on 10/08/2010) for Follow up INR.  Hulen Luster,  III Pharm.D., CACP

## 2010-09-10 NOTE — Patient Instructions (Signed)
Patient instructed to take medications as defined in the Anti-coagulation Track section of this encounter.  Patient instructed to take today's dose.  Patient verbalized understanding of these instructions.    

## 2010-09-10 NOTE — Progress Notes (Signed)
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  It is unclear per the EPIC documentation if this was a single event or recurrent disease.  There is also mention that she has a Factor V Leiden mutation but I can not find the actual test report and am not sure if it is a heterozygous or homozygous defect.  Therefore, although possibly appropriate, it is unclear per the documentation if prolonged anticoagulation with coumadin is indicated.  Will ask Dr. Candy Sledge to review the venous thromboembolism history with Sarah Simon at her follow-up appointment in September to try to pin down if she has had recurrent disease, unprovoked disease, a previous thrombophilic evaluation, and Factor V homozygosity or heterozygosity of Factor V and Prothrombin mutation heterozygosity together.  Once done will ask that this be clearly documented in the Problem List.

## 2010-09-24 ENCOUNTER — Ambulatory Visit (INDEPENDENT_AMBULATORY_CARE_PROVIDER_SITE_OTHER): Payer: Self-pay | Admitting: Internal Medicine

## 2010-09-24 ENCOUNTER — Encounter: Payer: Self-pay | Admitting: Internal Medicine

## 2010-09-24 DIAGNOSIS — F4321 Adjustment disorder with depressed mood: Secondary | ICD-10-CM

## 2010-09-24 DIAGNOSIS — E785 Hyperlipidemia, unspecified: Secondary | ICD-10-CM

## 2010-09-24 DIAGNOSIS — I10 Essential (primary) hypertension: Secondary | ICD-10-CM

## 2010-09-24 DIAGNOSIS — M25569 Pain in unspecified knee: Secondary | ICD-10-CM

## 2010-09-24 DIAGNOSIS — B182 Chronic viral hepatitis C: Secondary | ICD-10-CM

## 2010-09-24 DIAGNOSIS — F411 Generalized anxiety disorder: Secondary | ICD-10-CM

## 2010-09-24 MED ORDER — NAPROXEN 375 MG PO TABS: 375.0000 mg | ORAL_TABLET | Freq: Three times a day (TID) | ORAL | Status: DC

## 2010-09-24 MED ORDER — LISINOPRIL-HYDROCHLOROTHIAZIDE 10-12.5 MG PO TABS: 1.0000 | ORAL_TABLET | Freq: Every day | ORAL | Status: DC

## 2010-09-24 MED ORDER — FLUOXETINE HCL 20 MG PO CAPS: 20.0000 mg | ORAL_CAPSULE | Freq: Every day | ORAL | Status: DC

## 2010-09-24 MED ORDER — CLONAZEPAM 0.5 MG PO TABS: 0.5000 mg | ORAL_TABLET | Freq: Two times a day (BID) | ORAL | Status: DC

## 2010-09-24 NOTE — Assessment & Plan Note (Addendum)
Given the prior resolution of her knee pain with exercise and wearing the knee brace, we will attempt this solution again. 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 She will also need a new knee brace at that time. This will likely be more affordable to the patient than meniscal surgery. Sarah Simon was informed that the only definitive therapy with the surgery however with medical treatment she may receive an equally satisfying result. A Sports medicine appointment was made with Dr. Domingo Dimes for Friday September 14th at 10 AM. The patient was called and informed of this appointment.  In the meantime we will prescribe Naprosyn 3 times daily to decrease pain and inflammation. She is also to take at least 15 minutes during the day to relax meditate and elevate her knees.

## 2010-09-24 NOTE — Assessment & Plan Note (Addendum)
Ms. Sarah Simon is having many problems with her anxiety. She is currently treating it with clonazepam and alcohol. She does endorse rebound anxiety after using alcohol, as well as early nighttime awakening. These are likely also secondary to her alcohol use.   -- Ms. Sarah Simon was counseled to take at least 15 minutes per day to relax, elevate her knee, and do deep breathing.  She was coached as to the basics of mindfulness therapy and was encouraged to try this out to decrease her stress and anxiety.  -- A prescription for fluoxetine was given; however Ms. Sarah Simon was uncertain as to whether she would fill this medication. She was counseled regarding the risks of potential increased suicidal ideation, sexual dysfunction, and diarrhea. She was told that this medication will likely not work for at least one month but if she does take it it will have a good chance of decreasing her chronic anxiety. Ms. Sarah Simon states she is unlikely to fill this medication.  -- Ms. Sarah Simon was counseled that alcohol use at her current level is unhealthy and is likely exacerbating her anxiety problems. She is to use her nighttime dose of Klonopin at bedtime instead of alcohol. She is to taper use of alcohol with the goal of being off by her followup visit in 2 months.

## 2010-09-24 NOTE — Progress Notes (Signed)
agree

## 2010-09-24 NOTE — Assessment & Plan Note (Signed)
Stable with monitor with CMP today

## 2010-09-24 NOTE — Assessment & Plan Note (Addendum)
Sarah Simon blood pressure is elevated again today. Will increase lisinopril to 10 and change to hctz 12.5 combo pill. We will recheck blood pressure at her appointment in 2 months. She is to call if she experiences any symptoms of dizziness, lightheadedness, shortness of breath, angioedema, or cough.

## 2010-09-24 NOTE — Assessment & Plan Note (Signed)
Lipids are currently at goal as Ms. Sarah Simon  does not have diabetes. We will followup lipid panel testing in December as her previous result was only 9 months ago

## 2010-09-24 NOTE — Patient Instructions (Addendum)
Take naproxin for pain three times daily with meals.  Take Fluoxetine in the mornings for anxiety.  Take your evening dose of clonazepam at bedtime. Do not drink alcohol before bed.  Decrease alcohol intake as best you can  We will call you with your orthopedic appointment to have your backers cyst drained and to get a new knee brace.  Return to clinic in 1-2 months to see Dr. Candy Sledge.  Please see your dermatologist to montior your history of melanoma.

## 2010-09-24 NOTE — Assessment & Plan Note (Signed)
xxx

## 2010-09-24 NOTE — Progress Notes (Signed)
  Subjective:    Patient ID: Sarah Simon, female    DOB: 02-07-1960, 50 y.o.   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 She presents for evaluation of right knee pain.  Sarah Simon states that her knee pain began about 2-3 years ago after "wrenching it" in a fall.  She states that the pain is better in the morning after she has rested and it in the evening it gets worse with activity. It is a dull aching pain with some sharp shooting episodes. She has not fallen. She would like medication for pain relief. She states she was evaluated by a joint specialist who told her she had torn meniscal cartilage and would likely need surgery for definitive repair.  She states that this has been managed conservatively, as she has no insurance.  Sarah Simon reported that with a knee brace and exercise she was able to decrease her pain significantly - almost to nothing. However, as she has a Baker's cyst develop in the right popliteal fossa, she can no longer wear a knee brace. Pain has since returned over the past 2-3 months. She was also previously treated with tramadol 100 mg twice a day though this was causing her dizziness. She has discontinued this medication with resolution of her dizziness.  Sarah Simon states she has chronic anxiety throughout the day. She states that she uses alcohol and clonazepam to treat her anxiety. However after she drinks a beer about 2 hours later she has increased anxiety. She states she has decreased her alcohol use and that 3 beers a day is very good for her. She didn't denies any depression. She also endorses difficulty staying asleep at night and wakes up approximately 4 hours after going to bed. She does endorse using alcohol to help her fall asleep. Upon chart review Sarah Simon has a  history of suicidal attempt. She endorses prior suicidal attempts and ideation.  Sarah Simon states that she checks her blood pressure occasionally when she is refilling her prescriptions. She states that her blood pressures are usually in the 130s over 80s. She states her blood pressures are elevated when she comes to the medical clinic.  Review of Systems  Denies headache, chest pain, shortness of breath, abdominal pain, change in bowel or bladder function, fevers, chills, night sweats, cough. Objective:   Physical Exam   Physical Exam: General: Vital signs reviewed and noted. Well-developed, well-nourished, in no acute distress; alert, appropriate and cooperative throughout examination. Head: Normocephalic, atraumatic. Eyes: PERRL, EOMI, No signs of anemia or jaundince. Throat: Oropharynx nonerythematous, no exudate appreciated.  Neck: No deformities, masses, or tenderness noted.Supple, No carotid Bruits, no JVD. Lungs: Normal respiratory effort. Clear to auscultation BL without crackles or wheezes. Heart: RRR. S1 and S2 normal without gallop, murmur, or rubs. Abdomen: BS normoactive. Soft, Nondistended, non-tender. No masses or organomegaly. Extremities: No pretibial edema. Crepitance in the right knee on flexion and extension. No anterior or posterior drawer signs. No lateral instability. No erythema or effusion noted. The knee is nontender to palpation. Neurologic: A&O X3, CN II - XII are grossly intact. Motor strength is 5/5 in the all 4 extremities, Sensations intact to light touch, Cerebellar signs negative. Skin: No visible rashes, numerous excision scars.         Assessment & Plan:

## 2010-09-25 LAB — COMPLETE METABOLIC PANEL WITH GFR
Alkaline Phosphatase: 67 U/L (ref 39–117)
GFR, Est Non African American: >60 mL/min (ref >60)
Glucose, Bld: 109 mg/dL — ABNORMAL HIGH (ref 70–99)
Sodium: 138 mEq/L (ref 135–145)
Total Bilirubin: 0.9 mg/dL (ref 0.3–1.2)
Total Protein: 8.7 g/dL — ABNORMAL HIGH (ref 6.0–8.3)

## 2010-09-28 ENCOUNTER — Ambulatory Visit (INDEPENDENT_AMBULATORY_CARE_PROVIDER_SITE_OTHER): Payer: Self-pay | Admitting: Family Medicine

## 2010-09-28 VITALS — BP 142/94

## 2010-09-28 DIAGNOSIS — Z7901 Long term (current) use of anticoagulants: Secondary | ICD-10-CM

## 2010-09-28 DIAGNOSIS — S83289A Other tear of lateral meniscus, current injury, unspecified knee, initial encounter: Secondary | ICD-10-CM

## 2010-09-28 DIAGNOSIS — M7120 Synovial cyst of popliteal space [Baker], unspecified knee: Secondary | ICD-10-CM

## 2010-09-28 NOTE — Progress Notes (Signed)
  Subjective:    Patient ID: Sarah Simon, female    DOB: 1960-08-19, 50 y.o.   MRN: [id number]  HPI  Right knee pain for over a month. She's had problems with this knee before and was found to have a small tear in her lateral meniscus. That was a couple of years ago. At that time she had a steroid shot and some fluid drained and her knee had done really well.  A twisting injury cause immediate pain. It swelled up over the next couple of days and has not returned to its normal size. The knee feels stiff. It is painful to go up steps. It aches. She has continued to do her regular daily walking but is painful. She walks 2-5 miles a day. He had tried to wear the brace she has but it was too uncomfortable to put on because the back part of her knees are swollen.  PERTINENT  PMH / PSH: Prior history of right knee lateral meniscus tear. Prior history of Baker's cyst right knee. Chronic anticoagulation with warfarin secondary to recurrent DVT/ pulmonary embolism.. Hepatitis C carrier.  Review of Systems Denies unusual weight change, denies fever or chills. The knee has swollen but there has been no redness or warmth.    Objective:   Physical Exam GENERAL: Well-developed female no acute distress KNEE: Right. Obvious effusion. Tender laterally over the joint line. Lachman reveals solid endpoint consistent with that of the left knee. She did not really tolerate McMurray test. Thesaly positive. Distally she is neurovascularly intact.  ULTRASOUND: Significant effusion in the suprapatellar pouch as well as in the popliteal space. The popliteal artery is seen and well defined. The patellar and quadricep tendon are intact. Medial meniscus is intact. Lateral meniscus appears to be split into. 2 pieces.  ASPIRATION / INJECTION: Patient was given informed consent, signed copy in the chart. Appropriate time out was taken. Area prepped and draped in usual sterile fashion. Local anesthesia with 5 cc of 1%  lidocaine was used in addition to ethyl chloride. Once the portal area was numb, a 60 cc syringe coupled with an 18-gauge 1-1/2 inch needle was inserted into the suprapatellar pouch from acid. Her medial approach. 35 cc of clear yellow fluid was withdrawn. The aching gauge needle was left in place while the 60 cc syringe was removed using sterile hemostats. A 3 cc syringe containing 1 cc of Kenalog 40 was then screwed onto the needle and injected into the intra-articular space. All of them was removed. The patient tolerated procedure wellThere were no complications. Post procedure instructions were given.      Assessment & Plan:  Knee pain and effusion likely secondary to further meniscal damage of the lateral meniscus. At some point I suspect she would benefit from arthroscopy. It present time she has no insurance. We opted to aspirate the knee and try corticosteroid injection. Recommend icing 2-3 times a day for the next 3-5 days. She will return if not improving.

## 2010-10-01 ENCOUNTER — Other Ambulatory Visit: Payer: Self-pay | Admitting: *Deleted

## 2010-10-01 DIAGNOSIS — I82409 Acute embolism and thrombosis of unspecified deep veins of unspecified lower extremity: Secondary | ICD-10-CM

## 2010-10-01 DIAGNOSIS — Z7901 Long term (current) use of anticoagulants: Secondary | ICD-10-CM

## 2010-10-01 DIAGNOSIS — I2699 Other pulmonary embolism without acute cor pulmonale: Secondary | ICD-10-CM

## 2010-10-01 MED ORDER — WARFARIN SODIUM 2 MG PO TABS: ORAL_TABLET | ORAL | Status: DC

## 2010-10-01 NOTE — Telephone Encounter (Signed)
According to the notes from the anticoagulation clinic, patient is on 2 mg tablets; please confirm with the patient and the pharmacy which dose patient is actually taking.

## 2010-10-01 NOTE — Telephone Encounter (Signed)
Pharmacy requests is for Warfarin 1 mg.  Take as directed.  # 60.

## 2010-10-08 ENCOUNTER — Ambulatory Visit (INDEPENDENT_AMBULATORY_CARE_PROVIDER_SITE_OTHER): Payer: Self-pay | Admitting: Pharmacist

## 2010-10-08 DIAGNOSIS — Z7901 Long term (current) use of anticoagulants: Secondary | ICD-10-CM

## 2010-10-08 DIAGNOSIS — I82409 Acute embolism and thrombosis of unspecified deep veins of unspecified lower extremity: Secondary | ICD-10-CM

## 2010-10-08 DIAGNOSIS — I2699 Other pulmonary embolism without acute cor pulmonale: Secondary | ICD-10-CM

## 2010-10-08 LAB — POCT INR: INR: 2.7

## 2010-10-08 NOTE — Progress Notes (Signed)
Anti-Coagulation Progress Note  Sarah Simon is a 50 y.o. female who is currently on an anti-coagulation regimen.    RECENT RESULTS: Recent results are below, the most recent result is correlated with a dose of 13 mg. per week: Lab Results  Component Value Date   INR 2.70 10/08/2010   INR 3.6 09/10/2010   INR 1.8 08/27/2010    ANTI-COAG DOSE:   Latest dosing instructions   Total Sun Mon Tue Wed Thu Fri Sat   13 2 mg 1 mg 2 mg 2 mg 2 mg 2 mg 2 mg    (2 mg1) (2 mg0.5) (2 mg1) (2 mg1) (2 mg1) (2 mg1) (2 mg1)         ANTICOAG SUMMARY: Anticoagulation Episode Summary              Current INR goal 2.0-3.0 Next INR check 11/05/2010   INR from last check 2.70 (10/08/2010)     Weekly max dose (mg)  Target end date Indefinite   Indications PULMONARY EMBOLISM, DVT, Long term current use of anticoagulant   [facility] check location [facility]    Send INR reminders to [facility]   Comments        Provider Role Specialty Phone number   Blanch Media  Internal Medicine [phone]        ANTICOAG TODAY: Anticoagulation Summary as of 10/08/2010              INR goal 2.0-3.0     Selected INR 2.70 (10/08/2010) Next INR check 11/05/2010   Weekly max dose (mg)  Target end date Indefinite   Indications PULMONARY EMBOLISM, DVT, Long term current use of anticoagulant    Anticoagulation Episode Summary              INR check location [facility] Preferred lab    Send INR reminders to [name]   Comments        Provider Role Specialty Phone number   Blanch Media  Internal Medicine [phone]        PATIENT INSTRUCTIONS: Patient Instructions  Patient instructed to take medications as defined in the Anti-coagulation Track section of this encounter.  Patient instructed to take today's dose.  Patient verbalized understanding of these instructions.        FOLLOW-UP Return in 4 weeks (on 11/05/2010) for Follow up INR.  Hulen Luster,  III Pharm.D., CACP

## 2010-10-08 NOTE — Patient Instructions (Signed)
Patient instructed to take medications as defined in the Anti-coagulation Track section of this encounter.  Patient instructed to take today's dose.  Patient verbalized understanding of these instructions.    

## 2010-10-17 LAB — URINALYSIS, ROUTINE W REFLEX MICROSCOPIC
Nitrite: NEGATIVE
Specific Gravity, Urine: >1.03 — ABNORMAL HIGH
Urobilinogen, UA: 0.2

## 2010-10-26 ENCOUNTER — Telehealth: Payer: Self-pay | Admitting: *Deleted

## 2010-10-26 ENCOUNTER — Other Ambulatory Visit: Payer: Self-pay | Admitting: *Deleted

## 2010-10-26 DIAGNOSIS — F411 Generalized anxiety disorder: Secondary | ICD-10-CM

## 2010-10-26 MED ORDER — CLONAZEPAM 0.5 MG PO TABS: 0.5000 mg | ORAL_TABLET | Freq: Two times a day (BID) | ORAL | Status: DC

## 2010-10-26 NOTE — Telephone Encounter (Signed)
Has early Dec appt so will give two month supply. Dr Candy Sledge has documented concern and is actively addressing the issue. PT must keep Dec appt.

## 2010-10-26 NOTE — Telephone Encounter (Signed)
Pt called and left message for rtc, attempted, no answer, lm for rtc

## 2010-10-30 NOTE — Telephone Encounter (Signed)
Rx called in and pt informed 

## 2010-11-05 ENCOUNTER — Ambulatory Visit: Payer: Self-pay

## 2010-11-21 ENCOUNTER — Encounter: Payer: Self-pay | Admitting: Internal Medicine

## 2010-12-20 ENCOUNTER — Encounter: Payer: Self-pay | Admitting: Internal Medicine

## 2010-12-20 ENCOUNTER — Ambulatory Visit (INDEPENDENT_AMBULATORY_CARE_PROVIDER_SITE_OTHER): Payer: Self-pay | Admitting: Internal Medicine

## 2010-12-20 VITALS — BP 128/88 | HR 80 | Temp 97.6°F | Ht 67.75 in | Wt 160.9 lb

## 2010-12-20 DIAGNOSIS — F411 Generalized anxiety disorder: Secondary | ICD-10-CM

## 2010-12-20 DIAGNOSIS — Z7901 Long term (current) use of anticoagulants: Secondary | ICD-10-CM

## 2010-12-20 DIAGNOSIS — M25569 Pain in unspecified knee: Secondary | ICD-10-CM

## 2010-12-20 LAB — COMPLETE METABOLIC PANEL WITH GFR
Alkaline Phosphatase: 70 U/L (ref 39–117)
BUN: 13 mg/dL (ref 6–23)
CO2: 27 mEq/L (ref 19–32)
Creat: 0.56 mg/dL (ref 0.50–1.10)
GFR, Est African American: >89 mL/min
GFR, Est Non African American: >89 mL/min
Glucose, Bld: 85 mg/dL (ref 70–99)
Potassium: 4.1 mEq/L (ref 3.5–5.3)
Sodium: 140 mEq/L (ref 135–145)
Total Bilirubin: 0.5 mg/dL (ref 0.3–1.2)

## 2010-12-20 MED ORDER — CLONAZEPAM 0.5 MG PO TABS: 0.5000 mg | ORAL_TABLET | Freq: Two times a day (BID) | ORAL | Status: DC

## 2010-12-20 MED ORDER — MELOXICAM 7.5 MG PO TABS: 15.0000 mg | ORAL_TABLET | Freq: Every day | ORAL | Status: DC

## 2010-12-20 NOTE — Progress Notes (Signed)
  Subjective:    Patient ID: [name], female    DOB: 1960/04/22, 50 y.o.   MRN: [id number]  HPI  Ms. 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 She presents for follow-up of right knee pain, blood pressure management and alcohol abuse.  Ms. Vollmer states that her knee pain has remained the same since her last visit. She originally injured her leg after "wrenching it" in a fall about 2 years ago.  She went to see the sports medicine doctors who injected the knee and drained her Baker's cyst.  She has been taking naproxen 220mg  BID and states that this has greatly reduced her swelling in the knee.  With the decreased swelling, she is able to wear her knee brace more often now, but does not wear it when she is at home (which is most of the time).  She states that the pain is better in the morning after she has rested and it in the evening it gets worse with activity. She was also previously treated with tramadol 100 mg BID, but discontinued this medication 2/2 dizziness. She states that she has not had much resolution of her pain with the naproxen. She would like a stronger pain medication.  Ms. Laguna states she has chronic anxiety throughout the day. She states that she uses alcohol and clonazepam to treat her anxiety. She has not been able to decrease her alcohol intake since our last meeting. She drinks approximately 6 x 12 oz beers per day. This is an increase from her visit in September.  Ms. Oaxaca has a history of suicidal attempt. She endorses prior suicidal attempts and ideation. She states that she will not take an SSRI for her anxiety because one of the side effects is an increased risk of suicide.  Ms. Narine states that she has had no side effects from the increase of her antihypertensive medications.   Review of  Systems  Denies headache, chest pain, shortness of breath, abdominal pain, change in bowel or bladder function, fevers, chills, night sweats, cough. Objective:   Physical Exam  General: Vital signs reviewed and noted. Well-developed, well-nourished, in no acute distress; alert, appropriate and cooperative throughout examination. Head: Normocephalic, atraumatic. Eyes: EOMI, No signs of anemia or jaundince. Lungs: Normal respiratory effort. Clear to auscultation BL without crackles or wheezes. Heart: RRR. S1 and S2 normal without gallop, murmur, or rubs. Abdomen: BS normoactive. Soft, Nondistended, non-tender. No masses or organomegaly. Extremities: No pretibial edema. Crepitance in the right knee on flexion and extension. No anterior or posterior drawer signs. No lateral instability. No erythema or effusion noted. The knee is nontender to palpation. Neurologic: A&O X3, CN II - XII are grossly intact. No focal neurologic defecit.  Sensations intact to light touch, Cerebellar signs negative. Skin: No visible rashes, numerous excision scars.         Assessment & Plan:

## 2010-12-20 NOTE — Assessment & Plan Note (Signed)
Sarah Simon has had decreased swelling in the knee with addition of regular NSAID use.  I will try meloxicam 15 mg daily for her pain as I am uncomfortable with increasing her naproxen dose too much.  I re-affirmed to her not to take acetaminophen, given her significant alcohol use.  Opiates would be a dangerous combination with her history of polysubstance abuse, current alcohol abuse and depression. I put in a referral to [facility] orthopedics for possible charity care surgery.  I also advised her to use her brace as soon as she gets up in the morning and leave it on all day, as it has worked the best for relieving her pain of everything.

## 2010-12-20 NOTE — Assessment & Plan Note (Signed)
Ms Sturdivant is resistant to seeing a mental health provider for her chronic anxiety.  She also refuses SSRI therapy, which would likely help her as well.  Her alcohol use is a crutch to help her cope with her other issues that she is not willing to deal with at this point.  I told her I would be happy to refer her to a mental health provider and that they would likely be able to treat her anxiety more effectively, but she refused. I will continue current dose of clonazepam, but if she continues to escalate her drinking, this will be unsafe to continue.  I asked her to substitute non-alcoholic beer as she states that it is more the ritual and the tate of the beer that she craves than the actual effects of the alcohol

## 2010-12-20 NOTE — Assessment & Plan Note (Signed)
Sarah Simon missed her last appt with Dr. Alexandria Lodge. She has been stable on her current dose of Warfarin for the last few INRs.  Today her INR is 2.  I will continue her warfarin at the current dose and have her scheudle an appointment with Dr. Alexandria Lodge as soon as possible.

## 2010-12-20 NOTE — Patient Instructions (Addendum)
Try to cut down on your Drinking. A safe level of alcohol is less than 2 drinks per day. Do not drive after drinking or after taking clonazepam. Will put in a referral for you to be seen at [facility] for possible knee surgery. If any of your lab results are abnormal, we will contact you by phone or send you a letter. If they are normal, we will not contact you, but will be happy to discuss them at your next clinic appointment.  Return to clinic to see Dr. Candy Sledge in 6 months Please bring all your medications to your next clinic appointment.   Alcohol Problems Most adults who drink alcohol drink in moderation (not a lot) are at low risk for developing problems related to their drinking. However, all drinkers, including low-risk drinkers, should know about the health risks connected with drinking alcohol. RECOMMENDATIONS FOR LOW-RISK DRINKING  Drink in moderation. Moderate drinking is defined as follows:   Men - no more than 2 drinks per day.   Nonpregnant women - no more than 1 drink per day.   Over age 81 - no more than 1 drink per day.  A standard drink is 12 grams of pure alcohol, which is equal to a 12 ounce bottle of beer or wine cooler, a 5 ounce glass of wine, or 1.5 ounces of distilled spirits (such as whiskey, brandy, vodka, or rum).  ABSTAIN FROM (DO NOT DRINK) ALCOHOL:  When pregnant or considering pregnancy.   When taking a medication that interacts with alcohol.   If you are alcohol dependent.   A medical condition that prohibits drinking alcohol (such as ulcer, liver disease, or heart disease).  DISCUSS WITH YOUR CAREGIVER:  If you are at risk for coronary heart disease, discuss the potential benefits and risks of alcohol use: Light to moderate drinking is associated with lower rates of coronary heart disease in certain populations (for example, men over age 39 and postmenopausal women). Infrequent or nondrinkers are advised not to begin light to moderate drinking to reduce  the risk of coronary heart disease so as to avoid creating an alcohol-related problem. Similar protective effects can likely be gained through proper diet and exercise.   Women and the elderly have smaller amounts of body water than men. As a result women and the elderly achieve a higher blood alcohol concentration after drinking the same amount of alcohol.   Exposing a fetus to alcohol can cause a broad range of birth defects referred to as Fetal Alcohol Syndrome (FAS) or Alcohol-Related Birth Defects (ARBD). Although FAS/ARBD is connected with excessive alcohol consumption during pregnancy, studies also have reported neurobehavioral problems in infants born to mothers reporting drinking an average of 1 drink per day during pregnancy.   Heavier drinking (the consumption of more than 4 drinks per occasion by men and more than 3 drinks per occasion by women) impairs learning (cognitive) and psychomotor functions and increases the risk of alcohol-related problems, including accidents and injuries.  CAGE QUESTIONS:   Have you ever felt that you should Cut down on your drinking?   Have people Annoyed you by criticizing your drinking?   Have you ever felt bad or Guilty about your drinking?   Have you ever had a drink first thing in the morning to steady your nerves or get rid of a hangover (Eye opener)?  If you answered positively to any of these questions: You may be at risk for alcohol-related problems if alcohol consumption is:   Men: Greater  than 14 drinks per week or more than 4 drinks per occasion.   Women: Greater than 7 drinks per week or more than 3 drinks per occasion.  Do you or your family have a medical history of alcohol-related problems, such as:  Blackouts.   Sexual dysfunction.   Depression.   Trauma.   Liver dysfunction.   Sleep disorders.   Hypertension.   Chronic abdominal pain.   Has your drinking ever caused you problems, such as problems with your family,  problems with your work (or school) performance, or accidents/injuries?   Do you have a compulsion to drink or a preoccupation with drinking?   Do you have poor control or are you unable to stop drinking once you have started?   Do you have to drink to avoid withdrawal symptoms?   Do you have problems with withdrawal such as tremors, nausea, sweats, or mood disturbances?   Does it take more alcohol than in the past to get you high?   Do you feel a strong urge to drink?   Do you change your plans so that you can have a drink?   Do you ever drink in the morning to relieve the shakes or a hangover?  If you have answered a number of the previous questions positively, it may be time for you to talk to your caregivers, family, and friends and see if they think you have a problem. Alcoholism is a chemical dependency that keeps getting worse and will eventually destroy your health and relationships. Many alcoholics end up dead, impoverished, or in prison. This is often the end result of all chemical dependency.  Do not be discouraged if you are not ready to take action immediately.   Decisions to change behavior often involve up and down desires to change and feeling like you cannot decide.   Try to think more seriously about your drinking behavior.   Think of the reasons to quit.  WHERE TO GO FOR ADDITIONAL INFORMATION   The National Institute on Alcohol Abuse and Alcoholism (NIAAA)www.niaaa.nih.gov   [employer] on Alcoholism and Drug Dependence (NCADD)www.ncadd.org   American Society of Addiction Medicine (ASAM)www.https://anderson-johnson.com/  Document Released: 12/31/2004 Document Revised: 09/12/2010 Document Reviewed: 08/19/2007 [facility] Patient Information 2012 [place], [place].  Knee Pain The knee is the complex joint between your thigh and your lower leg. It is made up of bones, tendons, ligaments, and cartilage. The bones that make up the knee are:  The femur in the thigh.   The tibia and  fibula in the lower leg.   The patella or kneecap riding in the groove on the lower femur.  CAUSES  Knee pain is a common complaint with many causes. A few of these causes are:  Injury, such as:   A ruptured ligament or tendon injury.   Torn cartilage.   Medical conditions, such as:   Gout   Arthritis   Infections   Overuse, over training or overdoing a physical activity.  Knee pain can be minor or severe. Knee pain can accompany debilitating injury. Minor knee problems often respond well to self-care measures or get well on their own. More serious injuries may need medical intervention or even surgery. SYMPTOMS The knee is complex. Symptoms of knee problems can vary widely. Some of the problems are:  Pain with movement and weight bearing.   Swelling and tenderness.   Buckling of the knee.   Inability to straighten or extend your knee.   Your knee locks and you  cannot straighten it.   Warmth and redness with pain and fever.   Deformity or dislocation of the kneecap.  DIAGNOSIS  Determining what is wrong may be very straight forward such as when there is an injury. It can also be challenging because of the complexity of the knee. Tests to make a diagnosis may include:  Your caregiver taking a history and doing a physical exam.   Routine X-rays can be used to rule out other problems. X-rays will not reveal a cartilage tear. Some injuries of the knee can be diagnosed by:   Arthroscopy a surgical technique by which a small video camera is inserted through tiny incisions on the sides of the knee. This procedure is used to examine and repair internal knee joint problems. Tiny instruments can be used during arthroscopy to repair the torn knee cartilage (meniscus).   Arthrography is a radiology technique. A contrast liquid is directly injected into the knee joint. Internal structures of the knee joint then become visible on X-ray film.   An MRI scan is a non x-ray radiology  procedure in which magnetic fields and a computer produce two- or three-dimensional images of the inside of the knee. Cartilage tears are often visible using an MRI scanner. MRI scans have largely replaced arthrography in diagnosing cartilage tears of the knee.   Blood work.   Examination of the fluid that helps to lubricate the knee joint (synovial fluid). This is done by taking a sample out using a needle and a syringe.  TREATMENT The treatment of knee problems depends on the cause. Some of these treatments are:  Depending on the injury, proper casting, splinting, surgery or physical therapy care will be needed.   Give yourself adequate recovery time. Do not overuse your joints. If you begin to get sore during workout routines, back off. Slow down or do fewer repetitions.   For repetitive activities such as cycling or running, maintain your strength and nutrition.   Alternate muscle groups. For example if you are a weight lifter, work the upper body on one day and the lower body the next.   Either tight or weak muscles do not give the proper support for your knee. Tight or weak muscles do not absorb the stress placed on the knee joint. Keep the muscles surrounding the knee strong.   Take care of mechanical problems.   If you have flat feet, orthotics or special shoes may help. See your caregiver if you need help.   Arch supports, sometimes with wedges on the inner or outer aspect of the heel, can help. These can shift pressure away from the side of the knee most bothered by osteoarthritis.   A brace called an "unloader" brace also may be used to help ease the pressure on the most arthritic side of the knee.   If your caregiver has prescribed crutches, braces, wraps or ice, use as directed. The acronym for this is PRICE. This means protection, rest, ice, compression and elevation.   Nonsteroidal anti-inflammatory drugs (NSAID's), can help relieve pain. But if taken immediately after an  injury, they may actually increase swelling. Take NSAID's with food in your stomach. Stop them if you develop stomach problems. Do not take these if you have a history of ulcers, stomach pain or bleeding from the bowel. Do not take without your caregiver's approval if you have problems with fluid retention, heart failure, or kidney problems.   For ongoing knee problems, physical therapy may be helpful.  Glucosamine and chondroitin are over-the-counter dietary supplements. Both may help relieve the pain of osteoarthritis in the knee. These medicines are different from the usual anti-inflammatory drugs. Glucosamine may decrease the rate of cartilage destruction.   Injections of a corticosteroid drug into your knee joint may help reduce the symptoms of an arthritis flare-up. They may provide pain relief that lasts a few months. You may have to wait a few months between injections. The injections do have a small increased risk of infection, water retention and elevated blood sugar levels.   Hyaluronic acid injected into damaged joints may ease pain and provide lubrication. These injections may work by reducing inflammation. A series of shots may give relief for as long as 6 months.   Topical painkillers. Applying certain ointments to your skin may help relieve the pain and stiffness of osteoarthritis. Ask your pharmacist for suggestions. Many over the-counter products are approved for temporary relief of arthritis pain.   In some countries, doctors often prescribe topical NSAID's for relief of chronic conditions such as arthritis and tendinitis. A review of treatment with NSAID creams found that they worked as well as oral medications but without the serious side effects.  PREVENTION  Maintain a healthy weight. Extra pounds put more strain on your joints.   Get strong, stay limber. Weak muscles are a common cause of knee injuries. Stretching is important. Include flexibility exercises in your workouts.    Be smart about exercise. If you have osteoarthritis, chronic knee pain or recurring injuries, you may need to change the way you exercise. This does not mean you have to stop being active. If your knees ache after jogging or playing basketball, consider switching to swimming, water aerobics or other low-impact activities, at least for a few days a week. Sometimes limiting high-impact activities will provide relief.   Make sure your shoes fit well. Choose footwear that is right for your sport.   Protect your knees. Use the proper gear for knee-sensitive activities. Use kneepads when playing volleyball or laying carpet. Buckle your seat belt every time you drive. Most shattered kneecaps occur in car accidents.   Rest when you are tired.  SEEK MEDICAL CARE IF:  You have knee pain that is continual and does not seem to be getting better.  SEEK IMMEDIATE MEDICAL CARE IF:  Your knee joint feels hot to the touch and you have a high fever. MAKE SURE YOU:   Understand these instructions.   Will watch your condition.   Will get help right away if you are not doing well or get worse.  Document Released: 10/28/2006 Document Revised: 09/12/2010 Document Reviewed: 10/28/2006 [facility] Patient Information 2012 [place], [place].

## 2010-12-21 NOTE — Progress Notes (Signed)
agree

## 2010-12-24 ENCOUNTER — Ambulatory Visit (INDEPENDENT_AMBULATORY_CARE_PROVIDER_SITE_OTHER): Payer: Self-pay | Admitting: Pharmacist

## 2010-12-24 ENCOUNTER — Other Ambulatory Visit: Payer: Self-pay | Admitting: *Deleted

## 2010-12-24 DIAGNOSIS — I2699 Other pulmonary embolism without acute cor pulmonale: Secondary | ICD-10-CM

## 2010-12-24 DIAGNOSIS — I82409 Acute embolism and thrombosis of unspecified deep veins of unspecified lower extremity: Secondary | ICD-10-CM

## 2010-12-24 DIAGNOSIS — Z7901 Long term (current) use of anticoagulants: Secondary | ICD-10-CM

## 2010-12-24 LAB — POCT INR: INR: 2

## 2010-12-24 NOTE — Progress Notes (Signed)
Anti-Coagulation Progress Note  Sarah Simon is a 50 y.o. female who is currently on an anti-coagulation regimen.    RECENT RESULTS: Recent results are below, the most recent result is correlated with a dose of 13 mg. per week: Lab Results  Component Value Date   INR 2.00 12/24/2010   INR 2.70 10/08/2010   INR 3.6 09/10/2010    ANTI-COAG DOSE:   Latest dosing instructions   Total Sun Mon Tue Wed Thu Fri Sat   14 2 mg 2 mg 2 mg 2 mg 2 mg 2 mg 2 mg    (2 mg1) (2 mg1) (2 mg1) (2 mg1) (2 mg1) (2 mg1) (2 mg1)         ANTICOAG SUMMARY: Anticoagulation Episode Summary              Current INR goal 2.0-3.0 Next INR check 01/21/2011   INR from last check 2.00 (12/24/2010)     Weekly max dose (mg)  Target end date Indefinite   Indications PULMONARY EMBOLISM, DVT, Long term current use of anticoagulant   INR check location [facility] lab    Send INR reminders to [facility]   Comments        Provider Role Specialty Phone number   Blanch Media  Internal Medicine [phone]        ANTICOAG TODAY: Anticoagulation Summary as of 12/24/2010              INR goal 2.0-3.0     Selected INR 2.00 (12/24/2010) Next INR check 01/21/2011   Weekly max dose (mg)  Target end date Indefinite   Indications PULMONARY EMBOLISM, DVT, Long term current use of anticoagulant    Anticoagulation Episode Summary              INR check location [facility]erred lab    Send INR reminders to [name]   Comments        Provider Role Specialty Phone number   Blanch Media  Internal Medicine [phone]        PATIENT INSTRUCTIONS: Patient Instructions  Patient instructed to take medications as defined in the Anti-coagulation Track section of this encounter.  Patient instructed to take today's dose.  Patient verbalized understanding of these instructions.        FOLLOW-UP Return in 4 weeks (on 01/21/2011) for Follow up INR.  Hulen Luster,  III Pharm.D., CACP

## 2010-12-24 NOTE — Patient Instructions (Signed)
Patient instructed to take medications as defined in the Anti-coagulation Track section of this encounter.  Patient instructed to take today's dose.  Patient verbalized understanding of these instructions.    

## 2010-12-26 MED ORDER — WARFARIN SODIUM 2 MG PO TABS: ORAL_TABLET | ORAL | Status: DC

## 2011-01-21 ENCOUNTER — Ambulatory Visit: Payer: Self-pay

## 2011-02-01 NOTE — Progress Notes (Signed)
[facility] has appt 02/07/11 9AM Dr Eula Flax [facility]. Stanton Kidney Tashina Credit RN 02/01/11 2PM

## 2011-02-07 ENCOUNTER — Encounter: Payer: Self-pay | Admitting: Internal Medicine

## 2011-02-07 ENCOUNTER — Ambulatory Visit (INDEPENDENT_AMBULATORY_CARE_PROVIDER_SITE_OTHER): Payer: Self-pay | Admitting: Internal Medicine

## 2011-02-07 DIAGNOSIS — F329 Major depressive disorder, single episode, unspecified: Secondary | ICD-10-CM

## 2011-02-07 DIAGNOSIS — Z7901 Long term (current) use of anticoagulants: Secondary | ICD-10-CM

## 2011-02-07 DIAGNOSIS — I10 Essential (primary) hypertension: Secondary | ICD-10-CM

## 2011-02-07 DIAGNOSIS — F411 Generalized anxiety disorder: Secondary | ICD-10-CM

## 2011-02-07 DIAGNOSIS — F17200 Nicotine dependence, unspecified, uncomplicated: Secondary | ICD-10-CM

## 2011-02-07 DIAGNOSIS — I2699 Other pulmonary embolism without acute cor pulmonale: Secondary | ICD-10-CM

## 2011-02-07 DIAGNOSIS — M25569 Pain in unspecified knee: Secondary | ICD-10-CM

## 2011-02-07 DIAGNOSIS — M222X1 Patellofemoral disorders, right knee: Secondary | ICD-10-CM | POA: Insufficient documentation

## 2011-02-07 DIAGNOSIS — F1010 Alcohol abuse, uncomplicated: Secondary | ICD-10-CM

## 2011-02-07 NOTE — Progress Notes (Signed)
  Subjective:    Patient ID: Sarah Simon, female    DOB: 07/20/1960, 51 y.o.   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 She presents for follow-up of right knee pain,alcohol abuse and to get disability paperwork.  Sarah Simon states that her knee pain has improved since her last visit. She originally injured her leg after "wrenching it" in a fall about 2 years ago.  She is taking Aleve 500 mg BID with satisfactory results. She states the pain is worse with the rain, in the evening and with activity. She was also previously treated with tramadol 100 mg BID, but discontinued this medication 2/2 dizziness. She never filled the prescription I gave her for meloxicam. She has not had any blood in her stool or dark stools.   Today Sarah Simon went to the [facility] orthopedic surgery referral. She states that the surgeon told her she did not need surgery for her knee. He told her she needs to do exercises to strengthen her knee. He gave her a list of exercises to do and told her to go see a physical therapist.  Sarah Simon has missed her last appointment with Dr. Alexandria Lodge. She was able to get an old prescription for warfarin filled and is good for the rest of the month, however she will need a prescription after that.  As far as her anxiety and alcohol abuse she has cut down her drinking to 3 beers per day. She states that she has not tried nonalcoholic beers and completely forgot that we had talked about that. She states she is sleeping well through the night.    Review of Systems  Denies headache, chest pain, shortness of breath, abdominal pain, change in bowel or bladder function, fevers, chills, night sweats, cough. Objective:   Physical Exam  General: Vital signs reviewed and noted.  Well-developed, well-nourished, in no acute distress; alert, appropriate and cooperative throughout examination. Head: Normocephalic, atraumatic. Eyes: EOMI, No signs of anemia or jaundince. Lungs: Normal respiratory effort. Clear to auscultation BL without crackles or wheezes. Heart: RRR. S1 and S2 normal without gallop, murmur, or rubs. Abdomen: BS normoactive. Soft, Nondistended, non-tender. No masses or organomegaly. Extremities: No pretibial edema. Crepitus in the right knee on flexion and extension. No anterior or posterior drawer signs. No lateral instability. No erythema or effusion noted. The knee is nontender to palpation. Neurologic: A&O X3, CN II - XII are grossly intact. No focal neurologic defecit.  Sensations intact to light touch, Cerebellar signs negative. Skin: No visible rashes, numerous excision scars.         Assessment & Plan:

## 2011-02-07 NOTE — Assessment & Plan Note (Signed)
Sarah Simon states her knee pain is doing well. She is excited to learn that she does not need surgery. I made the referral to physical therapy today. As she is also on a nonsteroidal anti-inflammatory with alcohol abuse and on warfarin we will check CBC today to make sure she is not anemic.

## 2011-02-07 NOTE — Assessment & Plan Note (Signed)
Sarah Simon states she will never take an SSRI. I removed fluoxetine from her med list. She again denied referral to behavior health. I sincerely believe that she will be better managed in the long run by a mental health professional. I await the day when she is finally ready to address her mental health issues.

## 2011-02-07 NOTE — Assessment & Plan Note (Addendum)
Sarah Simon is not complaining of her anxiety. She's also been able to decrease her alcohol intake which is one of her ways of dealing with her anxiety. I have asked her to continue to try to decrease her alcohol use. Since her husband was here at this visit I mentioned a referral to mental health but did not push the issue as he is quite overbearing and dismissive of this idea.

## 2011-02-07 NOTE — Assessment & Plan Note (Signed)
I counseled Ms. Sarah Simon regarding decreasing her alcohol intake. Even 3 beers per day is too much for woman of her size. I also counseled her that gastritis with alcohol use and nonsteroidal anti-inflammatory use for her knee pain his risk. She also is at risk for GI bleed due to warfarin.

## 2011-02-07 NOTE — Patient Instructions (Addendum)
To start the process of getting disability, call the [employer] office.  Call [phone] or [phone] for an appointment and for the required paperwork.  We will fax them the records after they request them. Their address is [phone] [facility] [place], [place]. You can also talk to [employer] in our office to see if she has any applications.   Make an appointment with Dr. Alexandria Lodge in the warfarin clinic to be seen in the next 1-2 weeks.  If any of your lab results are abnormal we will contact you by phone or send you a letter. If they are normal, we will not contact you, but will be happy to discuss them at your next clinic appointment.  Return to clinic to see Dr. Candy Sledge in 6-7 months Please bring all your medications to your next clinic appointment.     Gastritis Gastritis is an inflammation (the body's way of reacting to injury and/or infection) of the stomach. It is often caused by viral or bacterial (germ) infections. It can also be caused by chemicals (including alcohol) and medications. This illness may be associated with generalized malaise (feeling tired, not well), cramps, and fever. The illness may last 2 to 7 days. If symptoms of gastritis continue, gastroscopy (looking into the stomach with a telescope-like instrument), biopsy (taking tissue samples), and/or blood tests may be necessary to determine the cause. Antibiotics will not affect the illness unless there is a bacterial infection present. One common bacterial cause of gastritis is an organism known as H. Pylori. This can be treated with antibiotics. Other forms of gastritis are caused by too much acid in the stomach. They can be treated with medications such as H2 blockers and antacids. Home treatment is usually all that is needed. Young children will quickly become dehydrated (loss of body fluids) if vomiting and diarrhea are both present. Medications may be given to control nausea. Medications are usually  not given for diarrhea unless especially bothersome. Some medications slow the removal of the virus from the gastrointestinal tract. This slows down the healing process. HOME CARE INSTRUCTIONS Home care instructions for nausea and vomiting:  For adults: drink small amounts of fluids often. Drink at least 2 quarts a day. Take sips frequently. Do not drink large amounts of fluid at one time. This may worsen the nausea.   Only take over-the-counter or prescription medicines for pain, discomfort, or fever as directed by your caregiver.   Drink clear liquids only. Those are anything you can see through such as water, broth, or soft drinks.   Once you are keeping clear liquids down, you may start full liquids, soups, juices, and ice cream or sherbet. Slowly add bland (plain, not spicy) foods to your diet.  Home care instructions for diarrhea:  Diarrhea can be caused by bacterial infections or a virus. Your condition should improve with time, rest, fluids, and/or anti-diarrheal medication.   Until your diarrhea is under control, you should drink clear liquids often in small amounts. Clear liquids include: water, broth, jell-o water and weak tea.  Avoid:  Milk.   Fruits.   Tobacco.   Alcohol.   Extremely hot or cold fluids.   Too much intake of anything at one time.  When your diarrhea stops you may add the following foods, which help the stool to become more formed:  Rice.   Bananas.   Apples without skin.   Dry toast.  Once these foods are tolerated you may add low-fat yogurt and low-fat  cottage cheese. They will help to restore the normal bacterial balance in your bowel. Wash your hands well to avoid spreading bacteria (germ) or virus. SEEK IMMEDIATE MEDICAL CARE IF:   You are unable to keep fluids down.   Vomiting or diarrhea become persistent (constant).   Abdominal pain develops, increases, or localizes. (Right sided pain can be appendicitis. Left sided pain in adults can  be diverticulitis.)   You develop a fever (an oral temperature above 102 F (38.9 C)).   Diarrhea becomes excessive or contains blood or mucus.   You have excessive weakness, dizziness, fainting or extreme thirst.   You are not improving or you are getting worse.   You have any other questions or concerns.  Document Released: 12/25/2000 Document Revised: 09/12/2010 Document Reviewed: 12/31/2004 [facility] Patient Information 2012 [place], [place].

## 2011-02-07 NOTE — Assessment & Plan Note (Signed)
Sarah Simon INR today is 2.3. She can continue her current dose of warfarin. I instructed her to make an appointment with Dr. Alexandria Lodge as she checked out today to be seen in the next 1To 2 weeks.

## 2011-02-07 NOTE — Assessment & Plan Note (Signed)
Pressure is well-controlled today. She reports no side effects from lisinopril HCTZ. We will continue this medication.

## 2011-02-08 LAB — CBC WITH DIFFERENTIAL/PLATELET
Basophils Relative: 1 % (ref 0–1)
Eosinophils Absolute: 0.2 10*3/uL (ref 0.0–0.7)
Eosinophils Relative: 2 % (ref 0–5)
HCT: 41.2 % (ref 36.0–46.0)
Hemoglobin: 14.4 g/dL (ref 12.0–15.0)
Lymphs Abs: 5.7 10*3/uL — ABNORMAL HIGH (ref 0.7–4.0)
MCH: 34.1 pg — ABNORMAL HIGH (ref 26.0–34.0)
MCHC: 35 g/dL (ref 30.0–36.0)
MCV: 97.6 fL (ref 78.0–100.0)
Monocytes Absolute: 1.4 10*3/uL — ABNORMAL HIGH (ref 0.1–1.0)
Monocytes Relative: 11 % (ref 3–12)
Neutrophils Relative %: 43 % (ref 43–77)
RBC: 4.22 MIL/uL (ref 3.87–5.11)

## 2011-02-18 ENCOUNTER — Ambulatory Visit (INDEPENDENT_AMBULATORY_CARE_PROVIDER_SITE_OTHER): Payer: Self-pay | Admitting: Pharmacist

## 2011-02-18 DIAGNOSIS — Z7901 Long term (current) use of anticoagulants: Secondary | ICD-10-CM

## 2011-02-18 DIAGNOSIS — I82409 Acute embolism and thrombosis of unspecified deep veins of unspecified lower extremity: Secondary | ICD-10-CM

## 2011-02-18 DIAGNOSIS — I2699 Other pulmonary embolism without acute cor pulmonale: Secondary | ICD-10-CM

## 2011-02-18 NOTE — Patient Instructions (Signed)
Patient instructed to take medications as defined in the Anti-coagulation Track section of this encounter.  Patient instructed to take today's dose.  Patient verbalized understanding of these instructions.    

## 2011-02-18 NOTE — Progress Notes (Signed)
Anti-Coagulation Progress Note  Sarah Simon is a 51 y.o. female who is currently on an anti-coagulation regimen.    RECENT RESULTS: Recent results are below, the most recent result is correlated with a dose of 14 mg. per week: Lab Results  Component Value Date   INR 2.30 02/18/2011   INR 2.3 02/07/2011   INR 2.00 12/24/2010    ANTI-COAG DOSE:   Latest dosing instructions   Total Sun Mon Tue Wed Thu Fri Sat   14 2 mg 2 mg 2 mg 2 mg 2 mg 2 mg 2 mg    (2 mg1) (2 mg1) (2 mg1) (2 mg1) (2 mg1) (2 mg1) (2 mg1)         ANTICOAG SUMMARY: Anticoagulation Episode Summary              Current INR goal 2.0-3.0 Next INR check 03/25/2011   INR from last check 2.30 (02/18/2011)     Weekly max dose (mg)  Target end date Indefinite   Indications PULMONARY EMBOLISM, DVT, Long term current use of anticoagulant   [facility] check location [facility]    Send INR reminders to [name]   Comments        Provider Role Specialty Phone number   Blanch Media, MD  Internal Medicine [phone]        ANTICOAG TODAY: Anticoagulation Summary as of 02/18/2011              INR goal 2.0-3.0     Selected INR 2.30 (02/18/2011) Next INR check 03/25/2011   Weekly max dose (mg)  Target end date Indefinite   Indications PULMONARY EMBOLISM, DVT, Long term current use of anticoagulant    Anticoagulation Episode Summary              INR check location [facility] Preferred lab    Send INR reminders to [name]   Comments        Provider Role Specialty Phone number   Blanch Media, MD  Internal Medicine [phone]        PATIENT INSTRUCTIONS: Patient Instructions  Patient instructed to take medications as defined in the Anti-coagulation Track section of this encounter.  Patient instructed to take today's dose.  Patient verbalized understanding of these instructions.        FOLLOW-UP Return in 5 weeks (on 03/25/2011) for Follow up INR.  Hulen Luster,  III Pharm.D., CACP

## 2011-02-19 ENCOUNTER — Telehealth: Payer: Self-pay | Admitting: *Deleted

## 2011-02-19 NOTE — Telephone Encounter (Signed)
SPOKE WITH THE PT OFFICE TO FOLLOWUP ON REFERRAL, PATIENT HAS NOT YET BEEN SCHEDULED WITH  A APPT. LELA STURDIVANT NTII 2-5-013  2:31PM

## 2011-02-27 NOTE — Progress Notes (Signed)
Addended by: Neomia Dear on: 02/27/2011 06:57 PM   Modules accepted: Orders

## 2011-03-06 ENCOUNTER — Telehealth: Payer: Self-pay | Admitting: *Deleted

## 2011-03-06 NOTE — Telephone Encounter (Signed)
CALLED TO FOLLOW UP ON REHAB REFERRAL, NEVER GOT CALL BACK. SPOKE WITH KISHA, OFFICE CALLED PATIENT / PHONE NUMBER NO LONGER IN SERVICE.Marland KitchenWILL SEE IF THERE IS ANY OTHER CONTACT PHONE NUMBER FOR THIS PATIENT.  Sarah Simon NTII  2-20-013  9:57AM

## 2011-03-25 ENCOUNTER — Ambulatory Visit (INDEPENDENT_AMBULATORY_CARE_PROVIDER_SITE_OTHER): Payer: Self-pay | Admitting: Pharmacist

## 2011-03-25 DIAGNOSIS — I2699 Other pulmonary embolism without acute cor pulmonale: Secondary | ICD-10-CM

## 2011-03-25 DIAGNOSIS — I82409 Acute embolism and thrombosis of unspecified deep veins of unspecified lower extremity: Secondary | ICD-10-CM

## 2011-03-25 DIAGNOSIS — Z7901 Long term (current) use of anticoagulants: Secondary | ICD-10-CM

## 2011-03-25 MED ORDER — WARFARIN SODIUM 2 MG PO TABS: ORAL_TABLET | ORAL | Status: DC

## 2011-03-25 NOTE — Progress Notes (Signed)
Agree with Dr. Groce's assessment and management plan. 

## 2011-03-25 NOTE — Progress Notes (Signed)
Anti-Coagulation Progress Note  Sarah Simon is a 51 y.o. female who is currently on an anti-coagulation regimen.    RECENT RESULTS: Recent results are below, the most recent result is correlated with a dose of 14 mg. per week: Lab Results  Component Value Date   INR 2.20 03/25/2011   INR 2.30 02/18/2011   INR 2.3 02/07/2011    ANTI-COAG DOSE:   Latest dosing instructions   Total Sun Mon Tue Wed Thu Fri Sat   14 2 mg 2 mg 2 mg 2 mg 2 mg 2 mg 2 mg    (2 mg1) (2 mg1) (2 mg1) (2 mg1) (2 mg1) (2 mg1) (2 mg1)         ANTICOAG SUMMARY: Anticoagulation Episode Summary              Current INR goal 2.0-3.0 Next INR check 04/22/2011   INR from last check 2.20 (03/25/2011)     Weekly max dose (mg)  Target end date Indefinite   Indications PULMONARY EMBOLISM, DVT, Long term current use of anticoagulant   INR check location [facility] Preferred lab    Send INR reminders to [name]   Comments        Provider Role Specialty Phone number   Burns Spain, MD  Internal Medicine [phone]        ANTICOAG TODAY: Anticoagulation Summary as of 03/25/2011              INR goal 2.0-3.0     Selected INR 2.20 (03/25/2011) Next INR check 04/22/2011   Weekly max dose (mg)  Target end date Indefinite   Indications PULMONARY EMBOLISM, DVT, Long term current use of anticoagulant    Anticoagulation Episode Summary              INR check location [facility] Preferred lab    Send INR reminders to [name]   Comments        Provider Role Specialty Phone number   Burns Spain, MD  Internal Medicine [phone]        PATIENT INSTRUCTIONS: Patient Instructions  Patient instructed to take medications as defined in the Anti-coagulation Track section of this encounter.  Patient instructed to take today's dose.  Patient verbalized understanding of these instructions.        FOLLOW-UP Return in about 4 weeks (around 04/22/2011) for Follow up  INR.  Hulen Luster, III Pharm.D., CACP

## 2011-03-25 NOTE — Patient Instructions (Signed)
Patient instructed to take medications as defined in the Anti-coagulation Track section of this encounter.  Patient instructed to take today's dose.  Patient verbalized understanding of these instructions.    

## 2011-04-22 ENCOUNTER — Ambulatory Visit: Payer: Self-pay

## 2011-05-07 ENCOUNTER — Other Ambulatory Visit: Payer: Self-pay | Admitting: *Deleted

## 2011-05-07 DIAGNOSIS — F411 Generalized anxiety disorder: Secondary | ICD-10-CM

## 2011-05-07 MED ORDER — CLONAZEPAM 0.5 MG PO TABS: 0.5000 mg | ORAL_TABLET | Freq: Two times a day (BID) | ORAL | Status: DC

## 2011-05-07 NOTE — Telephone Encounter (Signed)
Call pt when ready @ [phone]

## 2011-05-08 NOTE — Telephone Encounter (Signed)
Rx called in and pt informed 

## 2011-05-15 ENCOUNTER — Other Ambulatory Visit: Payer: Self-pay | Admitting: Internal Medicine

## 2011-05-15 NOTE — Telephone Encounter (Signed)
Patient needs to follow up with Dr. Alexandria Lodge for INR check. She missed her last appointment

## 2011-05-16 NOTE — Telephone Encounter (Signed)
Appt was scheduled for 5/6 w/Dr Alexandria Lodge.

## 2011-05-20 ENCOUNTER — Ambulatory Visit (INDEPENDENT_AMBULATORY_CARE_PROVIDER_SITE_OTHER): Payer: Self-pay

## 2011-05-20 DIAGNOSIS — I82409 Acute embolism and thrombosis of unspecified deep veins of unspecified lower extremity: Secondary | ICD-10-CM

## 2011-05-20 DIAGNOSIS — I2699 Other pulmonary embolism without acute cor pulmonale: Secondary | ICD-10-CM

## 2011-05-20 DIAGNOSIS — Z7901 Long term (current) use of anticoagulants: Secondary | ICD-10-CM

## 2011-05-27 IMAGING — CR DG KNEE 1-2V*R*
2 series · 2 of 2 positions shown · non-contrast
Comparison: None

CLINICAL DATA: Posterior lateral knee pain

RIGHT KNEE - 1-2 VIEW

[t knee ap right]
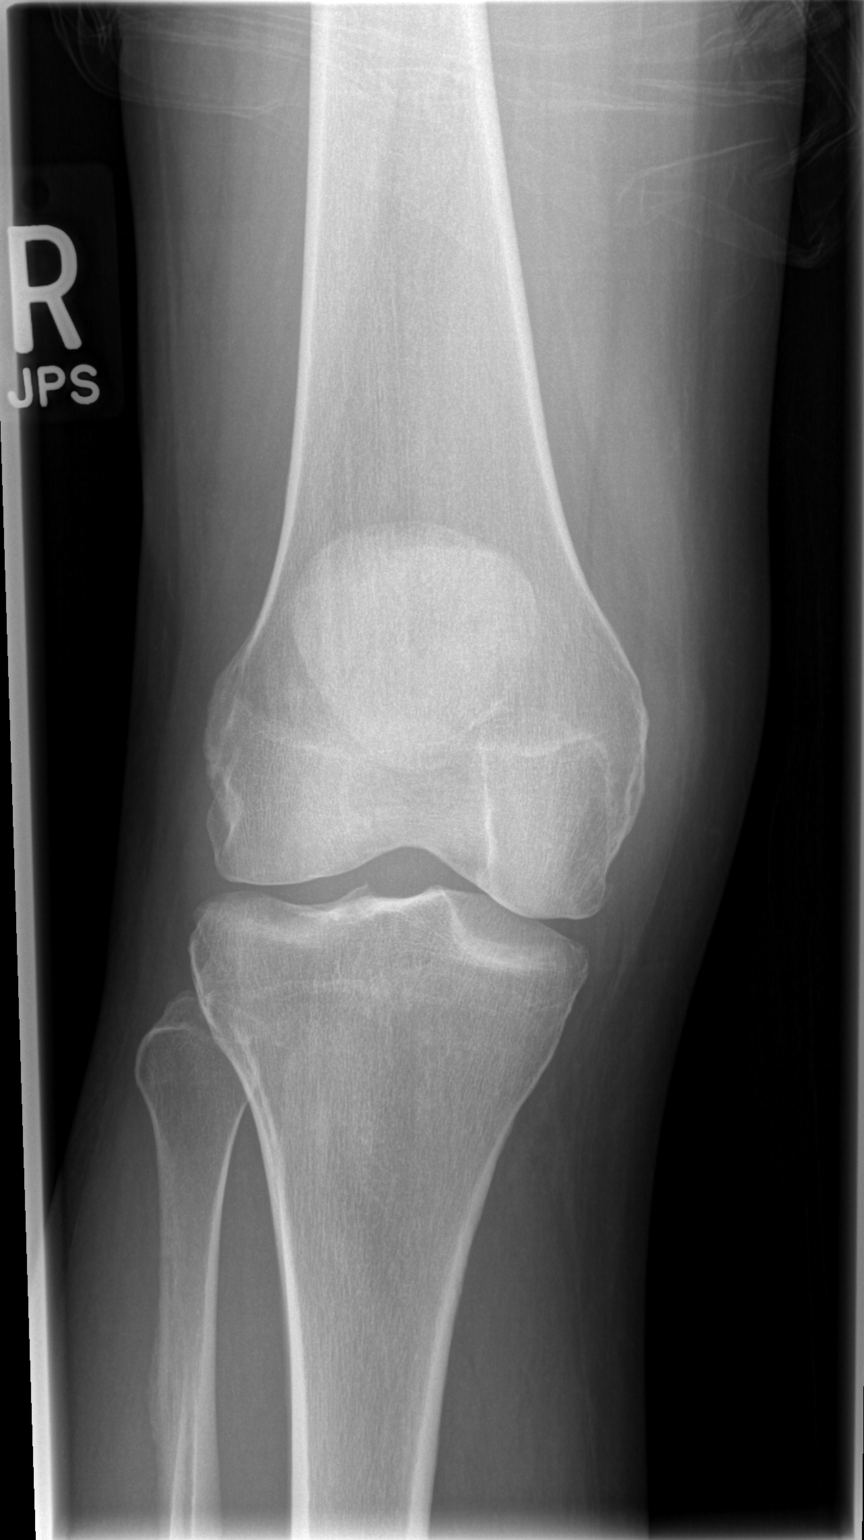

[t knee lat right]
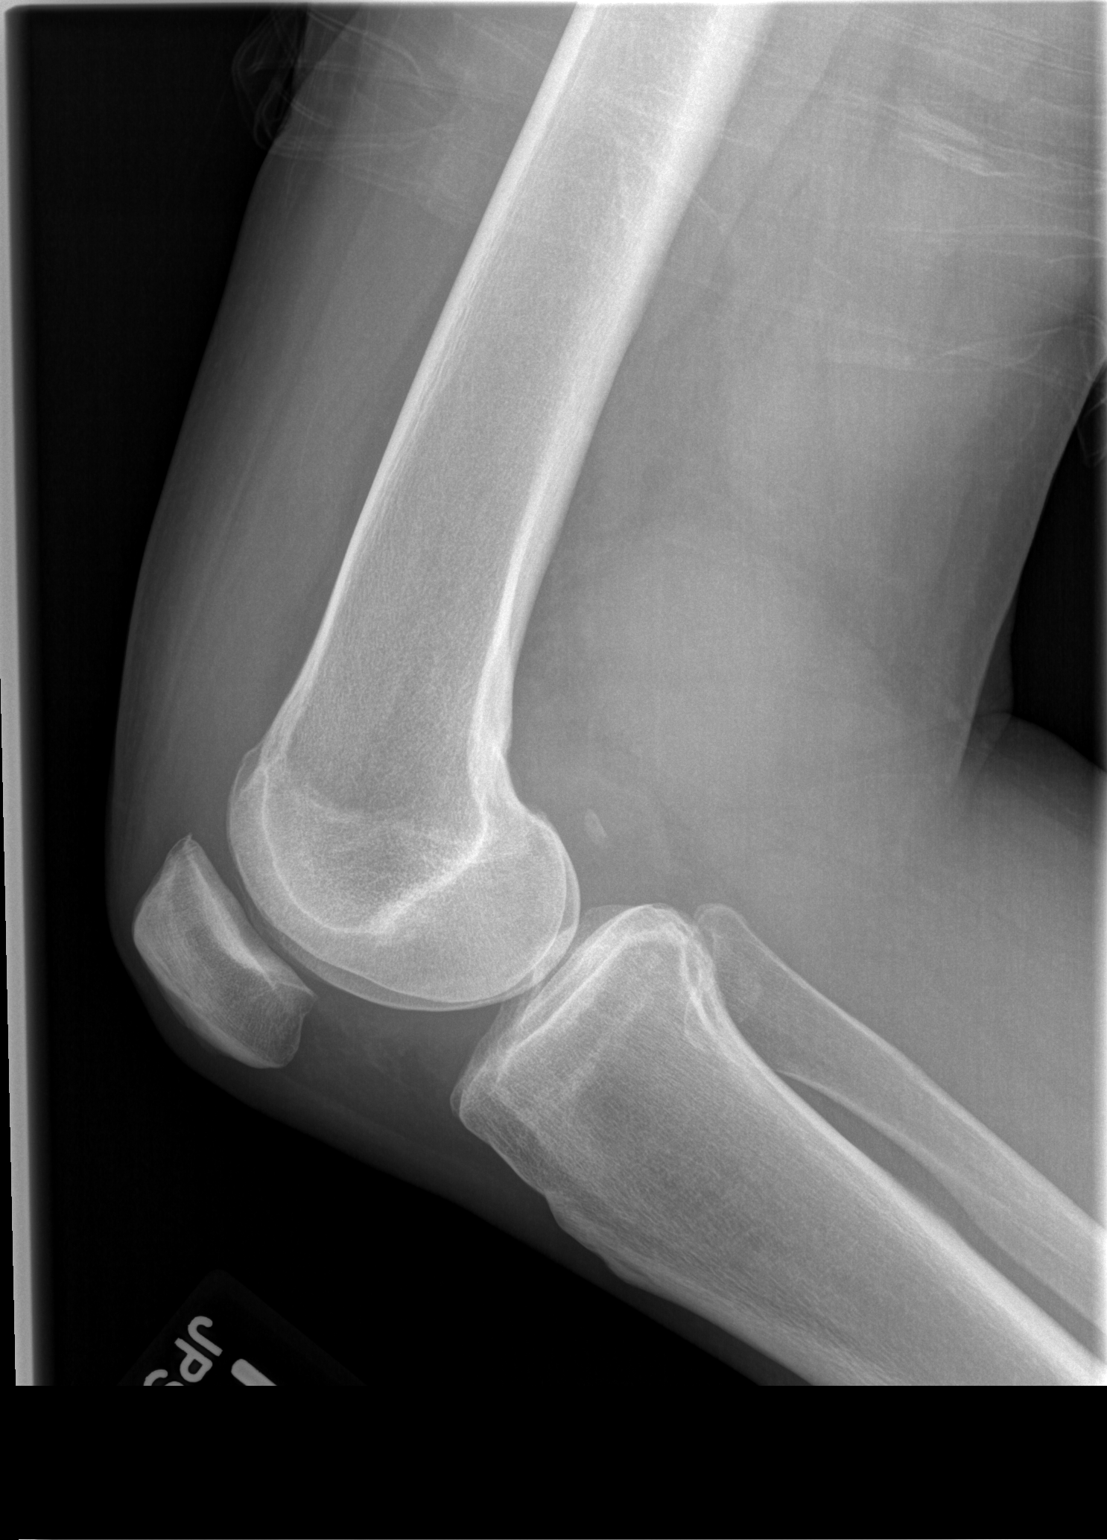

[2 of 2 positions shown; findings below may reference images not displayed]

FINDINGS: No evidence of fracture or dislocation of the right knee.
There is a small suprapatellar joint effusion.
IMPRESSION: 1.  No significant osseous abnormality.
2.  Small suprapatellar joint effusion.

## 2011-06-03 ENCOUNTER — Other Ambulatory Visit (INDEPENDENT_AMBULATORY_CARE_PROVIDER_SITE_OTHER): Payer: Self-pay

## 2011-06-03 DIAGNOSIS — Z7901 Long term (current) use of anticoagulants: Secondary | ICD-10-CM

## 2011-06-03 DIAGNOSIS — I2699 Other pulmonary embolism without acute cor pulmonale: Secondary | ICD-10-CM

## 2011-06-03 DIAGNOSIS — I82409 Acute embolism and thrombosis of unspecified deep veins of unspecified lower extremity: Secondary | ICD-10-CM

## 2011-06-03 LAB — POCT INR: INR: 2.1

## 2011-06-03 NOTE — Progress Notes (Signed)
INR  Date Value Range Status  06/03/2011 2.1   Final     Results given to Dr. Meredith Pel by Charlyne Quale at 10:20am     Patient walked in for INR check; Dr. Alexandria Lodge is away today.  INR is therapeutic at 2.1; plan is continue current warfarin regimen and patient to F/U with Dr. Alexandria Lodge.

## 2011-06-05 ENCOUNTER — Encounter: Payer: Self-pay | Admitting: Internal Medicine

## 2011-06-05 ENCOUNTER — Ambulatory Visit (INDEPENDENT_AMBULATORY_CARE_PROVIDER_SITE_OTHER): Payer: Self-pay | Admitting: Internal Medicine

## 2011-06-05 VITALS — BP 147/84 | HR 62 | Temp 97.9°F | Ht 68.0 in | Wt 166.1 lb

## 2011-06-05 DIAGNOSIS — Z7901 Long term (current) use of anticoagulants: Secondary | ICD-10-CM

## 2011-06-05 DIAGNOSIS — F1010 Alcohol abuse, uncomplicated: Secondary | ICD-10-CM

## 2011-06-05 DIAGNOSIS — R600 Localized edema: Secondary | ICD-10-CM | POA: Insufficient documentation

## 2011-06-05 DIAGNOSIS — I10 Essential (primary) hypertension: Secondary | ICD-10-CM

## 2011-06-05 DIAGNOSIS — Z85820 Personal history of malignant melanoma of skin: Secondary | ICD-10-CM

## 2011-06-05 DIAGNOSIS — R609 Edema, unspecified: Secondary | ICD-10-CM

## 2011-06-05 MED ORDER — WARFARIN SODIUM 2 MG PO TABS: 2.0000 mg | ORAL_TABLET | Freq: Every day | ORAL | Status: DC

## 2011-06-05 MED ORDER — HYDROCHLOROTHIAZIDE 25 MG PO TABS: 25.0000 mg | ORAL_TABLET | Freq: Every day | ORAL | Status: DC

## 2011-06-05 MED ORDER — LISINOPRIL 10 MG PO TABS: 10.0000 mg | ORAL_TABLET | Freq: Every day | ORAL | Status: DC

## 2011-06-05 NOTE — Patient Instructions (Addendum)
Continue taking warfarin as Dr. Alexandria Lodge tells you. Please make an appointment to see Dr. Alexandria Lodge next Monday.    Start taking lisinopril 10 mg and hydrochlorothiazide 25 mg  Stop taking lisinopril-hydrochlorothiazide 10-12.5 MG per tablet  If any of your lab results are abnormal, we will contact you by phone or send you a letter. If they are normal, we will not contact you, but will be happy to discuss them at your next clinic appointment.  Return to clinic to see Dr. Candy Sledge in 1 month Please bring all your medications to your next clinic appointment.    Edema Edema is a buildup of fluids. It is most common in the feet, ankles, and legs. This happens more as a person ages. It may affect one or both legs. HOME CARE   Raise (elevate) the legs or ankles above the level of the heart while lying down.   Avoid sitting or standing still for a long time.   Exercise the legs to help the puffiness (swelling) go down.   A low-salt diet may help lessen the puffiness.   Only take medicine as told by your doctor.  GET HELP RIGHT AWAY IF:   You develop shortness of breath or chest pain.   You cannot breathe when you lie down.   You have more puffiness that does not go away with treatment.   You develop pain or redness in the areas that are puffy.   You have a temperature by mouth above 102 F (38.9 C), not controlled by medicine.   You gain 3 lb/1.4 kg or more in 1 day or 5 lb/2.3 kg in a week.  MAKE SURE YOU:   Understand these instructions.   Will watch your condition.   Will get help right away if you are not doing well or get worse.  Document Released: 06/19/2007 Document Revised: 12/20/2010 Document Reviewed: 06/19/2007 [facility] Patient Information 2012 [place], [place].

## 2011-06-05 NOTE — Progress Notes (Signed)
  Subjective:    Patient ID: Sarah Simon, female    DOB: [birth year], 51 y.o.   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 She presents for bilateral ankle swelling   Onset was 1 month ago. No inciting factors. Has been taking all medicines. No injury. Swelling goes down at night with sleep, but accumulates during the day. It is not painful or uncomfortable. No swelling anywhere else. She stands all the time. No SOB, No DOE. Denies fatigue. No new meds. Does not pee as much with 12.5 mg dose of HCTZ.        Review of Systems  Denies headache, chest pain, shortness of breath, abdominal pain, change in bowel or bladder function, fevers, chills, night sweats, cough. Objective:   Physical Exam  General: Vital signs reviewed and noted. Well-developed, well-nourished, in no acute distress; alert, appropriate and cooperative throughout examination. Head: Normocephalic, atraumatic. Lungs: Normal respiratory effort. Clear to auscultation BL without crackles or wheezes. Heart: RRR. S1 and S2 normal without gallop, murmur, or rubs. Abdomen: BS normoactive. Soft, Nondistended, non-tender. No masses or organomegaly. Extremities: bilateral 2+ pitting edema in the ankles.  Neurologic: A&O X3, CN II - XII are grossly intact. No focal neurologic defecit.   Skin: Grossly sunburnt and not protecting her skin (tan)     Assessment & Plan:  Please see problem oriented charting (best viewed under encounters tab).

## 2011-06-06 DIAGNOSIS — Z85820 Personal history of malignant melanoma of skin: Secondary | ICD-10-CM | POA: Insufficient documentation

## 2011-06-06 LAB — COMPLETE METABOLIC PANEL WITH GFR
ALT: 48 U/L — ABNORMAL HIGH (ref 0–35)
BUN: 9 mg/dL (ref 6–23)
CO2: 27 mEq/L (ref 19–32)
Calcium: 9.3 mg/dL (ref 8.4–10.5)
Chloride: 101 mEq/L (ref 96–112)
Creat: 0.55 mg/dL (ref 0.50–1.10)
GFR, Est African American: >89 mL/min
GFR, Est Non African American: >89 mL/min
Glucose, Bld: 101 mg/dL — ABNORMAL HIGH (ref 70–99)
Total Bilirubin: 0.4 mg/dL (ref 0.3–1.2)

## 2011-06-06 LAB — URINALYSIS, ROUTINE W REFLEX MICROSCOPIC
Glucose, UA: NEGATIVE mg/dL
Hgb urine dipstick: NEGATIVE
Ketones, ur: NEGATIVE mg/dL
Leukocytes, UA: NEGATIVE
Protein, ur: NEGATIVE mg/dL

## 2011-06-06 NOTE — Assessment & Plan Note (Signed)
Blood pressure is increased today.  As she seems to need a bit more volume off I will increase her HCTZ.  Unfortunatley, there is not a combo pill for lisinopril 10 and HCTZ 25. Separate scripts given today.

## 2011-06-06 NOTE — Assessment & Plan Note (Addendum)
She needs to see Dr. Alexandria Lodge ASAP to get back on schedule. INR was okay a couple of days ago, but was 4 earlier this month.   Lab Results  Component Value Date   INR 2.1 06/03/2011   INR 4.4 05/20/2011   INR 2.20 03/25/2011

## 2011-06-06 NOTE — Assessment & Plan Note (Addendum)
Most likely benign cause. Likely due to chronic venous insuff 2/2 chronic clot and factor 5 leiden def. Neck veins are flat and her chest is clear. No signs of heart failure. Albumin was WNL.  Considered getting LE doplars, but she would need to pay out of pocket and this would likely not change our management. Unlikely to be acute clot given that she was recently supra therapeutic on her INR. We will increase her HCTZ to pull off some more fluid and help with BP. I will have her come back in 1 month to re-evaluate her edema. If this is unresolved or unilateral at that time, will try and get dopplars and be less worried about the costs to the patient.

## 2011-06-17 ENCOUNTER — Ambulatory Visit (INDEPENDENT_AMBULATORY_CARE_PROVIDER_SITE_OTHER): Payer: Self-pay | Admitting: Pharmacist

## 2011-06-17 DIAGNOSIS — I2699 Other pulmonary embolism without acute cor pulmonale: Secondary | ICD-10-CM

## 2011-06-17 DIAGNOSIS — I82409 Acute embolism and thrombosis of unspecified deep veins of unspecified lower extremity: Secondary | ICD-10-CM

## 2011-06-17 DIAGNOSIS — Z7901 Long term (current) use of anticoagulants: Secondary | ICD-10-CM

## 2011-06-17 LAB — POCT INR: INR: 4.4

## 2011-06-17 NOTE — Progress Notes (Signed)
Anti-Coagulation Progress Note  Abri A Remigio is a 51 y.o. female who is currently on an anti-coagulation regimen.    RECENT RESULTS: Recent results are below, the most recent result is correlated with a dose of 14 mg. per week: Lab Results  Component Value Date   INR 4.4 06/17/2011   INR 2.1 06/03/2011   INR 4.4 05/20/2011    ANTI-COAG DOSE:   Latest dosing instructions   Total Sun Mon Tue Wed Thu Fri Sat   10 2 mg 2 mg 1 mg 1 mg 1 mg 1 mg 2 mg    (2 mg1) (2 mg1) (2 mg0.5) (2 mg0.5) (2 mg0.5) (2 mg0.5) (2 mg1)         ANTICOAG SUMMARY: Anticoagulation Episode Summary              Current INR goal 2.0-3.0 Next INR check 07/01/2011   INR from last check 4.4! (06/17/2011)     Weekly max dose (mg)  Target end date Indefinite   Indications PULMONARY EMBOLISM, DVT, Long term current use of anticoagulant   [facility] check location [facility]    Send INR reminders to [name]   Comments        Provider Role Specialty Phone number   Burns Spain, MD  Internal Medicine [phone]        ANTICOAG TODAY: Anticoagulation Summary as of 06/17/2011              INR goal 2.0-3.0     Selected INR 4.4! (06/17/2011) Next INR check 07/01/2011   Weekly max dose (mg)  Target end date Indefinite   Indications PULMONARY EMBOLISM, DVT, Long term current use of anticoagulant    Anticoagulation Episode Summary              INR check location [facility] Preferred [facility]    Send INR reminders to [name]   Comments        Provider Role Specialty Phone number   Burns Spain, MD  Internal Medicine [phone]        PATIENT INSTRUCTIONS: Patient Instructions  Patient instructed to take medications as defined in the Anti-coagulation Track section of this encounter.  Patient instructed to OMIT today's dose.  Patient verbalized understanding of these instructions.        FOLLOW-UP Return in 2 weeks (on 07/01/2011) for Follow up INR at  0900h.  Hulen Luster, III Pharm.D., CACP

## 2011-06-17 NOTE — Patient Instructions (Signed)
Patient instructed to take medications as defined in the Anti-coagulation Track section of this encounter.  Patient instructed to OMIT today's dose.  Patient verbalized understanding of these instructions.    

## 2011-07-01 ENCOUNTER — Ambulatory Visit (INDEPENDENT_AMBULATORY_CARE_PROVIDER_SITE_OTHER): Payer: Self-pay | Admitting: Pharmacist

## 2011-07-01 DIAGNOSIS — I2699 Other pulmonary embolism without acute cor pulmonale: Secondary | ICD-10-CM

## 2011-07-01 DIAGNOSIS — I82409 Acute embolism and thrombosis of unspecified deep veins of unspecified lower extremity: Secondary | ICD-10-CM

## 2011-07-01 DIAGNOSIS — Z7901 Long term (current) use of anticoagulants: Secondary | ICD-10-CM

## 2011-07-01 LAB — POCT INR: INR: 1.6

## 2011-07-01 NOTE — Patient Instructions (Signed)
Patient instructed to take medications as defined in the Anti-coagulation Track section of this encounter.  Patient instructed to take today's dose.  Patient verbalized understanding of these instructions.    

## 2011-07-01 NOTE — Progress Notes (Signed)
Anti-Coagulation Progress Note  Sarah Simon is a 51 y.o. female who is currently on an anti-coagulation regimen.    RECENT RESULTS: Recent results are below, the most recent result is correlated with a dose of 10 mg. per week: Lab Results  Component Value Date   INR 1.60 07/01/2011   INR 4.4 06/17/2011   INR 2.1 06/03/2011    ANTI-COAG DOSE:   Latest dosing instructions   Total Sun Mon Tue Wed Thu Fri Sat   12 2 mg 2 mg 2 mg 2 mg 1 mg 1 mg 2 mg    (2 mg1) (2 mg1) (2 mg1) (2 mg1) (2 mg0.5) (2 mg0.5) (2 mg1)         ANTICOAG SUMMARY: Anticoagulation Episode Summary              Current INR goal 2.0-3.0 Next INR check 07/15/2011   INR from last check 1.60! (07/01/2011)     Weekly max dose (mg)  Target end date Indefinite   Indications PULMONARY EMBOLISM, DVT, Long term current use of anticoagulant   INR check location [facility] Pref[facility]    Send INR reminders to [name]   Comments        Provider Role Specialty Phone number   Burns Spain, MD  Internal Medicine [phone]        ANTICOAG TODAY: Anticoagulation Summary as of 07/01/2011              INR goal 2.0-3.0     Selected INR 1.60! (07/01/2011) Next INR check 07/15/2011   Weekly max dose (mg)  Target end date Indefinite   Indications PULMONARY EMBOLISM, DVT, Long term current use of anticoagulant    Anticoagulation Episode Summary              INR check location [facility] Pref[facility]    Send INR reminders to [name]   Comments        Provider Role Specialty Phone number   Burns Spain, MD  Internal Medicine [phone]        PATIENT INSTRUCTIONS: Patient Instructions  Patient instructed to take medications as defined in the Anti-coagulation Track section of this encounter.  Patient instructed to take today's dose.  Patient verbalized understanding of these instructions.        FOLLOW-UP Return in 2 weeks (on 07/15/2011) for Follow up INR at  0930h.  Hulen Luster, III Pharm.D., CACP

## 2011-07-02 DIAGNOSIS — I2699 Other pulmonary embolism without acute cor pulmonale: Secondary | ICD-10-CM

## 2011-07-02 DIAGNOSIS — Z7901 Long term (current) use of anticoagulants: Secondary | ICD-10-CM

## 2011-07-02 DIAGNOSIS — I82409 Acute embolism and thrombosis of unspecified deep veins of unspecified lower extremity: Secondary | ICD-10-CM

## 2011-07-04 ENCOUNTER — Encounter: Payer: Self-pay | Admitting: Internal Medicine

## 2011-07-15 ENCOUNTER — Ambulatory Visit (INDEPENDENT_AMBULATORY_CARE_PROVIDER_SITE_OTHER): Payer: Self-pay | Admitting: Pharmacist

## 2011-07-15 DIAGNOSIS — Z7901 Long term (current) use of anticoagulants: Secondary | ICD-10-CM

## 2011-07-15 DIAGNOSIS — I82409 Acute embolism and thrombosis of unspecified deep veins of unspecified lower extremity: Secondary | ICD-10-CM

## 2011-07-15 DIAGNOSIS — I2699 Other pulmonary embolism without acute cor pulmonale: Secondary | ICD-10-CM

## 2011-07-15 LAB — POCT INR: INR: 2.3

## 2011-07-15 NOTE — Patient Instructions (Signed)
Patient instructed to take medications as defined in the Anti-coagulation Track section of this encounter.  Patient instructed to take today's dose.  Patient verbalized understanding of these instructions.    

## 2011-07-15 NOTE — Progress Notes (Signed)
Anti-Coagulation Progress Note  Sarah Simon is a 51 y.o. female who is currently on an anti-coagulation regimen.    RECENT RESULTS: Recent results are below, the most recent result is correlated with a dose of 12 mg. per week: Lab Results  Component Value Date   INR 2.30 07/15/2011   INR 1.60 07/01/2011   INR 4.4 06/17/2011    ANTI-COAG DOSE:   Latest dosing instructions   Total Sun Mon Tue Wed Thu Fri Sat   13 2 mg 2 mg 2 mg 2 mg 2 mg 1 mg 2 mg    (2 mg1) (2 mg1) (2 mg1) (2 mg1) (2 mg1) (2 mg0.5) (2 mg1)         ANTICOAG SUMMARY: Anticoagulation Episode Summary              Current INR goal 2.0-3.0 Next INR check 08/05/2011   INR from last check 2.30 (07/15/2011)     Weekly max dose (mg)  Target end date Indefinite   Indications PULMONARY EMBOLISM, DVT, Long term current use of anticoagulant   INR check location [facility]    Send INR reminders to [name]   Comments        Provider Role Specialty Phone number   Burns Spain, MD  Internal Medicine [phone]        ANTICOAG TODAY: Anticoagulation Summary as of 07/15/2011              INR goal 2.0-3.0     Selected INR 2.30 (07/15/2011) Next INR check 08/05/2011   Weekly max dose (mg)  Target end date Indefinite   Indications PULMONARY EMBOLISM, DVT, Long term current use of anticoagulant    Anticoagulation Episode Summary              INR check location [facility]    Send INR reminders to [name]   Comments        Provider Role Specialty Phone number   Burns Spain, MD  Internal Medicine [phone]        PATIENT INSTRUCTIONS: Patient Instructions  Patient instructed to take medications as defined in the Anti-coagulation Track section of this encounter.  Patient instructed to take today's dose.  Patient verbalized understanding of these instructions.        FOLLOW-UP Return in 3 weeks (on 08/05/2011) for Follow up INR at  0900h.  Hulen Luster, III Pharm.D., CACP

## 2011-08-05 ENCOUNTER — Ambulatory Visit (INDEPENDENT_AMBULATORY_CARE_PROVIDER_SITE_OTHER): Payer: Self-pay | Admitting: Pharmacist

## 2011-08-05 DIAGNOSIS — I82409 Acute embolism and thrombosis of unspecified deep veins of unspecified lower extremity: Secondary | ICD-10-CM

## 2011-08-05 DIAGNOSIS — I2699 Other pulmonary embolism without acute cor pulmonale: Secondary | ICD-10-CM

## 2011-08-05 DIAGNOSIS — Z7901 Long term (current) use of anticoagulants: Secondary | ICD-10-CM

## 2011-08-05 NOTE — Patient Instructions (Signed)
Patient instructed to take medications as defined in the Anti-coagulation Track section of this encounter.  Patient instructed to OMIT today's dose.  Patient verbalized understanding of these instructions.    

## 2011-08-05 NOTE — Progress Notes (Signed)
Anti-Coagulation Progress Note  Sarah Simon is a 51 y.o. female who is currently on an anti-coagulation regimen.    RECENT RESULTS: Recent results are below, the most recent result is correlated with a dose of 13 mg. per week: Lab Results  Component Value Date   INR 4.10 08/05/2011   INR 2.30 07/15/2011   INR 1.60 07/01/2011    ANTI-COAG DOSE:   Latest dosing instructions   Total Sun Mon Tue Wed Thu Fri Sat   12 2 mg 1 mg 2 mg 2 mg 2 mg 1 mg 2 mg    (2 mg1) (2 mg0.5) (2 mg1) (2 mg1) (2 mg1) (2 mg0.5) (2 mg1)         ANTICOAG SUMMARY: Anticoagulation Episode Summary              Current INR goal 2.0-3.0 Next INR check 09/02/2011   INR from last check 4.10! (08/05/2011)     Weekly max dose (mg)  Target end date Indefinite   Indications PULMONARY EMBOLISM, DVT, Long term current use of anticoagulant   [facility]R check location [facility]    Send INR reminders to [name]   Comments        Provider Role Specialty Phone number   Burns Spain, MD  Internal Medicine [phone]        ANTICOAG TODAY: Anticoagulation Summary as of 08/05/2011              INR goal 2.0-3.0     Selected INR 4.10! (08/05/2011) Next INR check 09/02/2011   Weekly max dose (mg)  Target end date Indefinite   Indications PULMONARY EMBOLISM, DVT, Long term current use of anticoagulant    Anticoagulation Episode Summary              INR check location [facility]    Send INR reminders to [name]   Comments        Provider Role Specialty Phone number   Burns Spain, MD  Internal Medicine [phone]        PATIENT INSTRUCTIONS: Patient Instructions  Patient instructed to take medications as defined in the Anti-coagulation Track section of this encounter.  Patient instructed to OMIT today's dose.  Patient verbalized understanding of these instructions.        FOLLOW-UP Return in 4 weeks (on 09/02/2011) for Follow up INR at  0900h.  Hulen Luster, III Pharm.D., CACP

## 2011-09-02 ENCOUNTER — Ambulatory Visit (INDEPENDENT_AMBULATORY_CARE_PROVIDER_SITE_OTHER): Payer: Self-pay | Admitting: Pharmacist

## 2011-09-02 DIAGNOSIS — I82409 Acute embolism and thrombosis of unspecified deep veins of unspecified lower extremity: Secondary | ICD-10-CM

## 2011-09-02 DIAGNOSIS — I2699 Other pulmonary embolism without acute cor pulmonale: Secondary | ICD-10-CM

## 2011-09-02 DIAGNOSIS — Z7901 Long term (current) use of anticoagulants: Secondary | ICD-10-CM

## 2011-09-02 NOTE — Patient Instructions (Signed)
Patient instructed to take medications as defined in the Anti-coagulation Track section of this encounter.  Patient instructed to take today's dose.  Patient verbalized understanding of these instructions.    

## 2011-09-02 NOTE — Progress Notes (Signed)
Anti-Coagulation Progress Note  Sarah Simon is a 51 y.o. female who is currently on an anti-coagulation regimen.    RECENT RESULTS: Recent results are below, the most recent result is correlated with a dose of 12 mg. per week: Lab Results  Component Value Date   INR 2.70 09/02/2011   INR 4.10 08/05/2011   INR 2.30 07/15/2011    ANTI-COAG DOSE:   Latest dosing instructions   Total Sun Mon Tue Wed Thu Fri Sat   12 2 mg 1 mg 2 mg 2 mg 2 mg 1 mg 2 mg    (2 mg1) (2 mg0.5) (2 mg1) (2 mg1) (2 mg1) (2 mg0.5) (2 mg1)         ANTICOAG SUMMARY: Anticoagulation Episode Summary              Current INR goal 2.0-3.0 Next INR check 09/30/2011   INR from last check 2.70 (09/02/2011)     Weekly max dose (mg)  Target end date Indefinite   Indications PULMONARY EMBOLISM, DVT, Long term current use of anticoagulant   INR check location [facility]    Send INR reminders to [name]   Comments        Provider Role Specialty Phone number   Burns Spain, MD  Internal Medicine [phone]        ANTICOAG TODAY: Anticoagulation Summary as of 09/02/2011              INR goal 2.0-3.0     Selected INR 2.70 (09/02/2011) Next INR check 09/30/2011   Weekly max dose (mg)  Target end date Indefinite   Indications PULMONARY EMBOLISM, DVT, Long term current use of anticoagulant    Anticoagulation Episode Summary              INR check location [facility]    Send INR reminders to [name]   Comments        Provider Role Specialty Phone number   Burns Spain, MD  Internal Medicine [phone]        PATIENT INSTRUCTIONS: Patient Instructions  Patient instructed to take medications as defined in the Anti-coagulation Track section of this encounter.  Patient instructed to take today's dose.  Patient verbalized understanding of these instructions.        FOLLOW-UP Return in 4 weeks (on 09/30/2011) for Follow up INR at  0900.  Hulen Luster, III Pharm.D., CACP

## 2011-09-03 NOTE — Progress Notes (Signed)
Agree 

## 2011-09-30 ENCOUNTER — Ambulatory Visit: Payer: Self-pay

## 2011-10-08 ENCOUNTER — Other Ambulatory Visit: Payer: Self-pay | Admitting: *Deleted

## 2011-10-08 DIAGNOSIS — I10 Essential (primary) hypertension: Secondary | ICD-10-CM

## 2011-10-08 DIAGNOSIS — R600 Localized edema: Secondary | ICD-10-CM

## 2011-10-08 MED ORDER — LISINOPRIL 10 MG PO TABS: 10.0000 mg | ORAL_TABLET | Freq: Every day | ORAL | Status: DC

## 2011-10-08 MED ORDER — HYDROCHLOROTHIAZIDE 25 MG PO TABS: 25.0000 mg | ORAL_TABLET | Freq: Every day | ORAL | Status: DC

## 2011-10-14 ENCOUNTER — Ambulatory Visit (INDEPENDENT_AMBULATORY_CARE_PROVIDER_SITE_OTHER): Payer: Self-pay | Admitting: Pharmacist

## 2011-10-14 DIAGNOSIS — I82409 Acute embolism and thrombosis of unspecified deep veins of unspecified lower extremity: Secondary | ICD-10-CM

## 2011-10-14 DIAGNOSIS — Z7901 Long term (current) use of anticoagulants: Secondary | ICD-10-CM

## 2011-10-14 DIAGNOSIS — I2699 Other pulmonary embolism without acute cor pulmonale: Secondary | ICD-10-CM

## 2011-10-14 NOTE — Patient Instructions (Signed)
Patient instructed to take medications as defined in the Anti-coagulation Track section of this encounter.  Patient instructed to take today's dose.  Patient verbalized understanding of these instructions.    

## 2011-10-14 NOTE — Progress Notes (Signed)
Anti-Coagulation Progress Note  Marycruz A Gingras is a 51 y.o. female who is currently on an anti-coagulation regimen.    RECENT RESULTS: Recent results are below, the most recent result is correlated with a dose of 12 mg. per week: Lab Results  Component Value Date   INR 2.50 10/14/2011   INR 2.70 09/02/2011   INR 4.10 08/05/2011    ANTI-COAG DOSE:   Latest dosing instructions   Total Sun Mon Tue Wed Thu Fri Sat   13 2 mg 2 mg 2 mg 1 mg 2 mg 2 mg 2 mg    (2 mg1) (2 mg1) (2 mg1) (2 mg0.5) (2 mg1) (2 mg1) (2 mg1)         ANTICOAG SUMMARY: Anticoagulation Episode Summary              Current INR goal 2.0-3.0 Next INR check 11/11/2011   INR from last check 2.50 (10/14/2011)     Weekly max dose (mg)  Target end date Indefinite   Indications PULMONARY EMBOLISM, DVT, Long term current use of anticoagulant   INR check location [facility]    Send INR reminders to [name]   Comments        Provider Role Specialty Phone number   Burns Spain, MD  Internal Medicine [phone]        ANTICOAG TODAY: Anticoagulation Summary as of 10/14/2011              INR goal 2.0-3.0     Selected INR 2.50 (10/14/2011) Next INR check 11/11/2011   Weekly max dose (mg)  Target end date Indefinite   Indications PULMONARY EMBOLISM, DVT, Long term current use of anticoagulant    Anticoagulation Episode Summary              INR check location [facility]    Send INR reminders to [name]   Comments        Provider Role Specialty Phone number   Burns Spain, MD  Internal Medicine [phone]        PATIENT INSTRUCTIONS: Patient Instructions  Patient instructed to take medications as defined in the Anti-coagulation Track section of this encounter.  Patient instructed to take today's dose.  Patient verbalized understanding of these instructions.        FOLLOW-UP Return in 4 weeks (on 11/11/2011) for Follow up INR at  0900h.  Hulen Luster, III Pharm.D., CACP

## 2011-10-17 ENCOUNTER — Ambulatory Visit: Payer: Self-pay | Admitting: Internal Medicine

## 2011-10-22 ENCOUNTER — Ambulatory Visit: Payer: Self-pay | Admitting: Internal Medicine

## 2011-11-11 ENCOUNTER — Ambulatory Visit (INDEPENDENT_AMBULATORY_CARE_PROVIDER_SITE_OTHER): Payer: Self-pay | Admitting: Pharmacist

## 2011-11-11 DIAGNOSIS — I82409 Acute embolism and thrombosis of unspecified deep veins of unspecified lower extremity: Secondary | ICD-10-CM

## 2011-11-11 DIAGNOSIS — I2699 Other pulmonary embolism without acute cor pulmonale: Secondary | ICD-10-CM

## 2011-11-11 DIAGNOSIS — Z7901 Long term (current) use of anticoagulants: Secondary | ICD-10-CM

## 2011-11-11 LAB — POCT INR: INR: 2.4

## 2011-11-11 NOTE — Progress Notes (Signed)
Anti-Coagulation Progress Note  Janneth A Broder is a 51 y.o. female who is currently on an anti-coagulation regimen.    RECENT RESULTS: Recent results are below, the most recent result is correlated with a dose of 13 mg. per week: Lab Results  Component Value Date   INR 2.40 11/11/2011   INR 2.50 10/14/2011   INR 2.70 09/02/2011    ANTI-COAG DOSE:   Latest dosing instructions   Total Sun Mon Tue Wed Thu Fri Sat   13 2 mg 2 mg 2 mg 1 mg 2 mg 2 mg 2 mg    (2 mg1) (2 mg1) (2 mg1) (2 mg0.5) (2 mg1) (2 mg1) (2 mg1)         ANTICOAG SUMMARY: Anticoagulation Episode Summary              Current INR goal 2.0-3.0 Next INR check 12/16/2011   INR from last check 2.40 (11/11/2011)     Weekly max dose (mg)  Target end date Indefinite   Indications PULMONARY EMBOLISM, DVT, Long term current use of anticoagulant   [facility] check location [facility]erred lab    Send INR reminders to [name]   Comments        Provider Role Specialty Phone number   Burns Spain, MD  Internal Medicine [phone]        ANTICOAG TODAY: Anticoagulation Summary as of 11/11/2011              INR goal 2.0-3.0     Selected INR 2.40 (11/11/2011) Next INR check 12/16/2011   Weekly max dose (mg)  Target end date Indefinite   Indications PULMONARY EMBOLISM, DVT, Long term current use of anticoagulant    Anticoagulation Episode Summary              IN[facility] check location [facility] Preferred lab    Send INR reminders to [name]   Comments        Provider Role Specialty Phone number   Burns Spain, MD  Internal Medicine [phone]        PATIENT INSTRUCTIONS: Patient Instructions  Patient instructed to take medications as defined in the Anti-coagulation Track section of this encounter.  Patient instructed to take today's dose.  Patient verbalized understanding of these instructions.        FOLLOW-UP Return in 5 weeks (on 12/16/2011) for Follow up INR at  0900h.  Hulen Luster, III Pharm.D., CACP

## 2011-11-11 NOTE — Patient Instructions (Signed)
Patient instructed to take medications as defined in the Anti-coagulation Track section of this encounter.  Patient instructed to take today's dose.  Patient verbalized understanding of these instructions.    

## 2011-11-13 ENCOUNTER — Encounter: Payer: Self-pay | Admitting: Internal Medicine

## 2011-11-13 ENCOUNTER — Ambulatory Visit (INDEPENDENT_AMBULATORY_CARE_PROVIDER_SITE_OTHER): Payer: Self-pay | Admitting: Internal Medicine

## 2011-11-13 VITALS — BP 156/96 | HR 96 | Temp 97.6°F | Ht 68.0 in | Wt 160.9 lb

## 2011-11-13 DIAGNOSIS — R21 Rash and other nonspecific skin eruption: Secondary | ICD-10-CM | POA: Insufficient documentation

## 2011-11-13 DIAGNOSIS — Z23 Encounter for immunization: Secondary | ICD-10-CM

## 2011-11-13 DIAGNOSIS — Z0000 Encounter for general adult medical examination without abnormal findings: Secondary | ICD-10-CM

## 2011-11-13 DIAGNOSIS — Z789 Other specified health status: Secondary | ICD-10-CM

## 2011-11-13 DIAGNOSIS — I10 Essential (primary) hypertension: Secondary | ICD-10-CM

## 2011-11-13 DIAGNOSIS — E785 Hyperlipidemia, unspecified: Secondary | ICD-10-CM

## 2011-11-13 DIAGNOSIS — F411 Generalized anxiety disorder: Secondary | ICD-10-CM

## 2011-11-13 DIAGNOSIS — Z862 Personal history of diseases of the blood and blood-forming organs and certain disorders involving the immune mechanism: Secondary | ICD-10-CM | POA: Insufficient documentation

## 2011-11-13 MED ORDER — LISINOPRIL-HYDROCHLOROTHIAZIDE 20-12.5 MG PO TABS: 1.0000 | ORAL_TABLET | Freq: Every day | ORAL | Status: DC

## 2011-11-13 MED ORDER — CLONAZEPAM 0.5 MG PO TABS: 0.5000 mg | ORAL_TABLET | Freq: Two times a day (BID) | ORAL | Status: DC

## 2011-11-13 NOTE — Assessment & Plan Note (Signed)
The cause of her persistent leukocytosis is unclear. WBC has been above 12k for the last three years. She denies weight loss to suggest cancer. I have ordered a CBC and if it continues to be elevated, we will consider doing a peripheral smear.

## 2011-11-13 NOTE — Assessment & Plan Note (Signed)
Examination of the neck reveals an area of maculopapular rash on the right side, occupying an area of about 6 cm in diameter. There is no swelling or warmth. This rash that is likely localized is possibly secondary to some contact dermatitis. She reports to have allergies to poison ivy, and she's not sure, whether she had contact with it since she has been working out in her garden in the past week. The patient was offered a prescription of Benadryl cream, but she declined since she has some at home. She was advised to use her Benadryl cream to apply in this area 3 times daily for a week.

## 2011-11-13 NOTE — Assessment & Plan Note (Signed)
Sarah Simon continues to use alcohol. She admits to having at least 3 beers per day. She requests for refill for Klonopin which helps out with her anxiety. She is not ready to seek behavoural health evaluation this time. I have refilled her Klonopin.

## 2011-11-13 NOTE — Assessment & Plan Note (Signed)
Previous lipid panel is at goal. I have ordered a recheck today. Will review his results when she comes back for her office appointment in about 2 weeks.

## 2011-11-13 NOTE — Assessment & Plan Note (Signed)
She is offered a pap smear today but she declines because she is her periods. She also defers a breast exam for next visit. She had a normal digital mammogram in 03/2009. She is due for repeat. She has been referred to that. She will be contacted for the test. She will also have a colonoscopy. She declines a flu shot due to fear of allergic reaction as it happened with her son. She however, agrees to have a tetanus shot today.

## 2011-11-13 NOTE — Patient Instructions (Addendum)
Please you will be contacted with instructions on how to get a mammogram and colonoscopy done  Please stop taking Lisinopril  Please stop taking Hydrochlorothiazide Please start taking Lisinopril-Hydrochlorothiazide combination pill We are going to check you blood today and I will contact if the results are abnormal. If normal results, we will go over them during your next visit Please come back to see me in 1 month.

## 2011-11-13 NOTE — Progress Notes (Signed)
Patient ID: Sarah Simon, female   DOB: [birth year], 51 y.o.   MRN: [id number]  Subjective:   Patient ID: Sarah Simon female   DOB: 1960-01-18 51 y.o.   MRN: [id number]  HPI: [name] is a 51 y.o. 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   She presents to the clinic for refill of her medications for hypertension. Her only complaints include a rash on the right side of her neck which has been there for 3 days. She believes that she had contact with something that caused her a reaction to that side of the neck. The rash is itchy, but she denies any history of fevers associated with it.  She reports that she ran out of her medications for the last 1-1/2 months. When she called the clinic yesterday for refill, she was advised to present to the clinic for full clinical evaluation since she was last seen in the clinic 6 months ago.  She otherwise reports to be in good health.    Past Medical History  Diagnosis Date  . Pulmonary embolism     on lifelong coumidin Factor V leiden def  . Factor V Leiden mutation   . Hepatitis C carrier   . Subarachnoid hemorrhage   . Suicide attempt   . Melanoma     unclear diagnosis, this is per pt report, no treatment or follow up known  . Clotting disorder    Current Outpatient Prescriptions  Medication Sig Dispense Refill  . clonazePAM (KLONOPIN) 0.5 MG tablet Take 1 tablet (0.5 mg total) by mouth 2 (two) times daily.  60 tablet  3  . glucosamine-chondroitin 500-400 MG tablet Take 1 tablet by mouth 3 (three) times daily.        Marland Kitchen lisinopril-hydrochlorothiazide (ZESTORETIC) 20-12.5 MG per tablet Take 1 tablet by mouth daily.  30 tablet  1  . warfarin (COUMADIN) 2 MG tablet Take 1 tablet (2 mg total) by mouth daily. TAKE AS DIRECTED BY ANTICOAGULATION CLINIC PROVIDER. One month's supply.  30 tablet  11  . DISCONTD: clonazePAM (KLONOPIN)  0.5 MG tablet Take 1 tablet (0.5 mg total) by mouth 2 (two) times daily.  60 tablet  3   Family History  Problem Relation Age of Onset  . Stroke Mother 11    brain aneurysm  . Heart disease Father 21   History   Social History  . Marital Status: Married    Spouse Name: N/A    Number of Children: N/A  . Years of Education: N/A   Social History Main Topics  . Smoking status: Former Smoker    Types: Cigarettes    Quit date: 04/23/1970  . Smokeless tobacco: None  . Alcohol Use: 24.6 oz/week    21 Cans of beer, 24 Drinks containing 0.5 oz of alcohol per week     Beer occasional  . Drug Use: No  . Sexually Active: Yes     lives with husband and two kids, no job, pays out of pocket, was trying for medicaid   Other Topics Concern  . None   Social History Narrative  . None   Review of Systems: Review of Systems - General ROS: negative for - chills, fatigue, fever, malaise, night sweats or sleep disturbance Psychological ROS: positive for - anxiety, concentration difficulties, memory difficulties and mood swings Respiratory ROS: no cough, shortness of breath, or wheezing Cardiovascular ROS: negative for - chest  pain, dyspnea on exertion, irregular heartbeat, loss of consciousness, murmur, orthopnea, palpitations, rapid heart rate or shortness of breath Gastrointestinal ROS: no abdominal pain, change in bowel habits, or black or bloody stools Neurological ROS: negative for - behavioral changes, bowel and bladder control changes, confusion, dizziness, visual changes or weakness Objective:  Physical Exam: Filed Vitals:   11/13/11 1348  BP: 156/96  Pulse: 96  Temp: 97.6 F (36.4 C)  TempSrc: Oral  Height: 5\' 8"  (1.727 m)  Weight: 160 lb 14.4 oz (72.984 kg)  SpO2: 96%   Physical Exam  Constitutional: She is oriented to person, place, and time. She appears well-developed and well-nourished. No distress.  HENT:  Head: Normocephalic and atraumatic.  Eyes: EOM are normal.  Pupils are equal, round, and reactive to light.  Neck: Normal range of motion. Neck supple. No thyromegaly present.  Cardiovascular: Normal rate, regular rhythm and normal heart sounds.  Exam reveals no gallop and no friction rub.   No murmur heard. Pulmonary/Chest: Effort normal and breath sounds normal. She has no wheezes. She exhibits no tenderness.  Abdominal: Soft. Bowel sounds are normal.  Musculoskeletal: She exhibits no edema and no tenderness.  Neurological: She is alert and oriented to person, place, and time. She has normal reflexes.  Skin: Lesion and rash noted. Rash is maculopapular. No cyanosis.     Psychiatric: She has a normal mood and affect. Thought content normal.   Assessment & Plan:  Assessment and plan of this patient has been discussed with Dr. Kem Kays as detailed in the each problem.

## 2011-11-13 NOTE — Assessment & Plan Note (Addendum)
Sarah Simon reports, that she's been out of medication for at least 6 weeks. Her medications include hydrochlorothiazide 25 mg once daily together with a lisinopril 10 mg once daily. Her blood pressure in the clinic is 156/96 mmHg. She reports that she has challenges affording her medications. I have therefore switched her medications to a combination of lisinopril-hydrochlorothiazide pill of 20/12.5 mg. I believe this will be a better option than having to medications. Her edema that was noted on the office visit 6 months ago has completely resolved. She'll be coming to the clinic in 2 weeks and around that, time, we'll check her basic metabolic panel , specifically to check potassium and creatinine level since she is now taking a higher dose of lisinopril. Creatinine and potassium 6 months ago was normal.

## 2011-11-14 LAB — LIPID PANEL: Total CHOL/HDL Ratio: 6.7 Ratio

## 2011-11-14 LAB — COMPREHENSIVE METABOLIC PANEL
ALT: 55 U/L — ABNORMAL HIGH (ref 0–35)
AST: 56 U/L — ABNORMAL HIGH (ref 0–37)
Albumin: 4.2 g/dL (ref 3.5–5.2)
Alkaline Phosphatase: 70 U/L (ref 39–117)
Glucose, Bld: 110 mg/dL — ABNORMAL HIGH (ref 70–99)
Potassium: 3.5 mEq/L (ref 3.5–5.3)
Sodium: 138 mEq/L (ref 135–145)
Total Bilirubin: 0.4 mg/dL (ref 0.3–1.2)
Total Protein: 7.4 g/dL (ref 6.0–8.3)

## 2011-11-14 NOTE — Addendum Note (Signed)
Addended by: Dow Adolph on: 11/14/2011 03:31 PM   Modules accepted: Orders

## 2011-11-15 LAB — LDL CHOLESTEROL, DIRECT: Direct LDL: 92 mg/dL

## 2011-11-21 NOTE — Progress Notes (Signed)
INTERNAL MEDICINE TEACHING ATTENDING ADDENDUM - Jonah Blue, DO: I personally saw and evaluated Sarah Simon in this clinic visit in conjunction with the resident, Dr. Zada Girt. I have discussed patient's plan of care with medical resident during this visit. I have confirmed the physical exam findings and have read and agree with the clinic note including the plan.

## 2011-11-26 NOTE — Progress Notes (Signed)
I would go ahead and have her come in for it. Thanks.

## 2011-12-09 ENCOUNTER — Ambulatory Visit (INDEPENDENT_AMBULATORY_CARE_PROVIDER_SITE_OTHER): Payer: Self-pay | Admitting: Pharmacist

## 2011-12-09 DIAGNOSIS — I82409 Acute embolism and thrombosis of unspecified deep veins of unspecified lower extremity: Secondary | ICD-10-CM

## 2011-12-09 DIAGNOSIS — I2699 Other pulmonary embolism without acute cor pulmonale: Secondary | ICD-10-CM

## 2011-12-09 DIAGNOSIS — Z7901 Long term (current) use of anticoagulants: Secondary | ICD-10-CM

## 2011-12-09 LAB — POCT INR: INR: 2.7

## 2011-12-09 NOTE — Progress Notes (Signed)
Anti-Coagulation Progress Note  Sarah Simon is a 51 y.o. female who is currently on an anti-coagulation regimen.    RECENT RESULTS: Recent results are below, the most recent result is correlated with a dose of 13 mg. per week: Lab Results  Component Value Date   INR 2.70 12/09/2011   INR 2.40 11/11/2011   INR 2.50 10/14/2011    ANTI-COAG DOSE:   Latest dosing instructions   Total Sun Mon Tue Wed Thu Fri Sat   14 2 mg 2 mg 2 mg 2 mg 2 mg 2 mg 2 mg    (2 mg1) (2 mg1) (2 mg1) (2 mg1) (2 mg1) (2 mg1) (2 mg1)         ANTICOAG SUMMARY: Anticoagulation Episode Summary              Current INR goal 2.0-3.0 Next INR check 01/06/2012   INR from last check 2.70 (12/09/2011)     Weekly max dose (mg)  Target end date Indefinite   Indications PULMONARY EMBOLISM [415.19], DVT (Resolved) [453.40], Long term current use of anticoagulant [V58.61]   [facility] location [facility] Preferred lab    Send INR reminders to [name]   Comments        Provider Role Specialty Phone number   Burns Spain, MD  Internal Medicine [phone]        ANTICOAG TODAY: Anticoagulation Summary as of 12/09/2011              INR goal 2.0-3.0     Selected INR 2.70 (12/09/2011) Next INR check 01/06/2012   Weekly max dose (mg)  Target end date Indefinite   Indications PULMONARY EMBOLISM [415.19], DVT (Resolved) [453.40], Long term current use of anticoagulant [V58.61]    Anticoagulation Episode Summary              [facility] check location [facility] Preferred lab    Send INR reminders to [name]   Comments        Provider Role Specialty Phone number   Burns Spain, MD  Internal Medicine [phone]        PATIENT INSTRUCTIONS: Patient Instructions  Patient instructed to take medications as defined in the Anti-coagulation Track section of this encounter.  Patient instructed to take today's dose.  Patient verbalized understanding of these instructions.          FOLLOW-UP Return in 4 weeks (on 01/06/2012) for Follow up INR at 0930h.  Hulen Luster, III Pharm.D., CACP

## 2011-12-09 NOTE — Patient Instructions (Signed)
Patient instructed to take medications as defined in the Anti-coagulation Track section of this encounter.  Patient instructed to take today's dose.  Patient verbalized understanding of these instructions.    

## 2011-12-16 ENCOUNTER — Ambulatory Visit: Payer: Self-pay

## 2012-01-06 ENCOUNTER — Ambulatory Visit: Payer: Self-pay

## 2012-04-06 ENCOUNTER — Ambulatory Visit (INDEPENDENT_AMBULATORY_CARE_PROVIDER_SITE_OTHER): Payer: Self-pay | Admitting: Internal Medicine

## 2012-04-06 ENCOUNTER — Ambulatory Visit (INDEPENDENT_AMBULATORY_CARE_PROVIDER_SITE_OTHER): Payer: Self-pay | Admitting: Pharmacist

## 2012-04-06 ENCOUNTER — Encounter: Payer: Self-pay | Admitting: Internal Medicine

## 2012-04-06 VITALS — BP 158/93 | HR 71 | Temp 97.4°F | Ht 68.0 in | Wt 161.0 lb

## 2012-04-06 DIAGNOSIS — Z7901 Long term (current) use of anticoagulants: Secondary | ICD-10-CM

## 2012-04-06 DIAGNOSIS — F411 Generalized anxiety disorder: Secondary | ICD-10-CM

## 2012-04-06 DIAGNOSIS — I10 Essential (primary) hypertension: Secondary | ICD-10-CM

## 2012-04-06 DIAGNOSIS — I2699 Other pulmonary embolism without acute cor pulmonale: Secondary | ICD-10-CM

## 2012-04-06 DIAGNOSIS — H8110 Benign paroxysmal vertigo, unspecified ear: Secondary | ICD-10-CM | POA: Insufficient documentation

## 2012-04-06 LAB — POCT INR: INR: 4.2

## 2012-04-06 MED ORDER — CARVEDILOL 3.125 MG PO TABS: 3.1250 mg | ORAL_TABLET | Freq: Two times a day (BID) | ORAL | Status: DC

## 2012-04-06 MED ORDER — CLONAZEPAM 0.5 MG PO TABS: 0.5000 mg | ORAL_TABLET | Freq: Two times a day (BID) | ORAL | Status: DC

## 2012-04-06 MED ORDER — LISINOPRIL 20 MG PO TABS: 20.0000 mg | ORAL_TABLET | Freq: Every day | ORAL | Status: DC

## 2012-04-06 MED ORDER — HYDROCHLOROTHIAZIDE 25 MG PO TABS: 25.0000 mg | ORAL_TABLET | Freq: Every day | ORAL | Status: DC

## 2012-04-06 MED ORDER — HYDROCHLOROTHIAZIDE 12.5 MG PO TABS: 12.5000 mg | ORAL_TABLET | Freq: Every day | ORAL | Status: DC

## 2012-04-06 NOTE — Patient Instructions (Signed)
Patient instructed to take medications as defined in the Anti-coagulation Track section of this encounter.  Patient instructed to OMIT/HOLD today's dose.  Patient verbalized understanding of these instructions.    

## 2012-04-06 NOTE — Progress Notes (Signed)
Subjective:   Patient ID: Sarah Simon female   DOB: 10-12-1960 52 y.o.   MRN: [id number]  HPI: [name] is a 52 y.o. woman who presents to clinic today for follow up from her last appointment.    She states that at her last appointment she was started on lisinopril/HCTZ.  She took it for about a month and then stopped it.  She then had an episode of palpitations and then went back on it but has only taking a half of the tablet.  She denies headaches, chest pain, or changes in her vision.  She has not noticed any other side effects from the medications.    She states that she has worked on cutting down her drinking to 3 beers 4 times weekly.  She also has run out of the klonopin and notes that she has become more anxious.  She states that she wants to be back on that medication.  She denies SI, HI, hopelessness, or worthlessness.  She does have problems sleeping because of racing thoughts.    She also notes that she has had problems with vertigo.  She states that it usually happens with position changes like when she is going to lying down, when she stands up or if she turns her head too fast.  The vertigo goes away after a couple of seconds if she stands or lays still.  She denies fever, chills, tinnitus, or headache.    Past Medical History  Diagnosis Date  . Pulmonary embolism     on lifelong coumidin Factor V leiden def  . Factor V Leiden mutation   . Hepatitis C carrier   . Subarachnoid hemorrhage   . Suicide attempt   . Melanoma     unclear diagnosis, this is per pt report, no treatment or follow up known  . Clotting disorder    Current Outpatient Prescriptions  Medication Sig Dispense Refill  . clonazePAM (KLONOPIN) 0.5 MG tablet Take 1 tablet (0.5 mg total) by mouth 2 (two) times daily.  60 tablet  3  . glucosamine-chondroitin 500-400 MG tablet Take 1 tablet by mouth 3 (three) times daily.        Marland Kitchen lisinopril-hydrochlorothiazide (ZESTORETIC) 20-12.5 MG per  tablet Take 1 tablet by mouth daily.  30 tablet  1  . warfarin (COUMADIN) 2 MG tablet Take 1 tablet (2 mg total) by mouth daily. TAKE AS DIRECTED BY ANTICOAGULATION CLINIC PROVIDER. One month's supply.  30 tablet  11   No current facility-administered medications for this visit.   Family History  Problem Relation Age of Onset  . Stroke Mother 24    brain aneurysm  . Heart disease Father 25   History   Social History  . Marital Status: Married    Spouse Name: N/A    Number of Children: N/A  . Years of Education: N/A   Social History Main Topics  . Smoking status: Former Smoker    Types: Cigarettes    Quit date: 04/23/1970  . Smokeless tobacco: None  . Alcohol Use: 24.6 oz/week    21 Cans of beer, 24 Drinks containing 0.5 oz of alcohol per week     Comment: Beer occasional  . Drug Use: No  . Sexually Active: None     Comment: lives with husband and two kids, no job, pays out of pocket, was trying for medicaid   Other Topics Concern  . None   Social History Narrative  . None   Review of  Systems: A full 12 system ROS is negative except as noted in the HPI and A&P.   Objective:  Physical Exam: Filed Vitals:   04/06/12 1049  BP: 161/100  Pulse: 71  Temp: 97.4 F (36.3 C)  TempSrc: Oral  Height: 5\' 8"  (1.727 m)  Weight: 161 lb (73.029 kg)  SpO2: 97%   Constitutional: Vital signs reviewed.  Patient is a well-developed and well-nourished anxious appearing woman in no acute distress and cooperative with exam. Alert and oriented x3.  Head: Normocephalic and atraumatic Ear: TM normal bilaterally Mouth: no erythema or exudates, MMM Eyes: PERRL, EOMI, conjunctivae normal, No scleral icterus.  Neck: Supple, Trachea midline normal ROM, No JVD, mass, thyromegaly, or carotid bruit present.  Cardiovascular: RRR, S1 normal, S2 normal, no MRG, pulses symmetric and intact bilaterally Pulmonary/Chest: CTAB, no wheezes, rales, or rhonchi Abdominal: Soft. Non-tender,  non-distended, bowel sounds are normal, no masses, organomegaly, or guarding present.  GU: no CVA tenderness Musculoskeletal: No joint deformities, erythema, or stiffness, ROM full and no nontender Hematology: no cervical, inginal, or axillary adenopathy.  Neurological: A&O x3, Strength is normal and symmetric bilaterally, cranial nerve II-XII are grossly intact, no focal motor deficit, sensory intact to light touch bilaterally. Dix-hal pick is negative.  Skin: Warm, dry and intact. No rash, cyanosis, or clubbing.  Psychiatric: Normal mood and affect. speech and behavior is normal. Judgment and thought content normal. Cognition and memory are normal.   Assessment & Plan:

## 2012-04-06 NOTE — Patient Instructions (Addendum)
1.  Stop the Lisinopril/HCTZ combination.  2.  Start HCTZ 12.5 mg tablets.  Take 1 tablet daily for your blood pressure.  3.  Start Lisinopril 20 mg tablets.  Take 1 tablet daily for your blood pressure  4.  Start Coreg 3.125 mg tablets. Take 1 tablet twice daily for your blood pressure and your heart.  5.  Restart the Klonopin 0.5 mg tablets.  Take 1 tablet twice daily as needed for your anxiety.   - Please consider the possibility of counseling to help you with long term control of your anxiety.  - Continue cutting back on your alcohol intake.  You are doing a good job in cutting back.  6.  Follow up in 2 weeks to recheck the blood pressure.

## 2012-04-06 NOTE — Progress Notes (Signed)
Anti-Coagulation Progress Note  Sarah Simon is a 52 y.o. female who is currently on an anti-coagulation regimen.    RECENT RESULTS: Recent results are below, the most recent result is correlated with a dose of 14 mg. per week: Lab Results  Component Value Date   INR 4.20 04/06/2012   INR 2.70 12/09/2011   INR 2.40 11/11/2011    ANTI-COAG DOSE: Anticoagulation Dose Instructions as of 04/06/2012     [name] Tue Wed Thu Fri Sat   New Dose 1 mg 2 mg 2 mg 1 mg 2 mg 2 mg 2 mg       ANTICOAG SUMMARY: Anticoagulation Episode Summary   Current INR goal 2.0-3.0  Next INR check 04/20/2012  INR from last check 4.20! (04/06/2012)  Weekly max dose   Target end date Indefinite  [facility] location [facility]  Preferred lab   Send INR reminders to [name]   Indications  PULMONARY EMBOLISM [415.19] DVT (Resolved) [453.40] Long term current use of anticoagulant [V58.61]        Comments       Anticoagulation Care Providers   Provider Role Specialty Phone number   Burns Spain, MD  Internal Medicine [phone]      ANTICOAG TODAY: Anticoagulation Summary as of 04/06/2012   INR goal 2.0-3.0  Selected INR 4.20! (04/06/2012)  Next INR check 04/20/2012  Target end date Indefinite   Indications  PULMONARY EMBOLISM [415.19] DVT (Resolved) [453.40] Long term current use of anticoagulant [V58.61]      Anticoagulation Episode Summary   [facility] location [facility]   Preferred lab    Send INR reminders to [name]   Comments     Anticoagulation Care Providers   Provider Role Specialty Phone number   Burns Spain, MD  Internal Medicine [phone]      PATIENT INSTRUCTIONS: Patient Instructions  Patient instructed to take medications as defined in the Anti-coagulation Track section of this encounter.  Patient instructed to OMIT/HOLD today's dose.  Patient verbalized understanding of these instructions.       FOLLOW-UP Return in 2 weeks  (on 04/20/2012) for Follow up INR at 0915h.  Hulen Luster, III Pharm.D., CACP

## 2012-04-20 ENCOUNTER — Ambulatory Visit: Payer: Self-pay | Admitting: Internal Medicine

## 2012-04-20 ENCOUNTER — Ambulatory Visit: Payer: Self-pay

## 2012-04-21 ENCOUNTER — Encounter: Payer: Self-pay | Admitting: Internal Medicine

## 2012-04-21 DIAGNOSIS — R269 Unspecified abnormalities of gait and mobility: Secondary | ICD-10-CM | POA: Insufficient documentation

## 2012-04-27 ENCOUNTER — Ambulatory Visit: Payer: Self-pay | Admitting: Radiation Oncology

## 2012-04-27 ENCOUNTER — Ambulatory Visit (INDEPENDENT_AMBULATORY_CARE_PROVIDER_SITE_OTHER): Payer: Self-pay | Admitting: Pharmacist

## 2012-04-27 DIAGNOSIS — I2699 Other pulmonary embolism without acute cor pulmonale: Secondary | ICD-10-CM

## 2012-04-27 DIAGNOSIS — Z7901 Long term (current) use of anticoagulants: Secondary | ICD-10-CM

## 2012-04-27 LAB — POCT INR: INR: 2.6

## 2012-04-27 NOTE — Progress Notes (Signed)
Anti-Coagulation Progress Note  Shaterrica A Cutright is a 52 y.o. female who is currently on an anti-coagulation regimen.    RECENT RESULTS: Recent results are below, the most recent result is correlated with a dose of 12 mg. per week: Lab Results  Component Value Date   INR 2.60 04/27/2012   INR 4.20 04/06/2012   INR 2.70 12/09/2011    ANTI-COAG DOSE: Anticoagulation Dose Instructions as of 04/27/2012     [name] Tue Wed Thu Fri Sat   New Dose 1 mg 2 mg 2 mg 1 mg 2 mg 2 mg 2 mg       ANTICOAG SUMMARY: Anticoagulation Episode Summary   Current INR goal 2.0-3.0  Next INR check 05/25/2012  INR from last check 2.60 (04/27/2012)  Weekly max dose   Target end date Indefinite  [facility] location [facility]  Preferred lab   Send INR reminders to [name]   Indications  PULMONARY EMBOLISM [415.19] DVT (Resolved) [453.40] Long term current use of anticoagulant [V58.61]        Comments       Anticoagulation Care Providers   Provider Role Specialty Phone number   Burns Spain, MD  Internal Medicine [phone]      ANTICOAG TODAY: Anticoagulation Summary as of 04/27/2012   INR goal 2.0-3.0  Selected INR 2.60 (04/27/2012)  Next INR check 05/25/2012  Target end date Indefinite   Indications  PULMONARY EMBOLISM [415.19] DVT (Resolved) [453.40] Long term current use of anticoagulant [V58.61]      Anticoagulation Episode Summary   [facility] location [facility]   Preferred lab    Send INR reminders to [name]   Comments     Anticoagulation Care Providers   Provider Role Specialty Phone number   Burns Spain, MD  Internal Medicine [phone]      PATIENT INSTRUCTIONS: Patient Instructions  Patient instructed to take medications as defined in the Anti-coagulation Track section of this encounter.  Patient instructed to take today's dose.  Patient verbalized understanding of these instructions.       FOLLOW-UP Return in about 4 weeks  (around 05/25/2012) for Follow up INR at 0930h.  Hulen Luster, III Pharm.D., CACP

## 2012-04-27 NOTE — Progress Notes (Signed)
Indication: Prior thromboembolism in setting of reported Factor V Leiden deficiency.  Duration: Lifelong.  INR at target.  Agree with Dr. Saralyn Pilar assessment and plan as documented.

## 2012-04-27 NOTE — Patient Instructions (Signed)
Patient instructed to take medications as defined in the Anti-coagulation Track section of this encounter.  Patient instructed to take today's dose.  Patient verbalized understanding of these instructions.    

## 2012-05-25 ENCOUNTER — Ambulatory Visit (INDEPENDENT_AMBULATORY_CARE_PROVIDER_SITE_OTHER): Payer: Self-pay | Admitting: Pharmacist

## 2012-05-25 DIAGNOSIS — I2699 Other pulmonary embolism without acute cor pulmonale: Secondary | ICD-10-CM

## 2012-05-25 DIAGNOSIS — Z7901 Long term (current) use of anticoagulants: Secondary | ICD-10-CM

## 2012-05-25 LAB — POCT INR: INR: 3.8

## 2012-05-25 NOTE — Progress Notes (Signed)
Anti-Coagulation Progress Note  Sarah Simon is a 52 y.o. female who is currently on an anti-coagulation regimen.    RECENT RESULTS: Recent results are below, the most recent result is correlated with a dose of 12 mg. per week: Lab Results  Component Value Date   INR 3.80 05/25/2012   INR 2.60 04/27/2012   INR 4.20 04/06/2012    ANTI-COAG DOSE: Anticoagulation Dose Instructions as of 05/25/2012     Sarah Simon Tue Wed Thu Fri Sat   New Dose 1 mg 2 mg 1 mg 2 mg 1 mg 2 mg 1 mg       ANTICOAG SUMMARY: Anticoagulation Episode Summary   Current INR goal 2.0-3.0  Next INR check 06/15/2012  INR from last check 3.80! (05/25/2012)  Weekly max dose   Target end date Indefinite  [facility] location [facility]  Preferred lab   Send INR reminders to [name]   Indications  PULMONARY EMBOLISM [415.19] DVT (Resolved) [453.40] Long term current use of anticoagulant [V58.61]        Comments       Anticoagulation Care Providers   Provider Role Specialty Phone number   Burns Spain, MD  Internal Medicine [phone]      ANTICOAG TODAY: Anticoagulation Summary as of 05/25/2012   INR goal 2.0-3.0  Selected INR 3.80! (05/25/2012)  Next INR check 06/15/2012  Target end date Indefinite   Indications  PULMONARY EMBOLISM [415.19] DVT (Resolved) [453.40] Long term current use of anticoagulant [V58.61]      Anticoagulation Episode Summary   [facility] location [facility]   Preferred lab    Send INR reminders to [name]   Comments     Anticoagulation Care Providers   Provider Role Specialty Phone number   Burns Spain, MD  Internal Medicine [phone]      PATIENT INSTRUCTIONS: Patient Instructions  Patient instructed to take medications as defined in the Anti-coagulation Track section of this encounter.  Patient instructed to take today's dose.  Patient verbalized understanding of these instructions.       FOLLOW-UP Return in 3 weeks (on  06/15/2012) for Follow up INR at 1015h.  Hulen Luster, III Pharm.D., CACP

## 2012-05-25 NOTE — Progress Notes (Signed)
Indication: Prior thromboembolism in setting of reported Factor V Leiden deficiency. Duration: Lifelong. INR above target. Agree with Dr. Saralyn Pilar assessment and plan as documented.

## 2012-05-25 NOTE — Patient Instructions (Signed)
Patient instructed to take medications as defined in the Anti-coagulation Track section of this encounter.  Patient instructed to take today's dose.  Patient verbalized understanding of these instructions.    

## 2012-06-15 ENCOUNTER — Ambulatory Visit (INDEPENDENT_AMBULATORY_CARE_PROVIDER_SITE_OTHER): Payer: Self-pay | Admitting: Pharmacist

## 2012-06-15 DIAGNOSIS — Z7901 Long term (current) use of anticoagulants: Secondary | ICD-10-CM

## 2012-06-15 DIAGNOSIS — I2699 Other pulmonary embolism without acute cor pulmonale: Secondary | ICD-10-CM

## 2012-06-15 NOTE — Progress Notes (Signed)
Anti-Coagulation Progress Note  Nira A Rudell is a 52 y.o. female who is currently on an anti-coagulation regimen.    RECENT RESULTS: Recent results are below, the most recent result is correlated with a dose of 10 mg. per week: Lab Results  Component Value Date   INR 2.20 06/15/2012   INR 3.80 05/25/2012   INR 2.60 04/27/2012    ANTI-COAG DOSE: Anticoagulation Dose Instructions as of 06/15/2012     [name] Tue Wed Thu Fri Sat   New Dose 1 mg 2 mg 1 mg 2 mg 1 mg 2 mg 1 mg       ANTICOAG SUMMARY: Anticoagulation Episode Summary   Current INR goal 2.0-3.0  Next INR check 07/13/2012  INR from last check 2.20 (06/15/2012)  Weekly max dose   Target end date Indefinite  [facility]  Preferred lab   Send INR reminders to [name]   Indications  PULMONARY EMBOLISM [415.19] DVT (Resolved) [453.40] Long term current use of anticoagulant [V58.61]        Comments       Anticoagulation Care Providers   Provider Role Specialty Phone number   Burns Spain, MD  Internal Medicine [phone]      ANTICOAG TODAY: Anticoagulation Summary as of 06/15/2012   INR goal 2.0-3.0  Selected INR 2.20 (06/15/2012)  Next INR check 07/13/2012  Target end date Indefinite   Indications  PULMONARY EMBOLISM [415.19] DVT (Resolved) [453.40] Long term current use of anticoagulant [V58.61]      Anticoagulation Episode Summary   [facility]   Preferred lab    Send INR reminders to [name]   Comments     Anticoagulation Care Providers   Provider Role Specialty Phone number   Burns Spain, MD  Internal Medicine [phone]      PATIENT INSTRUCTIONS: Patient Instructions  Patient instructed to take medications as defined in the Anti-coagulation Track section of this encounter.  Patient instructed to take today's dose.  Patient verbalized understanding of these instructions.       FOLLOW-UP Return in 4 weeks (on  07/13/2012) for Follow up INR at 1045h.  Hulen Luster, III Pharm.D., CACP

## 2012-06-15 NOTE — Patient Instructions (Signed)
Patient instructed to take medications as defined in the Anti-coagulation Track section of this encounter.  Patient instructed to take today's dose.  Patient verbalized understanding of these instructions.    

## 2012-07-03 ENCOUNTER — Other Ambulatory Visit: Payer: Self-pay | Admitting: Internal Medicine

## 2012-07-09 NOTE — Assessment & Plan Note (Signed)
She has only been taking half of her lisinopril/hctz daily.  She states that she has been taking her other medications including coreg as directed.    BP Readings from Last 3 Encounters:  04/06/12 158/93  11/13/11 156/96  06/05/11 147/84   BP is elevated above her goal of <140/90.  We will increase her back to a whole tablet daily and follow up in a few weeks.

## 2012-07-09 NOTE — Assessment & Plan Note (Signed)
She is anxious appearing today in the clinic and has been off her Klonopin for sometime.  We discussed the long term treatment of her anxiety.  She has not been established with a psychiatrist.  We will restart her klonopin today until she can see her PCP and he can decide on long term use of this medication.  She was encouraged to continue working to quit drinking alcohol with this medication.

## 2012-07-09 NOTE — Assessment & Plan Note (Signed)
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  We discussed the natural course of the disease and the harmless nature.  She was cautioned to be careful when she moves.

## 2012-07-13 ENCOUNTER — Ambulatory Visit: Payer: Self-pay

## 2012-07-15 ENCOUNTER — Ambulatory Visit (INDEPENDENT_AMBULATORY_CARE_PROVIDER_SITE_OTHER): Payer: Self-pay | Admitting: Internal Medicine

## 2012-07-15 ENCOUNTER — Encounter: Payer: Self-pay | Admitting: Internal Medicine

## 2012-07-15 VITALS — BP 122/78 | HR 70 | Temp 97.3°F | Ht 68.0 in | Wt 155.8 lb

## 2012-07-15 DIAGNOSIS — I10 Essential (primary) hypertension: Secondary | ICD-10-CM

## 2012-07-15 DIAGNOSIS — Z7901 Long term (current) use of anticoagulants: Secondary | ICD-10-CM

## 2012-07-15 LAB — BASIC METABOLIC PANEL WITH GFR
CO2: 26 mEq/L (ref 19–32)
Calcium: 9.3 mg/dL (ref 8.4–10.5)
Chloride: 102 mEq/L (ref 96–112)
Creat: 0.68 mg/dL (ref 0.50–1.10)
GFR, Est African American: >89 mL/min
Glucose, Bld: 143 mg/dL — ABNORMAL HIGH (ref 70–99)
Sodium: 138 mEq/L (ref 135–145)

## 2012-07-15 MED ORDER — HYDROCHLOROTHIAZIDE 12.5 MG PO TABS: 12.5000 mg | ORAL_TABLET | Freq: Every day | ORAL | Status: DC

## 2012-07-15 MED ORDER — LISINOPRIL 20 MG PO TABS: 20.0000 mg | ORAL_TABLET | Freq: Every day | ORAL | Status: DC

## 2012-07-15 NOTE — Patient Instructions (Addendum)
1. Please see Dr. Alexandria Lodge on Monday on 07/20/12 2. Will give your refill for HCTZ and Lisinopril. 3. Follow up with your primary care physician Dr. Zada Girt in 1-3 months.

## 2012-07-15 NOTE — Progress Notes (Signed)
Subjective:   Patient ID: Sarah Simon female   DOB: April 12, 1960 52 y.o.   MRN: [id number] Chief complaints: need warfarin monitoring and HTN follow up.  HPI: Sarah Simon is a 52 y.o. 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  Patient states that she is doing well. No complaints. She is here for INR checks since she missed her sppt with Dr. Alexandria Lodge on Monday 07/13/12.   Health maintenance--will be on hold due to lack of insurance at present.  - Mammogram - colonoscopy   Past Medical History  Diagnosis Date  . Pulmonary embolism May 2003     On Lifelong coumidin Factor V leiden def  . Factor V Leiden mutation   . Hepatitis C carrier   . Subarachnoid hemorrhage     Required surgery for clipping   . Suicide attempt   . Melanoma     unclear diagnosis, this is per pt report, no treatment or follow up known   Current Outpatient Prescriptions  Medication Sig Dispense Refill  . carvedilol (COREG) 3.125 MG tablet Take 1 tablet (3.125 mg total) by mouth 2 (two) times daily with a meal.  60 tablet  3  . clonazePAM (KLONOPIN) 0.5 MG tablet Take 1 tablet (0.5 mg total) by mouth 2 (two) times daily.  60 tablet  3  . glucosamine-chondroitin 500-400 MG tablet Take 1 tablet by mouth 3 (three) times daily.        . hydrochlorothiazide (HYDRODIURIL) 12.5 MG tablet Take 1 tablet (12.5 mg total) by mouth daily.  30 tablet  3  . lisinopril (PRINIVIL,ZESTRIL) 20 MG tablet Take 1 tablet (20 mg total) by mouth daily.  30 tablet  3  . warfarin (COUMADIN) 2 MG tablet TAKE ONE TABLET BY MOUTH EVERY DAY OR TAKE AS DIRECTED BY ANTICOAGULATION CLINIC.  30 tablet  0   No current facility-administered medications for this visit.   Family History  Problem Relation Age of Onset  . Stroke Mother 38    brain aneurysm  . Heart disease Father 70   History    Social History  . Marital Status: Married    Spouse Name: N/A    Number of Children: N/A  . Years of Education: N/A   Social History Main Topics  . Smoking status: Former Smoker    Types: Cigarettes    Quit date: 04/23/1970  . Smokeless tobacco: None  . Alcohol Use: 24.6 oz/week    21 Cans of beer, 24 Drinks containing 0.5 oz of alcohol per week     Comment: Beer occasional  . Drug Use: No  . Sexually Active: None     Comment: lives with husband and two kids, no job, pays out of pocket, was trying for medicaid   Other Topics Concern  . None   Social History Narrative  . None   Review of Systems: Review of Systems:  Constitutional:  Denies fever, chills, diaphoresis, appetite change and fatigue.   HEENT:  Denies congestion, sore throat, rhinorrhea, sneezing, mouth sores, trouble swallowing, neck pain   Respiratory:  Denies SOB, DOE, cough, and wheezing.   Cardiovascular:  Denies palpitations and leg swelling.   Gastrointestinal:  Denies nausea, vomiting, abdominal pain, diarrhea, constipation, blood in stool and abdominal distention.   Genitourinary:  Denies dysuria, urgency, frequency, hematuria, flank pain and difficulty urinating.   Musculoskeletal:  Denies myalgias, back  pain, joint swelling, arthralgias and gait problem.   Skin:  Denies pallor, rash and wound.   Neurological:  Denies dizziness, seizures, syncope, weakness, light-headedness, numbness and headaches.    .    Objective:  Physical Exam: Filed Vitals:   07/15/12 1004  BP: 122/78  Pulse: 70  Temp: 97.3 F (36.3 C)  TempSrc: Oral  Height: 5\' 8"  (1.727 m)  Weight: 155 lb 12.8 oz (70.67 kg)  SpO2: 96%   General: alert, well-developed, and cooperative to examination.  HEENT: PERRLA. Neck: supple, full ROM, no thyromegaly.  Lungs: CTA B/L Heart: normal rate, regular rhythm, no murmur, no gallop, and no rub.  Abdomen: soft, non-tender, normal bowel sounds, no distention, no guarding, no rebound  tenderness. Msk: no joint swelling, no joint warmth, and no redness over joints.  Pulses: 2+ DP/PT pulses bilaterally Extremities: No cyanosis, clubbing, edema Neurologic: alert & oriented X3.     Assessment & Plan:

## 2012-07-19 NOTE — Assessment & Plan Note (Signed)
BP Readings from Last 3 Encounters:  07/15/12 122/78  04/06/12 158/93  11/13/11 156/96    Lab Results  Component Value Date   Sarah Simon 138 07/15/2012   K 3.5 07/15/2012   CREATININE 0.68 07/15/2012    Assessment: Blood pressure control: controlled Progress toward BP goal:  at goal Comments:   Plan: Medications:  continue current medications Educational resources provided: brochure;handout;video Self management tools provided:   Other plans: continue current therapy.

## 2012-07-19 NOTE — Assessment & Plan Note (Signed)
Patient doing well. Reports medical compliance. She is here for INR check.

## 2012-07-20 ENCOUNTER — Ambulatory Visit: Payer: Self-pay

## 2012-07-20 NOTE — Progress Notes (Signed)
Case discussed with Dr. Li soon after the resident saw the patient. We reviewed the resident's history and exam and pertinent patient test results. I agree with the assessment, diagnosis, and plan of care documented in the resident's note. 

## 2012-08-01 ENCOUNTER — Other Ambulatory Visit: Payer: Self-pay | Admitting: Internal Medicine

## 2012-08-03 ENCOUNTER — Telehealth: Payer: Self-pay | Admitting: *Deleted

## 2012-08-03 NOTE — Telephone Encounter (Signed)
Rx called in to pharmacy. 

## 2012-08-17 ENCOUNTER — Ambulatory Visit: Payer: Self-pay | Admitting: Internal Medicine

## 2012-08-21 ENCOUNTER — Encounter: Payer: Self-pay | Admitting: Internal Medicine

## 2012-09-07 ENCOUNTER — Telehealth: Payer: Self-pay | Admitting: *Deleted

## 2012-09-07 NOTE — Telephone Encounter (Signed)
HCTZ 12.5 daily was started by Dr. Tonny Branch in March 2014. Patient saw me in July and requested medication refill. I give her 5 months refills. I just checked [facility] $4 med list and HCTZ 12.5 mg po daily is on the list. Please call [facility] to verified it. Thank you   Dr. Dierdre Searles

## 2012-09-07 NOTE — Telephone Encounter (Signed)
Pt upset - uses [facility] - recently filled HCTZ cost was $40.00. Taking half the amount - was on HCTZ 25mg  and was only $4.00. Pt can not afford HCTZ12.5mg .l Garett Tetzloff RN 09/07/12 4PM

## 2012-09-08 MED ORDER — HYDROCHLOROTHIAZIDE 12.5 MG PO CAPS: 12.5000 mg | ORAL_CAPSULE | Freq: Every day | ORAL | Status: DC

## 2012-10-19 ENCOUNTER — Ambulatory Visit (INDEPENDENT_AMBULATORY_CARE_PROVIDER_SITE_OTHER): Payer: Self-pay | Admitting: Pharmacist

## 2012-10-19 DIAGNOSIS — Z7901 Long term (current) use of anticoagulants: Secondary | ICD-10-CM

## 2012-10-19 DIAGNOSIS — I2699 Other pulmonary embolism without acute cor pulmonale: Secondary | ICD-10-CM

## 2012-10-19 NOTE — Progress Notes (Signed)
Anti-Coagulation Progress Note  Sarah Simon is a 52 y.o. female who is currently on an anti-coagulation regimen.    RECENT RESULTS: Recent results are below, the most recent result is correlated with a dose of 10 mg. per week: Lab Results  Component Value Date   INR 1.90 10/19/2012   INR 2.3 07/15/2012   INR 2.20 06/15/2012    ANTI-COAG DOSE: Anticoagulation Dose Instructions as of 10/19/2012     [name] Tue Wed Thu Fri Sat   New Dose 2 mg 1 mg 2 mg 1 mg 2 mg 1 mg 2 mg       ANTICOAG SUMMARY: Anticoagulation Episode Summary   Current INR goal 2.0-3.0  Next INR check 11/16/2012  INR from last check 1.90! (10/19/2012)  Weekly max dose   Target end date Indefinite  [facility]  Preferred lab   Send INR reminders to [name]   Indications  PULMONARY EMBOLISM (Resolved) [415.19] DVT (Resolved) [453.40] Lifelong Coumadin therapy [V58.61]        Comments       Anticoagulation Care Providers   Provider Role Specialty Phone number   Burns Spain, MD  Internal Medicine [phone]      ANTICOAG TODAY: Anticoagulation Summary as of 10/19/2012   INR goal 2.0-3.0  Selected INR 1.90! (10/19/2012)  Next INR check 11/16/2012  Target end date Indefinite   Indications  PULMONARY EMBOLISM (Resolved) [415.19] DVT (Resolved) [453.40] Lifelong Coumadin therapy [V58.61]      Anticoagulation Episode Summary   [facility]   Preferred lab    Send INR reminders to [name]   Comments     Anticoagulation Care Providers   Provider Role Specialty Phone number   Burns Spain, MD  Internal Medicine [phone]      PATIENT INSTRUCTIONS: Patient Instructions  Patient instructed to take medications as defined in the Anti-coagulation Track section of this encounter.  Patient instructed to take today's dose.  Patient verbalized understanding of these instructions.       FOLLOW-UP Return in 4 weeks (on  11/16/2012).  Hulen Luster, III Pharm.D., CACP

## 2012-10-19 NOTE — Patient Instructions (Signed)
Patient instructed to take medications as defined in the Anti-coagulation Track section of this encounter.  Patient instructed to take today's dose.  Patient verbalized understanding of these instructions.    

## 2012-10-26 ENCOUNTER — Other Ambulatory Visit: Payer: Self-pay | Admitting: Internal Medicine

## 2012-11-10 ENCOUNTER — Encounter: Payer: Self-pay | Admitting: Internal Medicine

## 2012-11-10 NOTE — Telephone Encounter (Signed)
Chart opened in error

## 2012-11-16 ENCOUNTER — Ambulatory Visit (INDEPENDENT_AMBULATORY_CARE_PROVIDER_SITE_OTHER): Payer: Self-pay | Admitting: Pharmacist

## 2012-11-16 DIAGNOSIS — Z7901 Long term (current) use of anticoagulants: Secondary | ICD-10-CM

## 2012-11-16 DIAGNOSIS — I2699 Other pulmonary embolism without acute cor pulmonale: Secondary | ICD-10-CM

## 2012-11-16 LAB — POCT INR: INR: 3.5

## 2012-11-16 MED ORDER — WARFARIN SODIUM 2 MG PO TABS: ORAL_TABLET | ORAL | Status: DC

## 2012-11-16 NOTE — Patient Instructions (Signed)
Patient instructed to take medications as defined in the Anti-coagulation Track section of this encounter.  Patient instructed to take today's dose.  Patient verbalized understanding of these instructions.    

## 2012-11-16 NOTE — Progress Notes (Signed)
Anti-Coagulation Progress Note  Sarah Simon is a 52 y.o. female who is currently on an anti-coagulation regimen.    RECENT RESULTS: Recent results are below, the most recent result is correlated with a dose of 11 mg. per week: Lab Results  Component Value Date   INR 3.50 11/16/2012   INR 1.90 10/19/2012   INR 2.3 07/15/2012    ANTI-COAG DOSE: Anticoagulation Dose Instructions as of 11/16/2012     [name] Tue Wed Thu Fri Sat   New Dose 2 mg 1 mg 1 mg 1 mg 2 mg 1 mg 2 mg       ANTICOAG SUMMARY: Anticoagulation Episode Summary   Current INR goal 2.0-3.0  Next INR check 11/30/2012  INR from last check 3.50! (11/16/2012)  Weekly max dose   Target end date Indefinite  [facility]  Preferred lab   Send INR reminders to [name]   Indications  PULMONARY EMBOLISM (Resolved) [415.19] DVT (Resolved) [453.40] Lifelong Coumadin therapy [V58.61]        Comments       Anticoagulation Care Providers   Provider Role Specialty Phone number   Burns Spain, MD  Internal Medicine [phone]      ANTICOAG TODAY: Anticoagulation Summary as of 11/16/2012   INR goal 2.0-3.0  Selected INR 3.50! (11/16/2012)  Next INR check 11/30/2012  Target end date Indefinite   Indications  PULMONARY EMBOLISM (Resolved) [415.19] DVT (Resolved) [453.40] Lifelong Coumadin therapy [V58.61]      Anticoagulation Episode Summary   [facility]   Preferred lab    Send INR reminders to [name]   Comments     Anticoagulation Care Providers   Provider Role Specialty Phone number   Burns Spain, MD  Internal Medicine [phone]      PATIENT INSTRUCTIONS: Patient Instructions  Patient instructed to take medications as defined in the Anti-coagulation Track section of this encounter.  Patient instructed to take today's dose.  Patient verbalized understanding of these instructions.       FOLLOW-UP Return in 2 weeks (on  11/30/2012) for Follow up INR at 0945h.  Hulen Luster, III Pharm.D., CACP

## 2012-11-30 ENCOUNTER — Ambulatory Visit (INDEPENDENT_AMBULATORY_CARE_PROVIDER_SITE_OTHER): Payer: Self-pay | Admitting: Pharmacist

## 2012-11-30 DIAGNOSIS — I2699 Other pulmonary embolism without acute cor pulmonale: Secondary | ICD-10-CM

## 2012-11-30 DIAGNOSIS — Z7901 Long term (current) use of anticoagulants: Secondary | ICD-10-CM

## 2012-11-30 LAB — POCT INR: INR: 2.7

## 2012-11-30 MED ORDER — WARFARIN SODIUM 2 MG PO TABS: ORAL_TABLET | ORAL | Status: DC

## 2012-11-30 NOTE — Progress Notes (Signed)
Anti-Coagulation Progress Note  Uchechi A Whitsel is a 52 y.o. female who is currently on an anti-coagulation regimen.    RECENT RESULTS: Recent results are below, the most recent result is correlated with a dose of 10 mg. per week: Lab Results  Component Value Date   INR 2.70 11/30/2012   INR 3.50 11/16/2012   INR 1.90 10/19/2012    ANTI-COAG DOSE: Anticoagulation Dose Instructions as of 11/30/2012     [name] Tue Wed Thu Fri Sat   New Dose 2 mg 1 mg 1 mg 1 mg 2 mg 1 mg 2 mg       ANTICOAG SUMMARY: Anticoagulation Episode Summary   Current INR goal 2.0-3.0  Next INR check 01/04/2013  INR from last check 2.70 (11/30/2012)  Weekly max dose   Target end date Indefinite  [facility]  Preferred lab   Send INR reminders to [name]   Indications  PULMONARY EMBOLISM (Resolved) [415.19] DVT (Resolved) [453.40] Lifelong Coumadin therapy [V58.61]        Comments       Anticoagulation Care Providers   Provider Role Specialty Phone number   Burns Spain, MD  Internal Medicine [phone]      ANTICOAG TODAY: Anticoagulation Summary as of 11/30/2012   INR goal 2.0-3.0  Selected INR 2.70 (11/30/2012)  Next INR check 01/04/2013  Target end date Indefinite   Indications  PULMONARY EMBOLISM (Resolved) [415.19] DVT (Resolved) [453.40] Lifelong Coumadin therapy [V58.61]      Anticoagulation Episode Summary   [facility]   Preferred lab    Send INR reminders to [name]   Comments     Anticoagulation Care Providers   Provider Role Specialty Phone number   Burns Spain, MD  Internal Medicine [phone]      PATIENT INSTRUCTIONS: Patient Instructions  Patient instructed to take medications as defined in the Anti-coagulation Track section of this encounter.  Patient instructed to take today's dose.  Patient verbalized understanding of these instructions.       FOLLOW-UP Return in 5 weeks (on  01/04/2013) for Follow up INR at 1000h.  Hulen Luster, III Pharm.D., CACP

## 2012-11-30 NOTE — Patient Instructions (Signed)
Patient instructed to take medications as defined in the Anti-coagulation Track section of this encounter.  Patient instructed to take today's dose.  Patient verbalized understanding of these instructions.    

## 2012-12-16 ENCOUNTER — Encounter: Payer: Self-pay | Admitting: Internal Medicine

## 2012-12-16 ENCOUNTER — Ambulatory Visit (INDEPENDENT_AMBULATORY_CARE_PROVIDER_SITE_OTHER): Payer: Self-pay | Admitting: Internal Medicine

## 2012-12-16 VITALS — BP 133/89 | HR 73 | Temp 98.2°F | Ht 68.0 in | Wt 153.2 lb

## 2012-12-16 DIAGNOSIS — R7402 Elevation of levels of lactic acid dehydrogenase (LDH): Secondary | ICD-10-CM

## 2012-12-16 DIAGNOSIS — E785 Hyperlipidemia, unspecified: Secondary | ICD-10-CM

## 2012-12-16 DIAGNOSIS — B182 Chronic viral hepatitis C: Secondary | ICD-10-CM

## 2012-12-16 DIAGNOSIS — Z0000 Encounter for general adult medical examination without abnormal findings: Secondary | ICD-10-CM | POA: Insufficient documentation

## 2012-12-16 DIAGNOSIS — F411 Generalized anxiety disorder: Secondary | ICD-10-CM

## 2012-12-16 DIAGNOSIS — Z862 Personal history of diseases of the blood and blood-forming organs and certain disorders involving the immune mechanism: Secondary | ICD-10-CM

## 2012-12-16 DIAGNOSIS — I10 Essential (primary) hypertension: Secondary | ICD-10-CM

## 2012-12-16 DIAGNOSIS — F1010 Alcohol abuse, uncomplicated: Secondary | ICD-10-CM

## 2012-12-16 LAB — COMPLETE METABOLIC PANEL WITH GFR
ALT: 52 U/L — ABNORMAL HIGH (ref 0–35)
AST: 50 U/L — ABNORMAL HIGH (ref 0–37)
Alkaline Phosphatase: 76 U/L (ref 39–117)
Creat: 0.62 mg/dL (ref 0.50–1.10)
Glucose, Bld: 105 mg/dL — ABNORMAL HIGH (ref 70–99)
Potassium: 4 mEq/L (ref 3.5–5.3)
Sodium: 140 mEq/L (ref 135–145)
Total Bilirubin: 0.4 mg/dL (ref 0.3–1.2)
Total Protein: 7.8 g/dL (ref 6.0–8.3)

## 2012-12-16 LAB — LIPID PANEL
HDL: 42 mg/dL (ref >39)
Total CHOL/HDL Ratio: 6.2 Ratio
Triglycerides: 590 mg/dL — ABNORMAL HIGH (ref <150)

## 2012-12-16 MED ORDER — HYDROCHLOROTHIAZIDE 12.5 MG PO CAPS: 12.5000 mg | ORAL_CAPSULE | Freq: Every day | ORAL | Status: DC

## 2012-12-16 MED ORDER — LISINOPRIL 20 MG PO TABS: 20.0000 mg | ORAL_TABLET | Freq: Every day | ORAL | Status: DC

## 2012-12-16 MED ORDER — CLONAZEPAM 0.5 MG PO TABS: 0.5000 mg | ORAL_TABLET | Freq: Two times a day (BID) | ORAL | Status: DC | PRN

## 2012-12-16 NOTE — Patient Instructions (Addendum)
General Instructions: Please take your medications as before  I have referred you to hepatitis clinic  You may take Advil for your knee pains  Please be sure to do a mammogram  During your next visit, we can do a PAP smear Please follow up in 3 months   Treatment Goals:  Goals (1 Years of Data) as of 12/16/12         As of Today 07/15/12 04/06/12 04/06/12 11/13/11     Blood Pressure    . Blood Pressure < 130/80  133/89 122/78 158/93 161/100 156/96     Diet    . Cut out extra servings           Lifestyle    . Prevent Falls            Progress Toward Treatment Goals:  Treatment Goal 12/16/2012  Blood pressure at goal    Self Care Goals & Plans:  Self Care Goal 12/16/2012  Manage my medications take my medicines as prescribed; bring my medications to every visit; refill my medications on time; follow the sick day instructions if I am sick  Monitor my health keep track of my blood glucose; bring my glucose meter and log to each visit; keep track of my blood pressure  Eat healthy foods drink diet soda or water instead of juice or soda; eat more vegetables  Be physically active -  Meeting treatment goals maintain the current self-care plan    No flowsheet data found.   Care Management & Community Referrals:  Referral 07/15/2012  Referrals made for care management support none needed

## 2012-12-16 NOTE — Assessment & Plan Note (Signed)
Sarah Simon continues to report symptoms of anxiety requiring Klonopin twice daily when necessary. She reports that recently her daughter moved in with her which has been making her very anxious. She denies depressive symptoms. She requests refills of chronic pain.  Plan -Refill Klonopin.

## 2012-12-16 NOTE — Assessment & Plan Note (Signed)
Patient has never been treated for hepatitis C. No apparent complications like acute or chronic liver failure.  Plan  - I emphasized to the patient to avoid hepatotoxic medications and alcohol. Patient verbalized understanding.  - Will refer patient to hepatitis C clinic for evaluation and treatment. - check live function  - do U/S scan for cirrhosis once she can afford it.

## 2012-12-16 NOTE — Assessment & Plan Note (Addendum)
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    Plan: 1. Labs/imaging: abdominal CT scan in 2003 -liver normal. Patient cannot afford abdomen ultrasound scan to evaluate for possibility of liver cirrhosis. CMP today 2. Therapy: Counsel the patient about avoidance of Tylenol, alcohol, and other hepatotoxic medications. 3. Follow up: Follow up in 3 months

## 2012-12-16 NOTE — Assessment & Plan Note (Signed)
The etiology of her leukocytosis is not very apparent. She denies any symptoms of weight loss, or bleeding problems. Of note, patient has history of splenectomy at age 51 following a blunt abdominal trauma.  Plan. - Repeat CBC. - Technical peripheral smear. - Will consider referral to hematology if leukocytosis persists. - I do not suspect malignancy at this time.

## 2012-12-16 NOTE — Progress Notes (Signed)
Patient ID: Sarah Simon, female   DOB: 1960-08-06, 52 y.o.   MRN: [id number]   Subjective:   HPI: [name] is a 52 y.o.  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   She presents to the clinic for routine followup visit. She has no complaints today except for a knee pain, which is chronic. She tries to take Advil one pill twice daily, when necessary. She's compliant with all her medications for HTN. She request for refills. Patient does not have insurance. She recently started working in [employer] alongside her husband, who accompanies her to the clinic visit.   Kindly see the A&P for the status of the pt's chronic medical problems.   Past Medical History  Diagnosis Date  . Pulmonary embolism May 2003     On Lifelong coumidin Factor V leiden def  . Factor V Leiden mutation   . Hepatitis C carrier   . Subarachnoid hemorrhage     Required surgery for clipping   . Suicide attempt   . Melanoma     unclear diagnosis, this is per pt report, no treatment or follow up known   Current Outpatient Prescriptions  Medication Sig Dispense Refill  . clonazePAM (KLONOPIN) 0.5 MG tablet Take 1 tablet (0.5 mg total) by mouth 2 (two) times daily as needed for anxiety.  60 tablet  3  . glucosamine-chondroitin 500-400 MG tablet Take 1 tablet by mouth 3 (three) times daily.        . hydrochlorothiazide (MICROZIDE) 12.5 MG capsule Take 1 capsule (12.5 mg total) by mouth daily.  60 capsule  6  . ibuprofen (ADVIL) 200 MG tablet Take 200 mg by mouth every 12 (twelve) hours as needed.      Marland Kitchen lisinopril (PRINIVIL,ZESTRIL) 20 MG tablet Take 1 tablet (20 mg total) by mouth daily.  60 tablet  6  . warfarin (COUMADIN) 2 MG tablet Take 1/2 tablet on Mondays/Tuesdays/Wednesdays/Fridays; Take 1 tablet all OTHER days.  30 tablet  3   No current facility-administered medications for this visit.    Family History  Problem Relation Age of Onset  . Stroke Mother 38    brain aneurysm  . Heart disease Father 54   History   Social History  . Marital Status: Married    Spouse Name: N/A    Number of Children: N/A  . Years of Education: N/A   Social History Main Topics  . Smoking status: Former Smoker    Types: Cigarettes    Quit date: 04/23/1970  . Smokeless tobacco: None  . Alcohol Use: 24.6 oz/week    21 Cans of beer, 24 Drinks containing 0.5 oz of alcohol per week     Comment: Beer occasional  . Drug Use: No  . Sexual Activity: None     Comment: lives with husband and two kids, no job, pays out of pocket, was trying for medicaid   Other Topics Concern  . None   Social History Narrative  . None   Review of Systems: Constitutional: Denies fever, chills, diaphoresis, appetite change and fatigue.  Respiratory: Denies SOB, DOE, cough, chest tightness, and wheezing. Denies chest pain. Cardiovascular: No chest pain, palpitations and leg swelling.  Gastrointestinal: No abdominal pain, nausea, vomiting, bloody stools Genitourinary: No dysuria, frequency, hematuria, or flank pain.  Musculoskeletal:  She reports right knee pain for which she takes Advil one pill every 12 hours when necessary. She does  want to try any other medications, like Tylenol, given her liver disease. She grades her pain as an 8/10, and it's usually severe with cold weather. Psych: anxiety. Patient asks for refills on Klonopin. No SA or SI  Objective:  Physical Exam: Filed Vitals:   12/16/12 1328  BP: 133/89  Pulse: 73  Temp: 98.2 F (36.8 C)  TempSrc: Oral  Height: 5\' 8"  (1.727 m)  Weight: 153 lb 3.2 oz (69.491 kg)  SpO2: 94%   General: Well nourished. No acute distress.  HEENT: Normal oral mucosa. MMM.  Lungs: CTA bilaterally. 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 Live is not  palpable. Extremities: No pedal edema. No joint swelling or tenderness. Neurologic: Normal EOM,  Alert and oriented x3. No obvious neurologic/cranial nerve deficits.  Assessment & Plan:  I have discussed my assessment and plan  with  my attending in the clinic, Dr. Josem Kaufmann as detailed under problem based charting.

## 2012-12-16 NOTE — Assessment & Plan Note (Signed)
Immunisations: Flu shot: declined Tdap: not indicated  Zostavax: not indicated Pneumovax: Pending Hep B: Up to date as indicated for hep C HepA: Up to date  Indicated Cancer Screening: Colonoscopy: Can not afford colonoscopy. Will do FOBT PAP smear: declines pap smear today. She will do it on her next visit.  Life style changes addressed: Smoking status: None smoker

## 2012-12-16 NOTE — Assessment & Plan Note (Signed)
BP Readings from Last 3 Encounters:  12/16/12 133/89  07/15/12 122/78  04/06/12 158/93    Lab Results  Component Value Date   NA 138 07/15/2012   K 3.5 07/15/2012   CREATININE 0.68 07/15/2012    Assessment: Blood pressure control: controlled Progress toward BP goal:  at goal Comments: Compliant with medications  Plan: Medications:   1. Lisinopril 20 mg daily  2. Hydrochlorothiazide 12.5 mg daily. Educational resources provided:   Self management tools provided: home blood pressure logbook;instructions for home blood pressure monitoring Other plans: Followup in 3 months.

## 2012-12-16 NOTE — Assessment & Plan Note (Signed)
Last lipid panel on 10/2011, LDL was not calculated, possibly due to hypertriglyceridemia. Calculated Non-HDL cholesterol is  215, goal 160.   Plan  - repeat lipid panel today together with direct LDL  - if still elevated will contact patient and start a start.  - CMP today for baseline liver function.  - counseled patient on Life style changes.

## 2012-12-16 NOTE — Assessment & Plan Note (Signed)
I have spent several minutes counseling the patient about the dangers of alcohol use in the setting of liver disease secondary to hepatitis C. I encouraged the patient to quit to alcohol. She verbalizes understanding of the ramifications from alcohol abuse including further insult to her the liver.

## 2012-12-17 LAB — CBC WITH DIFFERENTIAL/PLATELET
Basophils Absolute: 0.1 10*3/uL (ref 0.0–0.1)
Basophils Relative: 1 % (ref 0–1)
Eosinophils Absolute: 0.2 10*3/uL (ref 0.0–0.7)
HCT: 44.8 % (ref 36.0–46.0)
MCH: 33.5 pg (ref 26.0–34.0)
MCHC: 33.9 g/dL (ref 30.0–36.0)
Monocytes Absolute: 1.4 10*3/uL — ABNORMAL HIGH (ref 0.1–1.0)
Neutro Abs: 6.3 10*3/uL (ref 1.7–7.7)
Neutrophils Relative %: 50 % (ref 43–77)
Platelets: 443 10*3/uL — ABNORMAL HIGH (ref 150–400)
RDW: 13.4 % (ref 11.5–15.5)
WBC: 12.5 10*3/uL — ABNORMAL HIGH (ref 4.0–10.5)

## 2012-12-17 LAB — PATHOLOGIST SMEAR REVIEW

## 2012-12-17 NOTE — Progress Notes (Signed)
Case discussed with Dr. Kazibwe soon after the resident saw the patient.  We reviewed the resident's history and exam and pertinent patient test results.  I agree with the assessment, diagnosis and plan of care documented in the resident's note. 

## 2013-01-04 ENCOUNTER — Ambulatory Visit: Payer: Self-pay

## 2013-01-18 ENCOUNTER — Ambulatory Visit: Payer: Self-pay

## 2013-02-10 ENCOUNTER — Telehealth: Payer: Self-pay | Admitting: *Deleted
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
## 2013-04-29 ENCOUNTER — Other Ambulatory Visit: Payer: Self-pay | Admitting: *Deleted

## 2013-04-29 DIAGNOSIS — F411 Generalized anxiety disorder: Secondary | ICD-10-CM

## 2013-04-29 MED ORDER — CLONAZEPAM 0.5 MG PO TABS: 0.5000 mg | ORAL_TABLET | Freq: Two times a day (BID) | ORAL | Status: DC | PRN

## 2013-04-30 NOTE — Telephone Encounter (Signed)
Refill called in. 

## 2013-06-09 ENCOUNTER — Encounter: Payer: Self-pay | Admitting: Internal Medicine

## 2013-07-14 ENCOUNTER — Ambulatory Visit: Payer: Self-pay | Admitting: Internal Medicine

## 2013-07-14 ENCOUNTER — Ambulatory Visit (INDEPENDENT_AMBULATORY_CARE_PROVIDER_SITE_OTHER): Payer: Self-pay | Admitting: Internal Medicine

## 2013-07-14 ENCOUNTER — Encounter: Payer: Self-pay | Admitting: Internal Medicine

## 2013-07-14 VITALS — BP 138/91 | HR 71 | Temp 97.9°F | Ht 67.9 in | Wt 151.6 lb

## 2013-07-14 DIAGNOSIS — Z85820 Personal history of malignant melanoma of skin: Secondary | ICD-10-CM

## 2013-07-14 DIAGNOSIS — B1920 Unspecified viral hepatitis C without hepatic coma: Secondary | ICD-10-CM

## 2013-07-14 DIAGNOSIS — R7402 Elevation of levels of lactic acid dehydrogenase (LDH): Secondary | ICD-10-CM

## 2013-07-14 DIAGNOSIS — F329 Major depressive disorder, single episode, unspecified: Secondary | ICD-10-CM

## 2013-07-14 DIAGNOSIS — Z0000 Encounter for general adult medical examination without abnormal findings: Secondary | ICD-10-CM

## 2013-07-14 DIAGNOSIS — F1010 Alcohol abuse, uncomplicated: Secondary | ICD-10-CM

## 2013-07-14 DIAGNOSIS — Z7901 Long term (current) use of anticoagulants: Secondary | ICD-10-CM

## 2013-07-14 DIAGNOSIS — F3289 Other specified depressive episodes: Secondary | ICD-10-CM

## 2013-07-14 DIAGNOSIS — I10 Essential (primary) hypertension: Secondary | ICD-10-CM

## 2013-07-14 DIAGNOSIS — R7401 Elevation of levels of liver transaminase levels: Secondary | ICD-10-CM

## 2013-07-14 DIAGNOSIS — F411 Generalized anxiety disorder: Secondary | ICD-10-CM

## 2013-07-14 DIAGNOSIS — E785 Hyperlipidemia, unspecified: Secondary | ICD-10-CM

## 2013-07-14 DIAGNOSIS — I2699 Other pulmonary embolism without acute cor pulmonale: Secondary | ICD-10-CM

## 2013-07-14 DIAGNOSIS — R740 Nonspecific elevation of levels of transaminase and lactic acid dehydrogenase [LDH]: Secondary | ICD-10-CM

## 2013-07-14 LAB — COMPLETE METABOLIC PANEL WITH GFR
ALBUMIN: 3.9 g/dL (ref 3.5–5.2)
ALK PHOS: 79 U/L (ref 39–117)
ALT: 39 U/L — ABNORMAL HIGH (ref 0–35)
AST: 47 U/L — AB (ref 0–37)
BUN: 8 mg/dL (ref 6–23)
CHLORIDE: 100 meq/L (ref 96–112)
CO2: 30 mEq/L (ref 19–32)
Calcium: 9.1 mg/dL (ref 8.4–10.5)
Creat: 0.67 mg/dL (ref 0.50–1.10)
GFR, Est African American: >89 mL/min
GLUCOSE: 129 mg/dL — AB (ref 70–99)
Potassium: 4.2 mEq/L (ref 3.5–5.3)
Sodium: 137 mEq/L (ref 135–145)
TOTAL PROTEIN: 7.2 g/dL (ref 6.0–8.3)
Total Bilirubin: 0.8 mg/dL (ref 0.2–1.2)

## 2013-07-14 LAB — POCT INR: INR: 1.9

## 2013-07-14 MED ORDER — HYDROCHLOROTHIAZIDE 12.5 MG PO CAPS: 12.5000 mg | ORAL_CAPSULE | Freq: Every day | ORAL | Status: DC

## 2013-07-14 MED ORDER — CLONAZEPAM 0.5 MG PO TABS: 0.5000 mg | ORAL_TABLET | Freq: Two times a day (BID) | ORAL | Status: DC | PRN

## 2013-07-14 MED ORDER — WARFARIN SODIUM 2 MG PO TABS: ORAL_TABLET | ORAL | Status: DC

## 2013-07-14 MED ORDER — LISINOPRIL 20 MG PO TABS: 20.0000 mg | ORAL_TABLET | Freq: Every day | ORAL | Status: DC

## 2013-07-14 NOTE — Patient Instructions (Signed)
General Instructions: Follow up 4-6 months chronic medical conditions  Follow up as soon as possible with Dr. Elie Confer. At that time please schedule am appt fasting with no food and we will recheck your lipids  Pick up medications from pharmacy.  Take care   Treatment Goals:  Goals (1 Years of Data) as of 07/14/13         As of Today 12/16/12 07/15/12 04/06/12     Blood Pressure    . Blood Pressure < 130/80  138/91 133/89 122/78 158/93     Diet    . Cut out extra servings   No       Lifestyle    . Prevent Falls          Result Component    . LDL CALC < 130           Progress Toward Treatment Goals:  Treatment Goal 07/14/2013  Blood pressure at goal  Prevent falls at goal    Self Care Goals & Plans:  Self Care Goal 07/14/2013  Manage my medications take my medicines as prescribed; bring my medications to every visit; refill my medications on time  Monitor my health keep track of my blood pressure  Eat healthy foods drink diet soda or water instead of juice or soda; eat more vegetables; eat foods that are low in salt; eat baked foods instead of fried foods; eat fruit for snacks and desserts  Be physically active find an activity I enjoy  Meeting treatment goals maintain the current self-care plan    No flowsheet data found.   Care Management & Community Referrals:  Referral 07/14/2013  Referrals made for care management support none needed  Referrals made to community resources none

## 2013-07-14 NOTE — Assessment & Plan Note (Signed)
Cut back etOH consumption

## 2013-07-14 NOTE — Assessment & Plan Note (Signed)
Pt has not followed with psych Rx refill Klonopin 0.5 bid prn

## 2013-07-14 NOTE — Assessment & Plan Note (Signed)
Will ultimately need referral for tx when pt gets insurance. Prior to referral will need Hep C Viral load w/in 1 year, [place], HIV, cbc. See other options for rec. bloodwork prior to [facility] f/u  Will have pt reassess with PCP

## 2013-07-14 NOTE — Assessment & Plan Note (Signed)
As mentioned prev likely related to alcohol and HCV Will check CMET today

## 2013-07-14 NOTE — Assessment & Plan Note (Signed)
Prefers to see OB/GYN for pap smear but currently no insurance

## 2013-07-14 NOTE — Assessment & Plan Note (Signed)
BP Readings from Last 3 Encounters:  07/14/13 138/91  12/16/12 133/89  07/15/12 122/78    Lab Results  Component Value Date   NA 140 12/16/2012   K 4.0 12/16/2012   CREATININE 0.62 12/16/2012    Assessment: Blood pressure control: controlled Progress toward BP goal:  at goal Comments: none  Plan: Medications:  continue current medications (Lisinopril 20, HCTZ 12.5 mg qd) Educational resources provided: brochure Self management tools provided: home blood pressure logbook Other plans: cmet today, f/u with PCP in 4-6 months

## 2013-07-14 NOTE — Assessment & Plan Note (Signed)
Controlled.  

## 2013-07-14 NOTE — Progress Notes (Signed)
Subjective:    Patient ID: Sarah Simon, female    DOB: 1960-10-11, 53 y.o.   MRN: [id number]  HPI Comments: 53 y.o Past Medical History Pulmonary embolism (May 2003) On Lifelong coumidin Factor V leiden def, Hepatitis C carrier, Subarachnoid hemorrhage (Required surgery for clipping), Suicide attempt (early 2000s)                        Melanoma left leg, and NMSC (chest, back), HTN, dyslipidemia (TG 590, direct LDL 135, TC 260, HDL 42 12/2012), h/o alcohol abuse, h/o depression, h/o elevated transaminases.   She presents for follow up: She states she feels good.   1) She needs refills of Lisinopril 20, HCTZ 12.5 mg for HTN. BP today is 138/91 overall controlled 2) She needs Rx refill of Coumadin taking 2 mg Sun, Thurs, Sat and 1 mg other days per Dr. Elie Confer. No f/u since 11/2012. Will check INR today 3) H/o depression/anxiety.  Mood is stable. Denies SI.  She needs Rx refill of Klonopin 4) HLD-today pt only ate pear and water. Will do repeat lipid panel when pt completely fasting on same day as Dr. Elie Confer f/u  5) H/o HCV-quant done 2010.  She followed up for treatment 1 time but the MD wanted her to have a psych eval for 1 year before giving her the medication and also the medication had not been available to patients yet.  Patient interested in seeing treatment but currently no insurance   Other meds: also taking Biotin   SH: cut back drinking on drinks 3 beers daily sometimes (12 oz). No insurance meeting with Sarah Simon today; works in [employer] with her husband.  Smokes THC HM: due for pap but wants to wait to be referred to OB/GYN     Review of Systems  Respiratory: Negative for shortness of breath.   Cardiovascular: Negative for chest pain and leg swelling.  Gastrointestinal: Negative for blood in stool.  Genitourinary: Negative for hematuria and menstrual problem.  Psychiatric/Behavioral: Negative for suicidal ideas.       Objective:   Physical Exam  Nursing note and  vitals reviewed. Constitutional: She is oriented to person, place, and time. Vital signs are normal. She appears well-developed and well-nourished. She is cooperative. No distress.  HENT:  Head: Normocephalic and atraumatic.  Mouth/Throat: Oropharynx is clear and moist and mucous membranes are normal. No oropharyngeal exudate.  Eyes: Conjunctivae are normal. Pupils are equal, round, and reactive to light. Right eye exhibits no discharge. Left eye exhibits no discharge. No scleral icterus.  Cardiovascular: Normal rate, regular rhythm, S1 normal, S2 normal and normal heart sounds.   No murmur heard. No lower ext edema   Pulmonary/Chest: Effort normal and breath sounds normal. No respiratory distress. She has no wheezes.  Abdominal: Soft. Bowel sounds are normal. There is no tenderness.  Musculoskeletal:       Legs: Neurological: She is alert and oriented to person, place, and time. Gait normal.  Skin: Skin is warm and dry. No rash noted. She is not diaphoretic.  Multiple scars to chest, back, legs from previous skin cancer   Psychiatric: She has a normal mood and affect. Her speech is normal and behavior is normal. Judgment and thought content normal. Cognition and memory are normal.          Assessment & Plan:  F/u in 4-6 months with PCP to disc chronic medical conditions if at that time pt has insurance consider GYN  referral for pap (pt preference) and referral to get tx for HCV

## 2013-07-14 NOTE — Assessment & Plan Note (Signed)
Rx refill Coumadin today, INR 1.9 Will encourage pt to follow with Dr. Elie Confer ASAP

## 2013-07-14 NOTE — Assessment & Plan Note (Signed)
Sun tanned skin. Disc SPF protection

## 2013-07-14 NOTE — Assessment & Plan Note (Addendum)
Pt had pear today.  Will have back fasting for repeat lipid panel and direct LDL Framingham risk calculator 4% risk

## 2013-07-15 ENCOUNTER — Telehealth: Payer: Self-pay | Admitting: *Deleted

## 2013-07-15 ENCOUNTER — Encounter: Payer: Self-pay | Admitting: Internal Medicine

## 2013-07-15 NOTE — Telephone Encounter (Signed)
Prescription for Klonopin 0.5 mg tablets 1 po twice daily for anxiety as needed dispense # 60 with no refills called to [facility] on [address]. Per order of Dr. Aundra Dubin.  Sander Nephew, RN 07/15/2013 5:01 PM

## 2013-07-15 NOTE — Progress Notes (Signed)
Case discussed with Dr. McLean at the time of the visit.  We reviewed the resident's history and exam and pertinent patient test results.  I agree with the assessment, diagnosis, and plan of care documented in the resident's note.     

## 2013-07-19 ENCOUNTER — Ambulatory Visit: Payer: Self-pay

## 2013-08-27 ENCOUNTER — Ambulatory Visit: Payer: Self-pay

## 2013-09-01 ENCOUNTER — Ambulatory Visit: Payer: Self-pay

## 2013-09-09 ENCOUNTER — Ambulatory Visit: Payer: Self-pay

## 2013-09-13 ENCOUNTER — Ambulatory Visit: Payer: Self-pay

## 2013-09-17 ENCOUNTER — Other Ambulatory Visit: Payer: Self-pay | Admitting: Internal Medicine

## 2013-09-17 DIAGNOSIS — E785 Hyperlipidemia, unspecified: Secondary | ICD-10-CM

## 2013-09-17 NOTE — Addendum Note (Signed)
Addended by: Orson Gear on: 09/17/2013 12:11 PM   Modules accepted: Orders

## 2013-10-04 ENCOUNTER — Other Ambulatory Visit: Payer: Self-pay

## 2013-11-11 ENCOUNTER — Encounter: Payer: Self-pay | Admitting: Internal Medicine

## 2013-11-24 ENCOUNTER — Ambulatory Visit (INDEPENDENT_AMBULATORY_CARE_PROVIDER_SITE_OTHER): Payer: Self-pay | Admitting: Internal Medicine

## 2013-11-24 ENCOUNTER — Encounter: Payer: Self-pay | Admitting: Internal Medicine

## 2013-11-24 VITALS — BP 139/74 | HR 68 | Temp 98.1°F | Resp 20 | Ht 67.5 in | Wt 148.9 lb

## 2013-11-24 DIAGNOSIS — Z7901 Long term (current) use of anticoagulants: Secondary | ICD-10-CM

## 2013-11-24 DIAGNOSIS — B1920 Unspecified viral hepatitis C without hepatic coma: Secondary | ICD-10-CM

## 2013-11-24 DIAGNOSIS — E781 Pure hyperglyceridemia: Secondary | ICD-10-CM

## 2013-11-24 DIAGNOSIS — R739 Hyperglycemia, unspecified: Secondary | ICD-10-CM

## 2013-11-24 DIAGNOSIS — B182 Chronic viral hepatitis C: Secondary | ICD-10-CM

## 2013-11-24 DIAGNOSIS — Z0000 Encounter for general adult medical examination without abnormal findings: Secondary | ICD-10-CM

## 2013-11-24 DIAGNOSIS — Z2252 Carrier of viral hepatitis C: Secondary | ICD-10-CM

## 2013-11-24 DIAGNOSIS — F1010 Alcohol abuse, uncomplicated: Secondary | ICD-10-CM

## 2013-11-24 DIAGNOSIS — I10 Essential (primary) hypertension: Secondary | ICD-10-CM

## 2013-11-24 DIAGNOSIS — F411 Generalized anxiety disorder: Secondary | ICD-10-CM

## 2013-11-24 LAB — POCT GLYCOSYLATED HEMOGLOBIN (HGB A1C): Hemoglobin A1C: 5.5

## 2013-11-24 LAB — CBC
HEMATOCRIT: 43.5 % (ref 36.0–46.0)
HEMOGLOBIN: 15 g/dL (ref 12.0–15.0)
MCH: 33.4 pg (ref 26.0–34.0)
MCHC: 34.5 g/dL (ref 30.0–36.0)
MCV: 96.9 fL (ref 78.0–100.0)
Platelets: 352 10*3/uL (ref 150–400)
RBC: 4.49 MIL/uL (ref 3.87–5.11)
RDW: 13.1 % (ref 11.5–15.5)
WBC: 11.3 10*3/uL — ABNORMAL HIGH (ref 4.0–10.5)

## 2013-11-24 LAB — POCT INR: INR: 1.9

## 2013-11-24 LAB — LIPID PANEL
CHOL/HDL RATIO: 3.9 ratio
CHOLESTEROL: 213 mg/dL — AB (ref 0–200)
HDL: 54 mg/dL (ref >39)
LDL Cholesterol: 126 mg/dL — ABNORMAL HIGH (ref 0–99)
TRIGLYCERIDES: 167 mg/dL — AB (ref <150)
VLDL: 33 mg/dL (ref 0–40)

## 2013-11-24 LAB — GLUCOSE, CAPILLARY: Glucose-Capillary: 111 mg/dL — ABNORMAL HIGH (ref 70–99)

## 2013-11-24 MED ORDER — CLONAZEPAM 0.5 MG PO TABS
0.5000 mg | ORAL_TABLET | Freq: Two times a day (BID) | ORAL | Status: DC | PRN
Start: 2013-11-24 — End: 2014-01-03

## 2013-11-24 MED ORDER — FISH OIL 1000 MG PO CAPS: 2.0000 | ORAL_CAPSULE | Freq: Every day | ORAL | Status: DC

## 2013-11-24 NOTE — Assessment & Plan Note (Signed)
fobt home screening Ordered a mammogram today

## 2013-11-24 NOTE — Assessment & Plan Note (Signed)
Emphasized to the patient about need to cut back on her alcohol intake given her hepatitis C history and elevation in her liver enzymes. Patient verbalized understanding. Will need to aggressively pursue treatment of high hepatitis C.

## 2013-11-24 NOTE — Assessment & Plan Note (Signed)
Still complaining of symptoms which are well controlled with Clonazepam Plan - refill clonazepam - patient refused SSRI

## 2013-11-24 NOTE — Assessment & Plan Note (Signed)
BP Readings from Last 3 Encounters:  11/24/13 139/74  07/14/13 138/91  12/16/12 133/89    Lab Results  Component Value Date   NA 137 07/14/2013   K 4.2 07/14/2013   CREATININE 0.67 07/14/2013    Assessment: Blood pressure control:  well controlled   Progress toward BP goal:    Comments: compliant with medications   Plan: Medications:  continue with lisinopril 20 mg daily and HCTZ 12.5 mg daily. Educational resources provided: brochure Self management tools provided: instructions for home blood pressure monitoring Other plans: follow-up in 3 months.

## 2013-11-24 NOTE — Assessment & Plan Note (Signed)
She reports compliance with her medications. Will check INR

## 2013-11-24 NOTE — Progress Notes (Signed)
Patient ID: Sarah Simon, female   DOB: 12-23-1960, 53 y.o.   MRN: [id number]   Subjective:   HPI: [name] is a 53 y.o. woman with past Medical History Pulmonary embolism (May 2003) On Lifelong coumidin Factor V leiden def, Hepatitis C carrier, Subarachnoid hemorrhage (Required surgery for clipping), Suicide attempt (early 2000s) Melanoma left leg, and NMSC (chest, back), HTN, dyslipidemia (TG 590, direct LDL 135, TC 260, HDL 42 12/2012), h/o alcohol abuse, h/o depression, h/o elevated transaminases.   Reason(s) for this visit: 1. Routine follow-up visit.  No specific complaints. Recently her husband has been in the hospital after fracture of the hip. She is without health insurance and she has been seen by Bonna Gains today for [facility] card application.  Please see the A&P for the status of the pt's chronic medical problems.  ROS: Constitutional: Denies fever, chills, diaphoresis, appetite change and fatigue.  Respiratory: Denies SOB, DOE, cough, chest tightness, and wheezing. Denies chest pain. CVS: No chest pain, palpitations and leg swelling.  GI: No abdominal pain, nausea, vomiting, bloody stools GU: No dysuria, frequency, hematuria, or flank pain.  MSK: No myalgias, back pain, joint swelling, arthralgias  Psych: No depression symptoms. No SI or SA.    Objective:  Physical Exam: Filed Vitals:   11/24/13 0931  BP: 139/74  Pulse: 68  Temp: 98.1 F (36.7 C)  TempSrc: Oral  Resp: 20  Height: 5' 7.5" (1.715 m)  Weight: 148 lb 14.4 oz (67.541 kg)  SpO2: 98%   General: Well nourished. No acute distress.  HEENT: Normal oral mucosa. MMM.  Lungs: CTA bilaterally. Heart: RRR; no extra sounds or murmurs  Abdomen: Non-distended, normal bowel sounds, soft, nontender; no hepatosplenomegaly  Extremities: No pedal edema. No joint swelling or tenderness. Neurologic: Normal EOM,  Alert and oriented x3. No obvious neurologic/cranial nerve  deficits.  Assessment & Plan:  Discussed case with my attending in the clinic, Dr. Dareen Piano. See problem based charting.

## 2013-11-24 NOTE — Patient Instructions (Signed)
General Instructions: Please be sure to have abdominal ultrasound scan  Please cut back on amount of alcohol that you drink  Please come back in about 3 months  Please bring your medicines with you each time you come to clinic.  Medicines may include prescription medications, over-the-counter medications, herbal remedies, eye drops, vitamins, or other pills.   Progress Toward Treatment Goals:  Treatment Goal 07/14/2013  Blood pressure at goal  Prevent falls at goal    Self Care Goals & Plans:  Self Care Goal 11/24/2013  Manage my medications take my medicines as prescribed; refill my medications on time; bring my medications to every visit  Monitor my health keep track of my blood pressure  Eat healthy foods eat foods that are low in salt; eat baked foods instead of fried foods; drink diet soda or water instead of juice or soda  Be physically active find an activity I enjoy  Meeting treatment goals -    No flowsheet data found.   Care Management & Community Referrals:  Referral 07/14/2013  Referrals made for care management support none needed  Referrals made to community resources none      Food Choices to Lower Your Triglycerides  Triglycerides are a type of fat in your blood. High levels of triglycerides can increase the risk of heart disease and stroke. If your triglyceride levels are high, the foods you eat and your eating habits are very important. Choosing the right foods can help lower your triglycerides.  WHAT GENERAL GUIDELINES DO I NEED TO FOLLOW?  Lose weight if you are overweight.   Limit or avoid alcohol.   Fill one half of your plate with vegetables and green salads.   Limit fruit to two servings a day. Choose fruit instead of juice.   Make one fourth of your plate whole grains. Look for the word "whole" as the first word in the ingredient list.  Fill one fourth of your plate with lean protein foods.  Enjoy fatty fish (such as salmon, mackerel,  sardines, and tuna) three times a week.   Choose healthy fats.   Limit foods high in starch and sugar.  Eat more home-cooked food and less restaurant, buffet, and fast food.  Limit fried foods.  Cook foods using methods other than frying.  Limit saturated fats.  Check ingredient lists to avoid foods with partially hydrogenated oils (trans fats) in them. WHAT FOODS CAN I EAT?  Grains Whole grains, such as whole wheat or whole grain breads, crackers, cereals, and pasta. Unsweetened oatmeal, bulgur, barley, quinoa, or brown rice. Corn or whole wheat flour tortillas.  Vegetables Fresh or frozen vegetables (raw, steamed, roasted, or grilled). Green salads. Fruits All fresh, canned (in natural juice), or frozen fruits. Meat and Other Protein Products Ground beef (85% or leaner), grass-fed beef, or beef trimmed of fat. Skinless chicken or [place]. Ground chicken or [place]. Pork trimmed of fat. All fish and seafood. Eggs. Dried beans, peas, or lentils. Unsalted nuts or seeds. Unsalted canned or dry beans. Dairy Low-fat dairy products, such as skim or 1% milk, 2% or reduced-fat cheeses, low-fat ricotta or cottage cheese, or plain low-fat yogurt. Fats and Oils Tub margarines without trans fats. Light or reduced-fat mayonnaise and salad dressings. Avocado. Safflower, olive, or canola oils. Natural peanut or almond butter. The items listed above may not be a complete list of recommended foods or beverages. Contact your dietitian for more options. WHAT FOODS ARE NOT RECOMMENDED?  Grains White bread. White pasta. White  rice. Cornbread. Bagels, pastries, and croissants. Crackers that contain trans fat. Vegetables White potatoes. Corn. Creamed or fried vegetables. Vegetables in a cheese sauce. Fruits Dried fruits. Canned fruit in light or heavy syrup. Fruit juice. Meat and Other Protein Products Fatty cuts of meat. Ribs, chicken wings, bacon, sausage, bologna, salami, chitterlings, fatback, hot  dogs, bratwurst, and packaged luncheon meats. Dairy Whole or 2% milk, cream, half-and-half, and cream cheese. Whole-fat or sweetened yogurt. Full-fat cheeses. Nondairy creamers and whipped toppings. Processed cheese, cheese spreads, or cheese curds. Sweets and Desserts Corn syrup, sugars, honey, and molasses. Candy. Jam and jelly. Syrup. Sweetened cereals. Cookies, pies, cakes, donuts, muffins, and ice cream. Fats and Oils Butter, stick margarine, lard, shortening, ghee, or bacon fat. Coconut, palm kernel, or palm oils. Beverages Alcohol. Sweetened drinks (such as sodas, lemonade, and fruit drinks or punches). The items listed above may not be a complete list of foods and beverages to avoid. Contact your dietitian for more information. Document Released: 10/19/2003 Document Revised: 01/05/2013 Document Reviewed: 11/04/2012 [facility] Patient Information 2015 [place], [place]. This information is not intended to replace advice given to you by your health care provider. Make sure you discuss any questions you have with your health care provider. Fish Oil, Omega-3 Fatty Acids capsules (OTC) What is this medicine? FISH OIL, OMEGA-3 FATTY ACIDS (Fish Oil, oh MAY ga - 3 fatty AS ids) are essential fats. It is promoted to help support a healthy heart. This dietary supplement is used to add to a healthy diet. The FDA has not approved this supplement for any medical use. This supplement may be used for other purposes; ask your health care provider or pharmacist if you have questions. This medicine may be used for other purposes; ask your health care provider or pharmacist if you have questions. COMMON BRAND NAME(S): [employer], [employer] Nutritionals Omega-3 1450, Ocean Blue Omega, [place] [employer] Omega-3 2100, Omega-3, OMEGA-3 IQ DHA, Omega-3 Cotton [place], Ovega-3, [place], [place], [place] SPORT What should I tell my health care provider before I take this medicine? They need to know if you  have any of these conditions -bleeding problems -lung or breathing disease, like asthma -an unusual or allergic reaction to fish oil, omega-3 fatty acids, fish, other medicines, foods, dyes, or preservatives -pregnant or trying to get pregnant -breast-feeding How should I use this medicine? Take this medicine by mouth with a glass of water. Follow the directions on the package or prescription label. Take with food. Take your medicine at regular intervals. Do not take your medicine more often than directed. Talk to your pediatrician regarding the use of this medicine in children. Special care may be needed. This medicine should not be used in children without a doctor's advice. Overdosage: If you think you have taken too much of this medicine contact a poison control center or emergency room at once. NOTE: This medicine is only for you. Do not share this medicine with others. What if I miss a dose? If you miss a dose, take it as soon as you can. If it is almost time for your next dose, take only that dose. Do not take double or extra doses. What may interact with this medicine? -aspirin and aspirin-like medicines -herbal products like danshen, dong quai, garlic pills, ginger, ginkgo biloba, horse chestnut, willow bark, and others -medicines that treat or prevent blood clots like enoxaparin, heparin, warfarin This list may not describe all possible interactions. Give your health care provider a list of all the medicines,  herbs, non-prescription drugs, or dietary supplements you use. Also tell them if you smoke, drink alcohol, or use illegal drugs. Some items may interact with your medicine. What should I watch for while using this medicine? Follow a good diet and exercise plan. Taking a dietary supplement does not replace a healthy lifestyle. Some foods that have omega-3 fatty acids naturally are fatty fish like albacore tuna, halibut, herring, mackerel, lake trout, salmon, and sardines. Too much  of this supplement can be unsafe. Talk to your doctor or health care provider about how much of this supplement is right for you. If you are scheduled for any medical or dental procedure, tell your healthcare provider that you are taking this medicine. You may need to stop taking this medicine before the procedure. Herbal or dietary supplements are not regulated like medicines. Rigid quality control standards are not required for dietary supplements. The purity and strength of these products can vary. The safety and effect of this dietary supplement for a certain disease or illness is not well known. This product is not intended to diagnose, treat, cure or prevent any disease. The Food and Drug Administration suggests the following to help consumers protect themselves: -Always read product labels and follow directions. -Natural does not mean a product is safe for humans to take. -Look for products that include USP after the ingredient name. This means that the manufacturer followed the standards of the [place] Pharmacopoeia. -Supplements made or sold by a nationally known food or drug company are more likely to be made under tight controls. You can write to the company for more information about how the product was made. What side effects may I notice from receiving this medicine? Side effects that you should report to your doctor or health care professional as soon as possible: -allergic reactions like skin rash, itching or hives, swelling of the face, lips, or tongue -breathing problems -changes in your moods or emotions -unusual bleeding or bruising Side effects that usually do not require medical attention (report to your doctor or health care professional if they continue or are bothersome): -bad or fishy breath -belching -diarrhea -nausea -stomach gas, upset -weight gain This list may not describe all possible side effects. Call your doctor for medical advice about side effects. You may report  side effects to FDA at 1-800-FDA-1088. Where should I keep my medicine? Keep out of the reach of children. Store at room temperature or as directed on the package label. Protect from moisture. Do not freeze. Throw away any unused medicine after the expiration date. NOTE: This sheet is a summary. It may not cover all possible information. If you have questions about this medicine, talk to your doctor, pharmacist, or health care provider.  2015, Elsevier/Gold Standard. (2007-03-19 13:05:24)

## 2013-11-24 NOTE — Assessment & Plan Note (Signed)
Check a1c level today

## 2013-11-24 NOTE — Assessment & Plan Note (Addendum)
She has not been treated. She has not been to hep c clinic yet. Plan  - referral to [facility] - alcohol cessation - hiv, cbc, abdominal ultrasound scan, hep c qualitative test - follow up in three months  12/01/2013 addendum. Received the results from abdominal ultrasound scan performed today which revealed liver parenchymal heterogeneous and echogenic with mild nodularity. Findings are also concerning for liver cirrhosis. There was also a report of hepatic nodules. There was a round hypoechoic lesion in the posterior right hepatic lobe which measures 2.1 x 2.3 x 2.2 cm. In addition, an ill-defined hypoechoic lesion along the lower lever measuring up to 2.5 cm was revealed as well. Question of liver parenchymal neoplasm in a patient with a history of hepatitis C. I contacted the patient by telephone and discussed the results with her and emphasized the need for further workup in order to define these nodules. She agreed to proceed with MRI of the abdomen with and without contrast. I will request our nurses to assist to schedule for this MRI ASAP. She also has an upcoming appointment with the hepatitis C clinic on 12/13/2013.

## 2013-11-24 NOTE — Assessment & Plan Note (Addendum)
She has been noted to have increased severely elevated hypertriglyceridemia. She states that she consumes a lot of beef. Lipid Panel     Component Value Date/Time   CHOL 260* 12/16/2012 1411   TRIG 590* 12/16/2012 1411   HDL 42 12/16/2012 1411   CHOLHDL 6.2 12/16/2012 1411   VLDL NOT CALC 12/16/2012 1411   LDLCALC  12/16/2012 1411     Comment:   LDLDIRECT 135* 12/16/2012 1411    Plan -  Discussed alcohol cessation, and reduction in beef consumption - LDL has otherwise not been very high, therefore not statins indicated at this tim - will check lipid panel today, and repeat after a few months to document improvement in the TG level - Start fish oil 2 capsules daily.  - consider fibranates if TGs remain elevated

## 2013-11-25 LAB — HIV ANTIBODY (ROUTINE TESTING W REFLEX): HIV: NONREACTIVE

## 2013-11-28 NOTE — Progress Notes (Signed)
INTERNAL MEDICINE TEACHING ATTENDING ADDENDUM - Patryk Conant, MD: I reviewed and discussed at the time of visit with the resident Dr. Kazibwe, the patient's medical history, physical examination, diagnosis and results of pertinent tests and treatment and I agree with the patient's care as documented.  

## 2013-11-29 ENCOUNTER — Telehealth: Payer: Self-pay | Admitting: *Deleted

## 2013-11-29 ENCOUNTER — Other Ambulatory Visit: Payer: Self-pay | Admitting: *Deleted

## 2013-11-29 DIAGNOSIS — I2699 Other pulmonary embolism without acute cor pulmonale: Secondary | ICD-10-CM

## 2013-11-29 MED ORDER — WARFARIN SODIUM 2 MG PO TABS: ORAL_TABLET | ORAL | Status: DC

## 2013-11-29 NOTE — Telephone Encounter (Signed)
Pt called and left a message stating she needed an emergency call back, called twice, left message for rtc

## 2013-11-30 NOTE — Addendum Note (Signed)
Addended by: Truddie Crumble on: 11/30/2013 02:34 PM   Modules accepted: Orders

## 2013-12-01 ENCOUNTER — Other Ambulatory Visit: Payer: Self-pay | Admitting: Internal Medicine

## 2013-12-01 ENCOUNTER — Ambulatory Visit (HOSPITAL_COMMUNITY)
Admission: RE | Admit: 2013-12-01 | Discharge: 2013-12-01 | Disposition: A | Discharge status: Home / Self Care | Payer: Self-pay | Source: Ambulatory Visit | Attending: Internal Medicine | Admitting: Internal Medicine

## 2013-12-01 DIAGNOSIS — K7460 Unspecified cirrhosis of liver: Secondary | ICD-10-CM | POA: Insufficient documentation

## 2013-12-01 DIAGNOSIS — K769 Liver disease, unspecified: Secondary | ICD-10-CM | POA: Insufficient documentation

## 2013-12-01 DIAGNOSIS — B1920 Unspecified viral hepatitis C without hepatic coma: Secondary | ICD-10-CM | POA: Insufficient documentation

## 2013-12-01 DIAGNOSIS — B182 Chronic viral hepatitis C: Secondary | ICD-10-CM

## 2013-12-13 ENCOUNTER — Other Ambulatory Visit: Payer: No Typology Code available for payment source

## 2013-12-13 DIAGNOSIS — B182 Chronic viral hepatitis C: Secondary | ICD-10-CM

## 2013-12-13 LAB — COMPREHENSIVE METABOLIC PANEL
ALBUMIN: 3.7 g/dL (ref 3.5–5.2)
ALK PHOS: 75 U/L (ref 39–117)
ALT: 33 U/L (ref 0–35)
AST: 32 U/L (ref 0–37)
BUN: 12 mg/dL (ref 6–23)
CALCIUM: 9.2 mg/dL (ref 8.4–10.5)
CO2: 30 mEq/L (ref 19–32)
Chloride: 102 mEq/L (ref 96–112)
Creat: 0.59 mg/dL (ref 0.50–1.10)
GLUCOSE: 129 mg/dL — AB (ref 70–99)
POTASSIUM: 3.9 meq/L (ref 3.5–5.3)
Sodium: 139 mEq/L (ref 135–145)
Total Bilirubin: 0.4 mg/dL (ref 0.2–1.2)
Total Protein: 6.7 g/dL (ref 6.0–8.3)

## 2013-12-13 LAB — HEPATITIS B CORE ANTIBODY, TOTAL: Hep B Core Total Ab: NONREACTIVE

## 2013-12-13 LAB — HEPATITIS A ANTIBODY, TOTAL: HEP A TOTAL AB: REACTIVE — AB

## 2013-12-13 LAB — HEPATITIS B SURFACE ANTIBODY,QUALITATIVE: Hep B S Ab: NEGATIVE

## 2013-12-13 LAB — HEPATITIS B SURFACE ANTIGEN: Hepatitis B Surface Ag: NEGATIVE

## 2013-12-13 LAB — IRON: Iron: 153 ug/dL — ABNORMAL HIGH (ref 42–145)

## 2013-12-14 LAB — ANA: Anti Nuclear Antibody(ANA): NEGATIVE

## 2013-12-14 LAB — HEPATITIS C RNA QUANTITATIVE
HCV QUANT LOG: 5.22 {Log} — AB (ref <1.18)
HCV QUANT: 166601 [IU]/mL — AB (ref <15)

## 2013-12-17 LAB — HEPATITIS C GENOTYPE: HCV Genotype: 3

## 2013-12-24 ENCOUNTER — Ambulatory Visit (HOSPITAL_COMMUNITY)
Admission: RE | Admit: 2013-12-24 | Discharge: 2013-12-24 | Disposition: A | Discharge status: Home / Self Care | Payer: No Typology Code available for payment source | Source: Ambulatory Visit | Attending: Internal Medicine | Admitting: Internal Medicine

## 2013-12-24 DIAGNOSIS — K7460 Unspecified cirrhosis of liver: Secondary | ICD-10-CM

## 2014-01-03 ENCOUNTER — Other Ambulatory Visit: Payer: Self-pay | Admitting: *Deleted

## 2014-01-03 DIAGNOSIS — F411 Generalized anxiety disorder: Secondary | ICD-10-CM

## 2014-01-03 NOTE — Telephone Encounter (Signed)
Last refill 11/11

## 2014-01-04 MED ORDER — CLONAZEPAM 0.5 MG PO TABS: 0.5000 mg | ORAL_TABLET | Freq: Two times a day (BID) | ORAL | Status: DC | PRN

## 2014-01-04 NOTE — Telephone Encounter (Signed)
Called to pharm 

## 2014-01-19 ENCOUNTER — Ambulatory Visit: Payer: No Typology Code available for payment source | Admitting: Internal Medicine

## 2014-01-19 ENCOUNTER — Ambulatory Visit (HOSPITAL_COMMUNITY)
Admission: RE | Admit: 2014-01-19 | Discharge: 2014-01-19 | Disposition: A | Discharge status: Home / Self Care | Payer: No Typology Code available for payment source | Source: Ambulatory Visit | Attending: Internal Medicine | Admitting: Internal Medicine

## 2014-01-19 DIAGNOSIS — B1920 Unspecified viral hepatitis C without hepatic coma: Secondary | ICD-10-CM | POA: Insufficient documentation

## 2014-01-19 DIAGNOSIS — K7460 Unspecified cirrhosis of liver: Secondary | ICD-10-CM | POA: Insufficient documentation

## 2014-01-19 MED ORDER — GADOXETATE DISODIUM 0.25 MMOL/ML IV SOLN
5.0000 mL | Freq: Once | INTRAVENOUS | Status: AC | PRN
  Administered 2014-01-19: 7 mL via INTRAVENOUS

## 2014-01-25 ENCOUNTER — Ambulatory Visit: Payer: No Typology Code available for payment source | Admitting: Internal Medicine

## 2014-02-02 NOTE — Addendum Note (Signed)
Addended by: Hulan Fray on: 02/02/2014 09:34 PM   Modules accepted: Orders

## 2014-02-24 ENCOUNTER — Other Ambulatory Visit: Payer: Self-pay | Admitting: *Deleted

## 2014-02-24 DIAGNOSIS — I2699 Other pulmonary embolism without acute cor pulmonale: Secondary | ICD-10-CM

## 2014-02-25 MED ORDER — WARFARIN SODIUM 2 MG PO TABS: ORAL_TABLET | ORAL | Status: DC

## 2014-03-09 ENCOUNTER — Encounter: Payer: Self-pay | Admitting: Internal Medicine

## 2014-03-09 ENCOUNTER — Ambulatory Visit (INDEPENDENT_AMBULATORY_CARE_PROVIDER_SITE_OTHER): Payer: No Typology Code available for payment source | Admitting: Internal Medicine

## 2014-03-09 VITALS — BP 175/97 | HR 61 | Temp 98.2°F | Ht 67.5 in | Wt 150.0 lb

## 2014-03-09 DIAGNOSIS — Z23 Encounter for immunization: Secondary | ICD-10-CM

## 2014-03-09 DIAGNOSIS — K7460 Unspecified cirrhosis of liver: Secondary | ICD-10-CM

## 2014-03-09 DIAGNOSIS — B182 Chronic viral hepatitis C: Secondary | ICD-10-CM

## 2014-03-09 MED ORDER — SOFOSBUVIR 400 MG PO TABS: 400.0000 mg | ORAL_TABLET | Freq: Every day | ORAL | Status: DC

## 2014-03-09 MED ORDER — DACLATASVIR DIHYDROCHLORIDE 60 MG PO TABS: 60.0000 mg | ORAL_TABLET | Freq: Every day | ORAL | Status: DC

## 2014-03-09 NOTE — Patient Instructions (Signed)
Date 03/09/2014  Dear Ms Sarah Simon, As discussed in the [facility], your hepatitis C therapy will include the following medications:     sofosbuvir 400 mg oral daily + daclatasvir 60 mg oral daily  Please note that ALL MEDICATIONS WILL START ON THE SAME DATE for a total of 12 weeks. ---------------------------------------------------------------- Your HCV Treatment Start Date: TBA   Your HCV genotype: 3    Liver Fibrosis: cirrhosis   ---------------------------------------------------------------- YOUR PHARMACY CONTACT:   [place] [facility] [place] Phone: [phone] Hours: Monday to Friday 7:30 am to 6:00 pm   Please always contact your pharmacy at least 3-4 business days before you run out of medications to ensure your next month's medication is ready or 1 week prior to running out if you receive it by mail.  Remember, each prescription is for 28 days. ---------------------------------------------------------------- GENERAL NOTES REGARDING YOUR HEPATITIS C MEDICATION:  SOFOSBUVIR (SOVALDI): - Sofosbuvir 400 mg tablet is taken daily with OR without food. - The tablets are yellow. - The tablets should be stored at room temperature. - The common side effects with sofosbuvir:      1. Fatigue      2. Headache  - Acid reducing agents such as H2 blockers (ie. Pepcid (famotidine), Zantac (ranitidine), Tagamet (cimetidine), Axid (nizatidine) and proton pump inhibitors (ie. Prilosec (omeprazole), Protonix (pantoprazole), Nexium (esomeprazole), or Aciphex (rabeprazole)) can decrease effectiveness of Harvoni. Do not take until you have discussed with a health care provider.    -Antacids that contain magnesium and/or aluminum hydroxide (ie. Milk of Magensia, Rolaids, Gaviscon, Maalox, Mylanta, an dArthritis Pain Formula)can reduce absorption of sofosbuvir, so take them at least 4 hours before or after Harvoni.  -Calcium  carbonate (calcium supplements or antacids such as Tums, Caltrate, Os-Cal)needs to be taken at least 4 hours hours before or after sofosbuvir.  -St. John's wort or any products that contain St. John's wort like some herbal supplements  Please inform the office prior to starting any of these medications.  - The common side effects with sofosbuvir:      1. Fatigue      2. Headache      3. Nausea      4. Diarrhea      5. Insomnia  DACLATASVIR [facility]) - The common side effects with daclatasvir:      1. Fatigue      2. Headache   Support Path is a suite of resources designed to help patients start with HARVONI and SOVALDI move toward treatment completion [place] helps patients access therapy and get off to an efficient start  Benefits investigation and prior authorization support Co-pay and other financial assistance A specialty pharmacy finder CO-PAY COUPON The sofosbuvir co-pay coupon may help eligible patients lower their out-of-pocket costs. With a co-pay coupon, most eligible patients may pay no more than $5 per co-pay (restrictions apply) www.harvoni.com call 9138459971 Not valid for patients enrolled in government healthcare prescription drug programs, such as Medicare Part D and Medicaid. Patients in the coverage gap known as the "donut hole" also are not eligible   Please note that this only lists the most common side effects and is NOT a comprehensive list of the potential side effects of these medications. For more information, please review the drug information sheets that come with your medication package from the pharmacy.  ---------------------------------------------------------------- GENERAL HELPFUL HINTS ON HCV THERAPY: 1. No alcohol. 2. Protect against sun-sensitivity/sunburns (wear sunglasses, hat,  long sleeves, pants and sunscreen). 3. Stay well-hydrated/well-moisturized. 4. Notify the [facility] of any changes in your other  over-the-counter/herbal or prescription medications. 5. If you miss a dose of your medication, take the missed dose as soon as you remember. Return to your regular time/dose schedule the next day.  6.  Do not stop taking your medications without first talking with your healthcare provider. 7.  You may take Tylenol (acetaminophen), as long as the dose is less than 2000 mg (OR no more than 4 tablets of the Tylenol Extra Strengths 500mg  tablet) in 24 hours. 8.  You will need to obtain routine labs and/or office visits at [facility] at weeks 2, 4, 8,  and 12 as well as 12 and 24 weeks after completion of treatment.   Sarah Simon, [place] for [place],   [phone]

## 2014-03-09 NOTE — Progress Notes (Signed)
+Sarah Simon is a 54 y.o. female who presents for initial evaluation and management of a positive Hepatitis C antibody test.  Patient tested positive 6 years ago. Hepatitis C risk factors present are: history of snorting drugs over 10 years ago. Patient denies acupuncture, history of blood transfusion, IV drug abuse, multiple sexual partners, renal dialysis, sexual contact with person with liver disease, tattoos. Patient has had other studies performed. Results: hepatitis C RNA by PCR, result: positive. Patient has not had prior treatment for Hepatitis C. Patient does have a past history of liver disease. Patient does not have a family history of liver disease.   HPI: Recently noted cirrhosis on ultrasound and MRI done with concern for [facility] though was negative.  Confirmed cirrhosis.  Does drink occasionally.    Patient does have documented immunity to Hepatitis A. Patient does not have documented immunity to Hepatitis B.     Review of Systems A comprehensive review of systems was negative.   Past Medical History  Diagnosis Date  . Pulmonary embolism May 2003     On Lifelong coumidin Factor V leiden def  . Factor V Leiden mutation   . Hepatitis C carrier   . Subarachnoid hemorrhage     Required surgery for clipping   . Suicide attempt   . Melanoma     unclear diagnosis, this is per pt report, no treatment or follow up known. per pt on left leg  . Skin cancer     multiple chest and back     Prior to Admission medications   Medication Sig Start Date End Date Taking? Authorizing Provider  clonazePAM (KLONOPIN) 0.5 MG tablet Take 1 tablet (0.5 mg total) by mouth 2 (two) times daily as needed for anxiety. 01/04/14  Yes Jessee Avers, MD  glucosamine-chondroitin 500-400 MG tablet Take 1 tablet by mouth 3 (three) times daily.     Yes Historical Provider, MD  hydrochlorothiazide (MICROZIDE) 12.5 MG capsule Take 1 capsule (12.5 mg total) by mouth daily. 07/14/13  Yes Cresenciano Genre, MD   ibuprofen (ADVIL) 200 MG tablet Take 200 mg by mouth every 12 (twelve) hours as needed.   Yes Historical Provider, MD  lisinopril (PRINIVIL,ZESTRIL) 20 MG tablet Take 1 tablet (20 mg total) by mouth daily. 07/14/13  Yes Cresenciano Genre, MD  warfarin (COUMADIN) 2 MG tablet Take 1/2 tablet on Mondays/Tuesdays/Wednesdays/Fridays; Take 1 tablet all OTHER days. 02/25/14  Yes Jessee Avers, MD    No Known Allergies  History  Substance Use Topics  . Smoking status: Former Smoker    Types: Cigarettes    Quit date: 04/23/1970  . Smokeless tobacco: Not on file  . Alcohol Use: 20.4 oz/week    10 Cans of beer, 24 Standard drinks or equivalent per week     Comment: Beer occasional    Family History  Problem Relation Age of Onset  . Stroke Mother 35    brain aneurysm  . Heart disease Father 54      Objective:   Filed Vitals:   03/09/14 1426  BP: 175/97  Pulse: 61  Temp: 98.2 F (36.8 C)   in no apparent distress and alert HEENT: anicteric Cor RRR and No murmurs clear Bowel sounds are normal, liver is not enlarged, spleen is not enlarged peripheral pulses normal, no pedal edema, no clubbing or cyanosis negative for - jaundice, spider hemangioma, telangiectasia, palmar erythema, ecchymosis and atrophy  Laboratory Genotype:  Lab Results  Component Value Date   HCVGENOTYPE 3  12/13/2013   HCV viral load:  Lab Results  Component Value Date   WBC 11.3* 11/24/2013   HGB 15.0 11/24/2013   HCT 43.5 11/24/2013   MCV 96.9 11/24/2013   PLT 352 11/24/2013    Lab Results  Component Value Date   CREATININE 0.59 12/13/2013   BUN 12 12/13/2013   NA 139 12/13/2013   K 3.9 12/13/2013   CL 102 12/13/2013   CO2 30 12/13/2013    Lab Results  Component Value Date   ALT 33 12/13/2013   AST 32 12/13/2013   ALKPHOS 75 12/13/2013   BILITOT 0.4 12/13/2013     Assessment: Chronic Hepatitis C genotype 3  Plan: 1) Patient counseled extensively on limiting acetaminophen to no more than  2 grams daily, avoidance of alcohol. 2) Transmission discussed with patient including sexual transmission, sharing razors and toothbrush.   3) Will need referral to gastroenterology (though uninsured and no access available) for cirrhosis and ultrasound every 6 months 4) Will need referral for substance abuse counseling: No. 5) Will prescribe sofosbuvir 400 mg daily and daclatasvir 60 mg daily for 12 weeks.  Counseled the patient on side effects and monitoring.   6) Follow up 2 weeks after starting medication if approved or in 1 year.

## 2014-04-07 ENCOUNTER — Ambulatory Visit: Payer: No Typology Code available for payment source

## 2014-04-27 ENCOUNTER — Encounter: Payer: Self-pay | Admitting: Internal Medicine

## 2014-04-27 ENCOUNTER — Ambulatory Visit (INDEPENDENT_AMBULATORY_CARE_PROVIDER_SITE_OTHER): Payer: Self-pay | Admitting: Internal Medicine

## 2014-04-27 VITALS — BP 130/80 | HR 78 | Temp 97.7°F | Ht 65.0 in | Wt 150.3 lb

## 2014-04-27 DIAGNOSIS — I10 Essential (primary) hypertension: Secondary | ICD-10-CM

## 2014-04-27 DIAGNOSIS — F1010 Alcohol abuse, uncomplicated: Secondary | ICD-10-CM

## 2014-04-27 DIAGNOSIS — M19021 Primary osteoarthritis, right elbow: Secondary | ICD-10-CM

## 2014-04-27 DIAGNOSIS — Z0000 Encounter for general adult medical examination without abnormal findings: Secondary | ICD-10-CM

## 2014-04-27 DIAGNOSIS — Z7901 Long term (current) use of anticoagulants: Secondary | ICD-10-CM

## 2014-04-27 DIAGNOSIS — K7460 Unspecified cirrhosis of liver: Secondary | ICD-10-CM

## 2014-04-27 DIAGNOSIS — F1020 Alcohol dependence, uncomplicated: Secondary | ICD-10-CM

## 2014-04-27 DIAGNOSIS — F32A Depression, unspecified: Secondary | ICD-10-CM

## 2014-04-27 DIAGNOSIS — F411 Generalized anxiety disorder: Secondary | ICD-10-CM

## 2014-04-27 DIAGNOSIS — Z86711 Personal history of pulmonary embolism: Secondary | ICD-10-CM

## 2014-04-27 DIAGNOSIS — F329 Major depressive disorder, single episode, unspecified: Secondary | ICD-10-CM

## 2014-04-27 DIAGNOSIS — M1990 Unspecified osteoarthritis, unspecified site: Secondary | ICD-10-CM | POA: Insufficient documentation

## 2014-04-27 DIAGNOSIS — M1711 Unilateral primary osteoarthritis, right knee: Secondary | ICD-10-CM

## 2014-04-27 DIAGNOSIS — B182 Chronic viral hepatitis C: Secondary | ICD-10-CM

## 2014-04-27 LAB — POCT INR: INR: 2.6

## 2014-04-27 MED ORDER — CLONAZEPAM 0.5 MG PO TABS: 0.5000 mg | ORAL_TABLET | Freq: Two times a day (BID) | ORAL | Status: DC | PRN

## 2014-04-27 NOTE — Assessment & Plan Note (Signed)
BP Readings from Last 3 Encounters:  04/27/14 130/80  03/09/14 175/97  11/24/13 139/74    Lab Results  Component Value Date   NA 139 12/13/2013   K 3.9 12/13/2013   CREATININE 0.59 12/13/2013    Assessment: Blood pressure control: controlled Progress toward BP goal:  at goal Comments: compliant with meds  Plan: Medications:  Continue HCTZ 25 mg daily Educational resources provided: other (see comments) Self management tools provided:   Other plans: Routine follow-up

## 2014-04-27 NOTE — Assessment & Plan Note (Signed)
Refilled Klonopin

## 2014-04-27 NOTE — Progress Notes (Signed)
Internal Medicine Clinic Attending  Case discussed with Dr. Kazibwe soon after the resident saw the patient.  We reviewed the resident's history and exam and pertinent patient test results.  I agree with the assessment, diagnosis, and plan of care documented in the resident's note. 

## 2014-04-27 NOTE — Patient Instructions (Signed)
General Instructions: Please be sure to take your blood pressure pills Please come back for Pap Smear on 05/02/2014 Please stop taking alcohol Please use the stool cards and mail them back Please come back in 6 months   Please bring your medicines with you each time you come to clinic.  Medicines may include prescription medications, over-the-counter medications, herbal remedies, eye drops, vitamins, or other pills.   Progress Toward Treatment Goals:  Treatment Goal 04/27/2014  Blood pressure at goal  Prevent falls -    Self Care Goals & Plans:  Self Care Goal 04/27/2014  Manage my medications take my medicines as prescribed; bring my medications to every visit  Monitor my health keep track of my blood glucose  Eat healthy foods drink diet soda or water instead of juice or soda; eat more vegetables; eat foods that are low in salt  Be physically active -  Meeting treatment goals -    No flowsheet data found.   Care Management & Community Referrals:  Referral 04/27/2014  Referrals made for care management support none needed  Referrals made to community resources -

## 2014-04-27 NOTE — Addendum Note (Signed)
Addended by: Jessee Avers on: 04/27/2014 05:10 PM   Modules accepted: Level of Service

## 2014-04-27 NOTE — Assessment & Plan Note (Signed)
INR 2.6. She can not afford NOACs Plan  Cont with current dose Follow up with Dr Elie Confer.

## 2014-04-27 NOTE — Progress Notes (Signed)
Patient ID: Sarah Simon, female   DOB: Jun 08, 1960, 54 y.o.   MRN: [id number]    Subjective:   HPI: Sarah Simon is a 54 y.o. woman with past medical history woman with past medical history of hep C, liver cirrhosis, hypertension, alcohol dependence, depression, GAD, and h/o PE and on chronic Coumadin who presents for routine visit.  No complaints today.  Most recently, patient was prescribed antiviral treatment for hep C by Dr Sarah Simon of [facility] in 02/2013, however, she cannot afford the medications as they cost $200k per her report. She has no health insurance currently. She is hoping that she will qualify for free medications from the pharmaceutical company. However, in order to apply for this she needs to file her taxes for 2015 which she is doing in a few days.  Kindly see the A&P for the status of the pt's chronic medical problems.  ROS: Constitutional: Denies fever, chills, diaphoresis, appetite change and fatigue.  Respiratory: Denies SOB, DOE, cough, chest tightness, and wheezing. Denies chest pain. CVS: No chest pain, palpitations and leg swelling.  GI: No abdominal pain, nausea, vomiting, bloody stools GU: No dysuria, frequency, hematuria, or flank pain.  MSK: She reports pains in her right shoulder and right elbow and right knee, which she attributes to arthritis. She takes Ibuprofen twice a day. No myalgias, back pain. Psych: Depression and anxiety symptoms are well controlled. No SI or SA.    Objective:  Physical Exam: Filed Vitals:   04/27/14 1332  BP: 130/80  Pulse: 78  Temp: 97.7 F (36.5 C)  TempSrc: Oral  Height: 5\' 5"  (1.651 m)  Weight: 150 lb 4.8 oz (68.176 kg)  SpO2: 98%   General: Well nourished. No acute distress.  HEENT: Normal oral mucosa. MMM.  Lungs: CTA bilaterally. Heart: RRR; no extra sounds or murmurs  Abdomen: Non-distended, normal bowel sounds, soft, nontender; no hepatosplenomegaly  Extremities: No pedal edema. No joint swelling or  tenderness. Neurologic: Normal EOM,  Alert and oriented x3. No obvious neurologic/cranial nerve deficits.  Assessment & Plan:  Discussed case with my attending in the clinic, Dr. Ellwood Dense See problem based charting.

## 2014-04-27 NOTE — Assessment & Plan Note (Signed)
Symptoms stable. Patient refuses antidepressant.  Will clinically monitor.

## 2014-04-27 NOTE — Assessment & Plan Note (Signed)
She has FOBT cards at home but has not used them. I encouraged her to use them and mail them back She can not afford a mammogram  She is interested in Pap smear clinic on 05/02/2014. She promised to come for it.

## 2014-04-27 NOTE — Assessment & Plan Note (Signed)
Encouraged patient to stop alcohol. She want to quit after she starts treatment for hep C. I told her that the sooner she quits alcohol the better for her health and liver disease.

## 2014-04-27 NOTE — Assessment & Plan Note (Signed)
Liver cirrhosis due to both chronic hep C and alcohol dependence. She continues to drink 4 beers a day. Clinical exam without stigmata of chronic liver disease. She is been unable to start her antiviral therapy for hep C due to lack of health insurance and marginal income.  Plan  Recheck abdominal ultrasound scan in July 2016 for [facility] surveillance.  Hopefully, she will be approved for medication assistance with her hep C antiviral therapy Follow up with [facility] by Dr. Linus Salmons

## 2014-04-27 NOTE — Assessment & Plan Note (Signed)
She reports mild pains in multiple joints, but mainly on the right elbow and right knee. Imaging as stated above in the overview. Pain is well controlled with Ibuprofen 200 mg bid prn. She is hesitant to try Tylenol due to her liver disease. Plan Discussed risks and benefits of continuing with anti-inflammatory medications on long-term basis. I encouraged her that if she needs to use Tylenol. She has to limit to less than 2 g per day. Renal function has previously been unremarkable

## 2014-04-27 NOTE — Assessment & Plan Note (Signed)
She has not started treatment for hep C due to lack of insurance. Plan She is hoping to qualify for medication assistance by the pharmaceutical company Will continue to monitor clinically Will continue with every 6 months abdominal ultrasound scan for [facility] surveillance Counseled on dangers of continues alcohol use with liver disease.

## 2014-05-09 ENCOUNTER — Telehealth: Payer: Self-pay | Admitting: *Deleted

## 2014-05-09 NOTE — Telephone Encounter (Addendum)
Prescription for Clonazepam 0.5 mg tablets dispense # 60 1 po twice daily as needed for anxiety refill x 3 called to [facility] per order of Dr. Alice Rieger.  Sander Nephew, RN 05/09/2014 5:19 PM

## 2014-05-31 ENCOUNTER — Encounter: Payer: Self-pay | Admitting: *Deleted

## 2014-06-02 ENCOUNTER — Ambulatory Visit: Payer: Self-pay

## 2014-06-02 ENCOUNTER — Other Ambulatory Visit: Payer: Self-pay | Admitting: Internal Medicine

## 2014-06-02 MED ORDER — SOFOSBUVIR 400 MG PO TABS: 400.0000 mg | ORAL_TABLET | Freq: Every day | ORAL | Status: DC

## 2014-06-02 MED ORDER — RIBAVIRIN 200 MG PO TABS: ORAL_TABLET | ORAL | Status: DC

## 2014-06-08 ENCOUNTER — Telehealth: Payer: Self-pay | Admitting: *Deleted

## 2014-06-08 NOTE — Telephone Encounter (Signed)
Pt has received Solvaldi, not the other medication.  Pt wanting to know when to expect the other drug.

## 2014-06-08 NOTE — Telephone Encounter (Signed)
I'll call the pharmacy to see if we can give her the ribavirin.

## 2014-06-15 ENCOUNTER — Other Ambulatory Visit: Payer: Self-pay | Admitting: Internal Medicine

## 2014-06-15 ENCOUNTER — Telehealth: Payer: Self-pay | Admitting: *Deleted

## 2014-06-15 DIAGNOSIS — B182 Chronic viral hepatitis C: Secondary | ICD-10-CM

## 2014-06-15 NOTE — Telephone Encounter (Signed)
Left patient a voicemail to call the clinic to schedule a lab appt in 2 weeks and follow up with Dr. Linus Salmons in 4 weeks. Myrtis Hopping

## 2014-07-15 ENCOUNTER — Other Ambulatory Visit: Payer: Self-pay

## 2014-07-20 ENCOUNTER — Other Ambulatory Visit: Payer: Self-pay

## 2014-07-20 DIAGNOSIS — B182 Chronic viral hepatitis C: Secondary | ICD-10-CM

## 2014-07-20 LAB — CBC WITH DIFFERENTIAL/PLATELET
BASOS ABS: 0.1 10*3/uL (ref 0.0–0.1)
Basophils Relative: 1 % (ref 0–1)
EOS PCT: 2 % (ref 0–5)
Eosinophils Absolute: 0.2 10*3/uL (ref 0.0–0.7)
HCT: 39.2 % (ref 36.0–46.0)
Hemoglobin: 12.9 g/dL (ref 12.0–15.0)
LYMPHS ABS: 3.6 10*3/uL (ref 0.7–4.0)
LYMPHS PCT: 32 % (ref 12–46)
MCH: 32.8 pg (ref 26.0–34.0)
MCHC: 32.9 g/dL (ref 30.0–36.0)
MCV: 99.7 fL (ref 78.0–100.0)
MPV: 9.7 fL (ref 8.6–12.4)
Monocytes Absolute: 1.5 10*3/uL — ABNORMAL HIGH (ref 0.1–1.0)
Monocytes Relative: 13 % — ABNORMAL HIGH (ref 3–12)
NEUTROS ABS: 5.8 10*3/uL (ref 1.7–7.7)
Neutrophils Relative %: 52 % (ref 43–77)
PLATELETS: 405 10*3/uL — AB (ref 150–400)
RBC: 3.93 MIL/uL (ref 3.87–5.11)
RDW: 12.9 % (ref 11.5–15.5)
WBC: 11.2 10*3/uL — AB (ref 4.0–10.5)

## 2014-08-29 ENCOUNTER — Other Ambulatory Visit: Payer: Self-pay | Admitting: Internal Medicine

## 2014-08-29 DIAGNOSIS — I2699 Other pulmonary embolism without acute cor pulmonale: Secondary | ICD-10-CM

## 2014-08-29 DIAGNOSIS — I10 Essential (primary) hypertension: Secondary | ICD-10-CM

## 2014-08-29 NOTE — Telephone Encounter (Signed)
Pt called requesting warfarin, lisinopril and hydrochlorotiazide to be filled @ [facility] on [address].

## 2014-08-30 MED ORDER — WARFARIN SODIUM 2 MG PO TABS: ORAL_TABLET | ORAL | Status: DC

## 2014-08-30 MED ORDER — HYDROCHLOROTHIAZIDE 12.5 MG PO CAPS: 12.5000 mg | ORAL_CAPSULE | Freq: Every day | ORAL | Status: DC

## 2014-08-30 MED ORDER — LISINOPRIL 20 MG PO TABS: 20.0000 mg | ORAL_TABLET | Freq: Every day | ORAL | Status: DC

## 2014-09-01 ENCOUNTER — Telehealth: Payer: Self-pay | Admitting: *Deleted

## 2014-09-01 NOTE — Telephone Encounter (Signed)
Pt called and stated  Her meds were not ready and wanted to know why, i called the pharm, were ready since 8/16, total cost 32.10, informed pt

## 2014-09-05 ENCOUNTER — Encounter: Payer: Self-pay | Admitting: Internal Medicine

## 2014-09-13 ENCOUNTER — Emergency Department (HOSPITAL_COMMUNITY)
Admission: EM | Admit: 2014-09-13 | Discharge: 2014-09-13 | Disposition: A | Discharge status: Home / Self Care | Payer: Self-pay | Attending: Emergency Medicine | Admitting: Emergency Medicine

## 2014-09-13 ENCOUNTER — Encounter (HOSPITAL_COMMUNITY): Payer: Self-pay | Admitting: Emergency Medicine

## 2014-09-13 ENCOUNTER — Emergency Department (HOSPITAL_COMMUNITY): Payer: Self-pay

## 2014-09-13 DIAGNOSIS — R079 Chest pain, unspecified: Secondary | ICD-10-CM | POA: Insufficient documentation

## 2014-09-13 DIAGNOSIS — Z79899 Other long term (current) drug therapy: Secondary | ICD-10-CM | POA: Insufficient documentation

## 2014-09-13 DIAGNOSIS — M545 Low back pain: Secondary | ICD-10-CM | POA: Insufficient documentation

## 2014-09-13 DIAGNOSIS — M5489 Other dorsalgia: Secondary | ICD-10-CM

## 2014-09-13 DIAGNOSIS — Z7901 Long term (current) use of anticoagulants: Secondary | ICD-10-CM | POA: Insufficient documentation

## 2014-09-13 DIAGNOSIS — R0602 Shortness of breath: Secondary | ICD-10-CM | POA: Insufficient documentation

## 2014-09-13 DIAGNOSIS — Z86711 Personal history of pulmonary embolism: Secondary | ICD-10-CM | POA: Insufficient documentation

## 2014-09-13 DIAGNOSIS — Z85828 Personal history of other malignant neoplasm of skin: Secondary | ICD-10-CM | POA: Insufficient documentation

## 2014-09-13 DIAGNOSIS — Z87891 Personal history of nicotine dependence: Secondary | ICD-10-CM | POA: Insufficient documentation

## 2014-09-13 DIAGNOSIS — Z8619 Personal history of other infectious and parasitic diseases: Secondary | ICD-10-CM | POA: Insufficient documentation

## 2014-09-13 LAB — BASIC METABOLIC PANEL
ANION GAP: 8 (ref 5–15)
BUN: 7 mg/dL (ref 6–20)
CO2: 23 mmol/L (ref 22–32)
Calcium: 9.2 mg/dL (ref 8.9–10.3)
Chloride: 108 mmol/L (ref 101–111)
Creatinine, Ser: 0.56 mg/dL (ref 0.44–1.00)
Glucose, Bld: 119 mg/dL — ABNORMAL HIGH (ref 65–99)
POTASSIUM: 3.3 mmol/L — AB (ref 3.5–5.1)
SODIUM: 139 mmol/L (ref 135–145)

## 2014-09-13 LAB — CBC
HEMATOCRIT: 36.8 % (ref 36.0–46.0)
HEMOGLOBIN: 12.3 g/dL (ref 12.0–15.0)
MCH: 33 pg (ref 26.0–34.0)
MCHC: 33.4 g/dL (ref 30.0–36.0)
MCV: 98.7 fL (ref 78.0–100.0)
Platelets: 309 10*3/uL (ref 150–400)
RBC: 3.73 MIL/uL — ABNORMAL LOW (ref 3.87–5.11)
RDW: 13.4 % (ref 11.5–15.5)
WBC: 11.7 10*3/uL — AB (ref 4.0–10.5)

## 2014-09-13 LAB — PROTIME-INR
INR: 2.74 — AB (ref 0.00–1.49)
Prothrombin Time: 28.6 seconds — ABNORMAL HIGH (ref 11.6–15.2)

## 2014-09-13 LAB — I-STAT TROPONIN, ED: TROPONIN I, POC: 0.01 ng/mL (ref 0.00–0.08)

## 2014-09-13 NOTE — Discharge Instructions (Signed)

## 2014-09-13 NOTE — ED Provider Notes (Signed)
CSN: [id number]     Arrival date & time 09/13/14  1027 History   First MD Initiated Contact with Patient 09/13/14 1030     Chief Complaint  Patient presents with  . Chest Pain  . Shortness of Breath     (Consider location/radiation/quality/duration/timing/severity/associated sxs/prior Treatment) Patient is a 55 y.o. female presenting with chest pain. The history is provided by the patient.  Chest Pain Pain location:  L chest (and central) Pain quality: aching   Pain radiates to:  Does not radiate Pain radiates to the back: no (started in back and migrated)   Pain severity:  Moderate Onset quality:  Gradual Duration:  3 days Timing:  Constant Progression:  Waxing and waning Chronicity:  New Context: breathing and at rest   Relieved by:  Nothing Worsened by:  Nothing tried Ineffective treatments:  None tried Associated symptoms: back pain   Associated symptoms: no fever, no lower extremity edema and no shortness of breath   Risk factors: prior DVT/PE (anticoagulated on coumadin)     Past Medical History  Diagnosis Date  . Pulmonary embolism May 2003     On Lifelong coumidin Factor V leiden def  . Factor V Leiden mutation   . Hepatitis C carrier   . Subarachnoid hemorrhage     Required surgery for clipping   . Suicide attempt   . Melanoma     unclear diagnosis, this is per pt report, no treatment or follow up known. per pt on left leg  . Skin cancer     multiple chest and back    Past Surgical History  Procedure Laterality Date  . Craniotomy    . 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   Family History  Problem Relation Age of Onset  . Stroke Mother 69    brain aneurysm  . Heart disease Father 40   Social History  Substance Use Topics  . Smoking status: Former Smoker    Types: Cigarettes    Quit date: 04/23/1970  . Smokeless tobacco: None  . Alcohol Use: 20.4 oz/week    10 Cans of beer, 24 Standard drinks or equivalent per week     Comment: Beer  occasional   OB History    No data available     Review of Systems  Constitutional: Negative for fever.  Respiratory: Negative for shortness of breath.   Cardiovascular: Positive for chest pain.  Musculoskeletal: Positive for back pain.  All other systems reviewed and are negative.     Allergies  Review of patient's allergies indicates no known allergies.  Home Medications   Prior to Admission medications   Medication Sig Start Date End Date Taking? Authorizing Provider  clonazePAM (KLONOPIN) 0.5 MG tablet Take 1 tablet (0.5 mg total) by mouth 2 (two) times daily as needed for anxiety. 04/27/14  Yes Jessee Avers, MD  hydrochlorothiazide (MICROZIDE) 12.5 MG capsule Take 1 capsule (12.5 mg total) by mouth daily. 08/30/14  Yes Burgess Estelle, MD  ibuprofen (ADVIL) 200 MG tablet Take 200 mg by mouth every 12 (twelve) hours as needed for headache or moderate pain.    Yes Historical Provider, MD  lisinopril (PRINIVIL,ZESTRIL) 20 MG tablet Take 1 tablet (20 mg total) by mouth daily. 08/30/14  Yes Burgess Estelle, MD  ribavirin (COPEGUS) 200 MG tablet 600 mg in am and 400 mg in pm 06/02/14  Yes Thayer Headings, MD  Sofosbuvir 400 MG TABS Take 400 mg by mouth daily.  06/02/14  Yes Thayer Headings, MD  warfarin (COUMADIN) 2 MG tablet Take 1/2 tablet on Mondays/Tuesdays/Wednesdays/Fridays; Take 1 tablet all OTHER days. 08/30/14  Yes Burgess Estelle, MD   BP 127/76 mmHg  Pulse 71  Temp(Src) 98.4 F (36.9 C) (Oral)  Resp 17  SpO2 98% Physical Exam  Constitutional: She is oriented to person, place, and time. She appears well-developed and well-nourished. No distress.  HENT:  Head: Normocephalic.  Eyes: Conjunctivae are normal.  Neck: Neck supple. No tracheal deviation present.  Cardiovascular: Normal rate, regular rhythm and normal heart sounds.   Pulmonary/Chest: Effort normal and breath sounds normal. No respiratory distress. She has no wheezes. She has no rales. She exhibits no tenderness.   Abdominal: Soft. She exhibits no distension. There is no tenderness.  Neurological: She is alert and oriented to person, place, and time.  Skin: Skin is warm and dry.  Psychiatric: She has a normal mood and affect.    ED Course  Procedures (including critical care time) Labs Review Labs Reviewed  BASIC METABOLIC PANEL - Abnormal; Notable for the following:    Potassium 3.3 (*)    Glucose, Bld 119 (*)    All other components within normal limits  CBC - Abnormal; Notable for the following:    WBC 11.7 (*)    RBC 3.73 (*)    All other components within normal limits  PROTIME-INR - Abnormal; Notable for the following:    Prothrombin Time 28.6 (*)    INR 2.74 (*)    All other components within normal limits  I-STAT TROPOININ, ED    Imaging Review Dg Chest 2 View  09/13/2014   CLINICAL DATA:  Three-day history of chest pain. Shortness of breath.  EXAM: CHEST  2 VIEW  COMPARISON:  Chest CT August 04, 2006  FINDINGS: Lungs are clear. Heart size and pulmonary vascularity are normal. No adenopathy. No pneumothorax. No bone lesions. There is slight lower thoracic levoscoliosis.  IMPRESSION: No edema or consolidation.   Electronically Signed   By: Lowella Grip III M.D.   On: 09/13/2014 12:04   I have personally reviewed and evaluated these images and lab results as part of my medical decision-making.   EKG Interpretation   Date/Time:  Tuesday September 13 2014 10:37:47 EDT Ventricular Rate:  74 PR Interval:  153 QRS Duration: 112 QT Interval:  417 QTC Calculation: 463 R Axis:   18 Text Interpretation:  Sinus rhythm borderline left atrial enlargement  Borderline intraventricular conduction delay No significant change since  last tracing Confirmed by Olis Viverette MD, Halena Mohar ([id number]) on 09/13/2014 10:45:06  AM      MDM   Final diagnoses:  Midline back pain, unspecified location  Left sided chest pain    54 year old feel presents with back pain that then she states transition to chest  pain over the last 3 days. She states it is "behind by heart". She was concerned because she did not take her Coumadin recently and this may represent a new ulnar embolism. She has no tachycardia, no hypoxemia, no leg swelling or other signs of DVT currently. Her INR is therapeutic so I feel that acute pulmonary blows and is highly unlikely at this time. Her symptoms are very atypical for ACS, are not exertional, and her troponin is negative after 3 days of constant ongoing pain so at this time she is appropriate for outpatient follow-up with her primary care physician. Plan to follow up with PCP as needed and return precautions discussed for worsening  or new concerning symptoms.     Leo Grosser, MD 09/13/14 [phone]

## 2014-09-13 NOTE — ED Notes (Signed)
Pt missed 5 days coumadin - supposed to take every day. Pt began having lower back pain after 2 days, now has pain in heart. C/o shortness of breath and feels like heart is racing. Hx blood clots.

## 2014-09-13 NOTE — ED Notes (Signed)
Pt sts left sided CP and SOB x 3 days; pt sts feels palpitations

## 2014-09-26 ENCOUNTER — Other Ambulatory Visit: Payer: Self-pay

## 2014-09-26 DIAGNOSIS — B182 Chronic viral hepatitis C: Secondary | ICD-10-CM

## 2014-09-26 LAB — CBC WITH DIFFERENTIAL/PLATELET
BASOS ABS: 0.1 10*3/uL (ref 0.0–0.1)
Basophils Relative: 1 % (ref 0–1)
EOS ABS: 0.3 10*3/uL (ref 0.0–0.7)
EOS PCT: 2 % (ref 0–5)
HCT: 36.5 % (ref 36.0–46.0)
Hemoglobin: 11.9 g/dL — ABNORMAL LOW (ref 12.0–15.0)
LYMPHS ABS: 4.4 10*3/uL — AB (ref 0.7–4.0)
Lymphocytes Relative: 34 % (ref 12–46)
MCH: 32.5 pg (ref 26.0–34.0)
MCHC: 32.6 g/dL (ref 30.0–36.0)
MCV: 99.7 fL (ref 78.0–100.0)
MONO ABS: 1.3 10*3/uL — AB (ref 0.1–1.0)
MPV: 9.8 fL (ref 8.6–12.4)
Monocytes Relative: 10 % (ref 3–12)
Neutro Abs: 6.9 10*3/uL (ref 1.7–7.7)
Neutrophils Relative %: 53 % (ref 43–77)
PLATELETS: 463 10*3/uL — AB (ref 150–400)
RBC: 3.66 MIL/uL — ABNORMAL LOW (ref 3.87–5.11)
RDW: 13.4 % (ref 11.5–15.5)
WBC: 13 10*3/uL — ABNORMAL HIGH (ref 4.0–10.5)

## 2014-09-27 LAB — HEPATITIS C RNA QUANTITATIVE: HCV Quantitative: NOT DETECTED IU/mL (ref <15)

## 2014-10-03 ENCOUNTER — Ambulatory Visit: Payer: Self-pay | Admitting: Internal Medicine

## 2014-10-11 ENCOUNTER — Telehealth: Payer: Self-pay | Admitting: *Deleted

## 2014-10-11 NOTE — Telephone Encounter (Signed)
Patient called and is worried that she has not seen the doctor since February 2016. Advised her there are several missed appts and she is scheduled to see him 11/2014. She advised she has several questions about the medication and if she is taking the correct dose and would like for the doctor to call her. Advised her can have the pharmacist give her a call and if he can not answer his questions we can ask the doctor and see if he will see her sooner. The patient was fine with that and will wait for Minh to give her a call about her Sofosbuvir asap.

## 2014-10-12 NOTE — Telephone Encounter (Signed)
I told her to come back on 10/10 for a CBC. I scheduled it. Make sure that I did it correct.

## 2014-10-13 NOTE — Telephone Encounter (Signed)
Checked orders and it is in correct and the patient is on the lab schedule as well.

## 2014-10-14 ENCOUNTER — Ambulatory Visit: Payer: Self-pay

## 2014-10-24 ENCOUNTER — Other Ambulatory Visit: Payer: Self-pay

## 2014-10-24 DIAGNOSIS — B182 Chronic viral hepatitis C: Secondary | ICD-10-CM

## 2014-11-22 ENCOUNTER — Ambulatory Visit (INDEPENDENT_AMBULATORY_CARE_PROVIDER_SITE_OTHER): Payer: Self-pay | Admitting: Internal Medicine

## 2014-11-22 ENCOUNTER — Encounter: Payer: Self-pay | Admitting: Internal Medicine

## 2014-11-22 VITALS — BP 153/90 | HR 70 | Temp 98.2°F | Ht 65.0 in | Wt 144.1 lb

## 2014-11-22 DIAGNOSIS — F411 Generalized anxiety disorder: Secondary | ICD-10-CM

## 2014-11-22 DIAGNOSIS — F329 Major depressive disorder, single episode, unspecified: Secondary | ICD-10-CM

## 2014-11-22 DIAGNOSIS — F1010 Alcohol abuse, uncomplicated: Secondary | ICD-10-CM

## 2014-11-22 DIAGNOSIS — F32A Depression, unspecified: Secondary | ICD-10-CM

## 2014-11-22 DIAGNOSIS — I10 Essential (primary) hypertension: Secondary | ICD-10-CM

## 2014-11-22 DIAGNOSIS — K7460 Unspecified cirrhosis of liver: Secondary | ICD-10-CM

## 2014-11-22 DIAGNOSIS — E785 Hyperlipidemia, unspecified: Secondary | ICD-10-CM

## 2014-11-22 DIAGNOSIS — B182 Chronic viral hepatitis C: Secondary | ICD-10-CM

## 2014-11-22 DIAGNOSIS — Z7901 Long term (current) use of anticoagulants: Secondary | ICD-10-CM

## 2014-11-22 LAB — POCT INR: INR: 1.9

## 2014-11-22 MED ORDER — LISINOPRIL 20 MG PO TABS: 20.0000 mg | ORAL_TABLET | Freq: Every day | ORAL | Status: DC

## 2014-11-22 MED ORDER — CLONAZEPAM 0.5 MG PO TABS: 0.5000 mg | ORAL_TABLET | Freq: Two times a day (BID) | ORAL | Status: DC | PRN

## 2014-11-22 MED ORDER — AMLODIPINE BESYLATE 5 MG PO TABS: 5.0000 mg | ORAL_TABLET | Freq: Every day | ORAL | Status: DC

## 2014-11-22 MED ORDER — HYDROCHLOROTHIAZIDE 12.5 MG PO CAPS: 25.0000 mg | ORAL_CAPSULE | Freq: Every day | ORAL | Status: DC

## 2014-11-22 NOTE — Progress Notes (Signed)
Patient reports good adherence, no signs or symptoms of bleeding or thromboembolism. INR 1.9 today. Since this is the first time seeing patient in ~2 years for INR, will continue dose and have patient schedule follow-up INR appointment for Monday, 11/28/14.

## 2014-11-22 NOTE — Assessment & Plan Note (Signed)
Pt says that she was started on Klonopin 2 years ago and since then she has been taking it twice a day. She had almost stopped taking it near 2022-07-14, but since the death of her daughter, she has found it necessary to restart the kloponin. She has been without it for the last 2 months. It helps her sleep 8 hours at night, and also it helps with the mood as she does not feel the anger and says the 0.5 mg works well for her.  Pt currently sleeps 5 hours at night and says she has trouble staying alseep. She has been able to cope after the death in the family and is able to do her ADLs.   To me, her symptoms of decreased sleep and decreased energy and wanting to go to bed in the evening sounds more of depression than anxiety, and I offered her to start the SSRI- fluoxetine, but patient declined to use it and has declined in the past as she says the SSRIs make her very drowsy.    -refilled Klonopin -Offered the resource [facility] for counseling

## 2014-11-22 NOTE — Assessment & Plan Note (Signed)
Pt says that she had quit alcohol when she had started the Hep C treatment, but since her daughter's death has relapsed, and currently she drinks 2-3 beers a day of 8-12 oz size cup.   A/P alcohol use disorder -Counseled on the importance of quitting the alcohol, and went over her last liver ultrasound and MRI -repeat U/s for surveillance

## 2014-11-22 NOTE — Assessment & Plan Note (Signed)
Her abd ultrasound showed hypoechoic lesions in the liver and repeat Abd MRI showed 5 mm lesion and evidence of cirrhosis. Pt did not have 6 monthly ultrasound ordered previously  Plan- ordered ultrasound of the abd/liver.

## 2014-11-22 NOTE — Patient Instructions (Signed)
Please start taking the amlodipine 5 mg daily Please do the ultrasound of your liver- they will call you to make appt Please work on reducing your alcohol intake  Please follow up with Dr Elie Confer for your INR Please follow up with me in 3 months

## 2014-11-22 NOTE — Assessment & Plan Note (Signed)
Lipid Panel     Component Value Date/Time   CHOL 213* 11/24/2013 1021   TRIG 167* 11/24/2013 1021   HDL 54 11/24/2013 1021   CHOLHDL 3.9 11/24/2013 1021   VLDL 33 11/24/2013 1021   LDLCALC 126* 11/24/2013 1021   LDLDIRECT 135* 12/16/2012 1411    Pt had elevated LDL of 135 last November. She recently quit smoking 3 weeks back. Her ASCVD score is 3.9 as a nonsmoker, and 9.6 as a smoker. We will defer the decision to start the statin therapy at the next visit depending on her smoking status that time.

## 2014-11-22 NOTE — Progress Notes (Signed)
Patient ID: Sarah Simon, female   DOB: 12/11/1960, 54 y.o.   MRN: [id number]    Subjective:   Patient ID: Sarah Simon female   DOB: 07-06-1960 54 y.o.   MRN: [id number]  HPI: [name] is a 54 y.o. woman who is here for follow up. Please see PMH for the list of the medical problems.  Please see problem list for the status of the patient's chronic medical problems.    Past Medical History  Diagnosis Date  . Pulmonary embolism [facility]) May 2003     On Lifelong coumidin Factor V leiden def  . Factor V Leiden mutation ([place])   . Hepatitis C carrier   . Subarachnoid hemorrhage [facility])     Required surgery for clipping   . Suicide attempt ([place])   . Melanoma ([place])     unclear diagnosis, this is per pt report, no treatment or follow up known. per pt on left leg  . Skin cancer     multiple chest and back    Current Outpatient Prescriptions  Medication Sig Dispense Refill  . hydrochlorothiazide (MICROZIDE) 12.5 MG capsule Take 2 capsules (25 mg total) by mouth daily. 90 capsule 3  . ibuprofen (ADVIL) 200 MG tablet Take 200 mg by mouth every 12 (twelve) hours as needed for headache or moderate pain.     Marland Kitchen lisinopril (PRINIVIL,ZESTRIL) 20 MG tablet Take 1 tablet (20 mg total) by mouth daily. 90 tablet 3  . ribavirin (COPEGUS) 200 MG tablet 600 mg in am and 400 mg in pm 140 tablet 5  . Sofosbuvir 400 MG TABS Take 400 mg by mouth daily. 28 tablet 5  . warfarin (COUMADIN) 2 MG tablet Take 1/2 tablet on Mondays/Tuesdays/Wednesdays/Fridays; Take 1 tablet all OTHER days. 35 tablet 5  . amLODipine (NORVASC) 5 MG tablet Take 1 tablet (5 mg total) by mouth daily. 30 tablet 11  . clonazePAM (KLONOPIN) 0.5 MG tablet Take 1 tablet (0.5 mg total) by mouth 2 (two) times daily as needed for anxiety. 60 tablet 3   No current facility-administered medications for this visit.   Family History  Problem Relation Age of Onset  . Stroke Mother 61    brain aneurysm  . Heart disease  Father 21   Social History   Social History  . Marital Status: Married    Spouse Name: N/A  . Number of Children: N/A  . Years of Education: N/A   Social History Main Topics  . Smoking status: Former Smoker    Types: Cigarettes    Quit date: 04/23/1970  . Smokeless tobacco: None     Comment: stopped 3 weeks ago  . Alcohol Use: 20.4 oz/week    10 Cans of beer, 24 Standard drinks or equivalent per week     Comment: Beer occasional  . Drug Use: No  . Sexual Activity: Not Asked     Comment: lives with husband and two kids, no job, pays out of pocket, was trying for medicaid   Other Topics Concern  . None   Social History Narrative   Review of Systems: Review of Systems  Constitutional: Negative.  Negative for fever, chills and malaise/fatigue.  HENT: Negative.   Eyes: Negative.   Cardiovascular: Negative for chest pain, palpitations, orthopnea and claudication.  Gastrointestinal: Negative.  Negative for heartburn, nausea, vomiting, abdominal pain, diarrhea and constipation.  Skin: Negative.   Neurological: Negative.   Psychiatric/Behavioral: Positive for depression and substance abuse. Negative for suicidal ideas and hallucinations.  The patient has insomnia.     Objective:  Physical Exam: Filed Vitals:   11/22/14 0900 11/22/14 0955  BP: 157/85 153/90  Pulse: 66 70  Temp: 98.2 F (36.8 C)   TempSrc: Oral   Height: 5\' 5"  (1.651 m)   Weight: 144 lb 1.6 oz (65.363 kg)   SpO2: 99%    Physical Exam  Constitutional: She is oriented to person, place, and time. She appears well-developed and well-nourished.  HENT:  Head: Normocephalic and atraumatic.  Eyes: EOM are normal. Pupils are equal, round, and reactive to light.  Neck: Normal range of motion. Neck supple.  Cardiovascular: Normal rate, regular rhythm and normal heart sounds.   Pulmonary/Chest: Effort normal and breath sounds normal. No respiratory distress. She has no wheezes.  Abdominal: Soft. Bowel sounds are  normal. She exhibits no distension and no mass. There is no tenderness.  Neurological: She is alert and oriented to person, place, and time.  Skin: Skin is warm and dry.  Psychiatric: She has a normal mood and affect. Her behavior is normal.    Assessment & Plan:   Please see problem based charting for assessment and plan

## 2014-11-22 NOTE — Assessment & Plan Note (Signed)
Pt says that she had  A PE in 2001 and since then she has been on daily coumadin, and the dose is described in the medication list. Pt is compliant with the coumadin and takes every day. However she has not followed up in the clinic since > 6 months. Also, apparently, she was also found to have factor V leiden mutation in the remote past, and I am unable to find it currently. Pt is also not interested in NOAC  Plan: Her INR was 1.9 today Asked the patient to see Dr Maudie Mercury and her coumadin was adjusted and to follow up monthly. -I have explained to the patient that she needs to follow up in the coumadin clinic every month, and I won't be able to refill her warfarin if this does not happen, and explained her the risks of not monitoring the INR regularly. I am not sure if she needs a lifelong coumadin therapy, since her first episode was in 2003 and it was a provoked episode and she has not had a recurrence of PE/DVT since then. However, due to the factor V mutation, the plan is unclear, and so will search her records to get the info on Factor V if not available, then can reorder.  -F/u in 3 months

## 2014-11-22 NOTE — Assessment & Plan Note (Signed)
Pt is on the last week of the 6 month long treatment of Sovaldi and ribavirin. Pt has been able to tolerate that well. She still continued to consume 2-3 8 oz glasses of alcohol. She has an appt with ID on Nov 20th.  Plan: F/u with ID as scheduled

## 2014-11-22 NOTE — Assessment & Plan Note (Signed)
BP Readings from Last 3 Encounters:  11/22/14 153/90  09/13/14 145/84  04/27/14 130/80    Lab Results  Component Value Date   NA 139 09/13/2014   K 3.3* 09/13/2014   CREATININE 0.56 09/13/2014    Assessment: Blood pressure control:  not at goal Progress toward BP goal:   not at goal l Comments: Pt's repeat BP was still elevated. Pt says that she takes her meds everyday and does not miss any doses- took her meds this morning  Plan: Medications:  Continue hctz 25 mg daily, lisinopril 20 mg daily, and added amlodipine 5 mg daily RTC in 3 months

## 2014-11-23 NOTE — Progress Notes (Signed)
Internal Medicine Clinic Attending  I saw and evaluated the patient.  I personally confirmed the key portions of the history and exam documented by Dr. Saraiya and I reviewed pertinent patient test results.  The assessment, diagnosis, and plan were formulated together and I agree with the documentation in the resident's note.  

## 2014-11-28 ENCOUNTER — Ambulatory Visit: Payer: Self-pay

## 2014-12-02 ENCOUNTER — Ambulatory Visit (HOSPITAL_COMMUNITY): Payer: Self-pay

## 2014-12-06 ENCOUNTER — Encounter: Payer: Self-pay | Admitting: Student

## 2014-12-06 ENCOUNTER — Ambulatory Visit: Payer: Self-pay | Admitting: Internal Medicine

## 2014-12-23 ENCOUNTER — Ambulatory Visit (HOSPITAL_COMMUNITY): Payer: Self-pay

## 2015-02-27 ENCOUNTER — Encounter: Payer: Self-pay | Admitting: Internal Medicine

## 2015-03-16 ENCOUNTER — Other Ambulatory Visit: Payer: Self-pay | Admitting: *Deleted

## 2015-03-16 ENCOUNTER — Ambulatory Visit (INDEPENDENT_AMBULATORY_CARE_PROVIDER_SITE_OTHER): Payer: Self-pay | Admitting: Internal Medicine

## 2015-03-16 ENCOUNTER — Encounter: Payer: Self-pay | Admitting: Internal Medicine

## 2015-03-16 VITALS — BP 163/80 | HR 65 | Temp 98.1°F | Resp 18 | Ht 67.0 in | Wt 143.1 lb

## 2015-03-16 DIAGNOSIS — Z86711 Personal history of pulmonary embolism: Secondary | ICD-10-CM

## 2015-03-16 DIAGNOSIS — Z86718 Personal history of other venous thrombosis and embolism: Secondary | ICD-10-CM

## 2015-03-16 DIAGNOSIS — Z7901 Long term (current) use of anticoagulants: Secondary | ICD-10-CM

## 2015-03-16 DIAGNOSIS — F411 Generalized anxiety disorder: Secondary | ICD-10-CM

## 2015-03-16 DIAGNOSIS — R21 Rash and other nonspecific skin eruption: Secondary | ICD-10-CM | POA: Insufficient documentation

## 2015-03-16 LAB — POCT INR: INR: 1.4

## 2015-03-16 MED ORDER — TRIAMCINOLONE ACETONIDE 0.025 % EX OINT: 1.0000 "application " | TOPICAL_OINTMENT | Freq: Two times a day (BID) | CUTANEOUS | Status: DC

## 2015-03-16 MED ORDER — KETOCONAZOLE 2 % EX CREA: 1.0000 "application " | TOPICAL_CREAM | Freq: Every day | CUTANEOUS | Status: DC

## 2015-03-16 MED ORDER — KETOCONAZOLE 2 % EX SHAM: 1.0000 "application " | MEDICATED_SHAMPOO | CUTANEOUS | Status: DC

## 2015-03-16 MED ORDER — CLONAZEPAM 0.5 MG PO TABS: 0.5000 mg | ORAL_TABLET | Freq: Two times a day (BID) | ORAL | Status: DC | PRN

## 2015-03-16 NOTE — Progress Notes (Signed)
   Subjective:    Patient ID: Sarah Simon, female    DOB: [birth year], 55 y.o.   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   Rash started 3 months ago after finishing her Hep C treatment with sofosbuvir + ribavirin. Initially started on her forehead, then spread to her scalp and also her face, affecting her face, eye brow, nose, and also forehead. Has had flaky texture since it started. It's itchy. Has tried some over the counter shampoo and cream (doesn't know what type or what's the name) without any improvement. Denies any new environmental changes, new diet, or new medications. Denies fever/chills or other symptoms.  Denies joint pain or swelling. Denies personal or family hx of autoimmune diseases.    Chronic anticoag: follows up with Dr. Elie Confer regarding this. Wants [place] to check her INR today. Has been on stable coumadin regimen for many years now. Denies any bleeding.  Review of Systems  Constitutional: Negative for fever, chills and fatigue.  HENT: Negative for congestion, rhinorrhea and sore throat.   Eyes: Negative for photophobia and visual disturbance.  Respiratory: Negative for chest tightness, shortness of breath and wheezing.   Cardiovascular: Negative for chest pain, palpitations and leg swelling.  Gastrointestinal: Negative for abdominal pain and abdominal distention.  Endocrine: Negative for polyuria.  Genitourinary: Negative for dysuria.  Musculoskeletal: Negative for back pain and arthralgias.  Skin: Positive for rash.  Allergic/Immunologic: Negative.   Neurological: Negative.   Hematological: Negative.   Psychiatric/Behavioral: Negative.        Objective:   Physical Exam  Constitutional: She is oriented to person, place, and time. She appears well-developed and well-nourished. No distress.    HENT:  Nose: Nose normal.  Mouth/Throat: Oropharynx is clear and moist. No oropharyngeal exudate.  Has surgical scar over her left scalp.  Eyes: Conjunctivae are normal. Pupils are equal, round, and reactive to light.  Neck: Normal range of motion.  Cardiovascular: Normal rate and regular rhythm.  Exam reveals no friction rub.   No murmur heard. Pulmonary/Chest: Effort normal and breath sounds normal. No respiratory distress. She has no wheezes.  Abdominal: Soft. Bowel sounds are normal. She exhibits no distension. There is no tenderness.  Musculoskeletal: Normal range of motion. She exhibits no edema or tenderness.  Neurological: She is alert and oriented to person, place, and time.  Skin: She is not diaphoretic.  Has diffuse erythematous, patchy areas with yellowish scales on her forehead, eye brows, sparing the eye lids, on her both cheeks and nose, also few lesion on the nasolabial folds.  Has similar lesions on her scalp diffusely.      Filed Vitals:   03/16/15 0853  BP: 163/80  Pulse: 65  Temp: 98.1 F (36.7 C)  Resp: 18        Assessment & Plan:  See problem based a&p.

## 2015-03-16 NOTE — Patient Instructions (Signed)
Your rash appears to be seborrheic dermatitis. We will start treating it using few medications.  Use the shampoo 4 times a week on your scalp. Leave on 10 mins then wash your scalp.  Use the kenalog cream on your face along with the antifungal cream.  Use this until your rash completely resolves. Follow up in 4 weeks to check on this again.  We will check your INR again today.

## 2015-03-16 NOTE — Assessment & Plan Note (Addendum)
Last INR check was 11/2014. Usually f/up with Dr. Elie Confer but she hasn't made an appt to see him yet.  Checked INR today: 1.4. Denies any new medication, denies skipping dose.  Will get Dr. Maudie Mercury to help [place] out with dosing. Dr. Maudie Mercury recommendation: Weekly dose was increased by ~10% to 9mg  per week.  F/up for INR check in 2 weeks.

## 2015-03-16 NOTE — Assessment & Plan Note (Signed)
Rash appears to be seborrheic dermatitis of scalp and face.  We will treat with 2% ketoconazole shampoo 4x week for her scalp. And for her face we will use 2% ketoconazole cream + kenalog 0.025%.  F/up in 4 weeks for rash.

## 2015-03-16 NOTE — Telephone Encounter (Signed)
Called to pharm charsetta pt needs appt

## 2015-03-16 NOTE — Progress Notes (Signed)
Anticoagulation Management Aniyiah A Vierling is a 55 y.o. female seen subsequent to office visit with Dr. Genene Churn for warfarin management.    Indication: DVT and factor V Leiden deficiency, PE Duration: indefinite  Anticoagulation Clinic Visit History: Patient does not report signs/symptoms of bleeding or thromboembolism. No recent changes to diet or medications.  Anticoagulation Episode Summary    Current INR goal 2.0-3.0  Next INR check 01/04/2013  INR from last check 2.70 (11/30/2012)  Most recent INR 1.4! (03/16/2015)  Weekly max dose   Target end date Indefinite  [facility] check location [facility]  Preferred lab   Send INR reminders to [name]   Indications  History of pulmonary embolism (Resolved) [Z86.711] DVT (Resolved) [I82.409] Lifelong Coumadin therapy [Z79.01]        Comments       Anticoagulation Care Providers    Provider Role Specialty Phone number   Bartholomew Crews, MD  Internal Medicine [phone]     ASSESSMENT Recent Results: The most recent result is correlated with 8 mg per week: Lab Results  Component Value Date   INR 1.4 03/16/2015   INR 1.9 11/22/2014   INR 2.74* 09/13/2014    Anticoagulation Dosing: INR as of 11/30/2012 and Previous Dosing Information    INR Dt INR Goal [employer] Sun Mon Tue Wed Thu Fri Sat   11/30/2012 2.70 2.0-3.0 10 mg 2 mg 1 mg 1 mg 1 mg 2 mg 1 mg 2 mg    Anticoagulation Dose Instructions as of 11/30/2012      Total Sun Mon Tue Wed Thu Fri Sat   New Dose 10 mg 2 mg 1 mg 1 mg 1 mg 2 mg 1 mg 2 mg     (2 mg x 1)  (2 mg x 0.5)  (2 mg x 0.5)  (2 mg x 0.5)  (2 mg x 1)  (2 mg x 0.5)  (2 mg x 1)                           INR today: Subtherapeutic  PLAN Weekly dose was increased by ~10% to 9mg  per week.   Patient advised to contact clinic or seek medical attention if signs/symptoms of bleeding or thromboembolism occur.  Patient verbalized understanding by repeating back information and was advised to  contact me if further medication-related questions arise. Patient was also provided an information handout.  Follow-up Follow up in 2 weeks for INR check.  Judieth Keens, PharmD. Clinical Pharmacy Resident  10 minutes spent face-to-face with the patient during the encounter.

## 2015-03-17 LAB — HIV ANTIBODY (ROUTINE TESTING W REFLEX): HIV SCREEN 4TH GENERATION: NONREACTIVE

## 2015-03-17 NOTE — Progress Notes (Signed)
Medicine attending: Medical history, presenting problems, physical findings, and medications, reviewed with resident physician Dr Tasrif Ahmed on the day of the patient visit and I concur with his evaluation and management plan. 

## 2015-04-12 ENCOUNTER — Encounter: Payer: Self-pay | Admitting: Internal Medicine

## 2015-04-12 ENCOUNTER — Ambulatory Visit: Payer: Self-pay | Admitting: Internal Medicine

## 2015-04-26 ENCOUNTER — Telehealth: Payer: Self-pay | Admitting: Internal Medicine

## 2015-04-26 NOTE — Telephone Encounter (Signed)
APPT. REMINDER CALL, LMTCB °

## 2015-05-01 ENCOUNTER — Encounter: Payer: Self-pay | Admitting: Internal Medicine

## 2015-09-01 ENCOUNTER — Telehealth: Payer: Self-pay | Admitting: Internal Medicine

## 2015-09-01 NOTE — Telephone Encounter (Signed)
APT. REMINDER CALL, LMTCB °

## 2015-09-04 ENCOUNTER — Encounter: Payer: Self-pay | Admitting: Internal Medicine

## 2015-09-05 ENCOUNTER — Other Ambulatory Visit: Payer: Self-pay

## 2015-09-05 NOTE — Telephone Encounter (Signed)
Coumadin to be filled @ [facility] on [address].

## 2015-09-06 MED ORDER — WARFARIN SODIUM 2 MG PO TABS: ORAL_TABLET | ORAL | 0 refills | Status: DC

## 2015-09-15 ENCOUNTER — Other Ambulatory Visit: Payer: Self-pay | Admitting: Internal Medicine

## 2015-09-15 DIAGNOSIS — F411 Generalized anxiety disorder: Secondary | ICD-10-CM

## 2015-09-15 NOTE — Telephone Encounter (Signed)
Pt would like a refill on her Klonopin.  Pt states she has an appt on 11/13/2015 with her PCP

## 2015-09-19 NOTE — Telephone Encounter (Signed)
Patient missed previous 2 appointments with me. I can't refill her klonopin. She needs to see me first.

## 2015-09-20 NOTE — Telephone Encounter (Signed)
Pt called / informed of refill denial.

## 2015-10-02 ENCOUNTER — Other Ambulatory Visit: Payer: Self-pay | Admitting: Internal Medicine

## 2015-10-02 DIAGNOSIS — I10 Essential (primary) hypertension: Secondary | ICD-10-CM

## 2015-11-13 ENCOUNTER — Encounter: Payer: Self-pay | Admitting: Internal Medicine

## 2015-11-13 ENCOUNTER — Ambulatory Visit (INDEPENDENT_AMBULATORY_CARE_PROVIDER_SITE_OTHER): Payer: Self-pay | Admitting: Pharmacist

## 2015-11-13 ENCOUNTER — Ambulatory Visit (INDEPENDENT_AMBULATORY_CARE_PROVIDER_SITE_OTHER): Payer: Self-pay | Admitting: Internal Medicine

## 2015-11-13 VITALS — BP 170/95 | HR 62 | Temp 97.9°F | Ht 67.5 in | Wt 148.6 lb

## 2015-11-13 DIAGNOSIS — Z87891 Personal history of nicotine dependence: Secondary | ICD-10-CM

## 2015-11-13 DIAGNOSIS — I10 Essential (primary) hypertension: Secondary | ICD-10-CM

## 2015-11-13 DIAGNOSIS — R21 Rash and other nonspecific skin eruption: Secondary | ICD-10-CM

## 2015-11-13 DIAGNOSIS — Z7901 Long term (current) use of anticoagulants: Secondary | ICD-10-CM

## 2015-11-13 DIAGNOSIS — Z86711 Personal history of pulmonary embolism: Secondary | ICD-10-CM

## 2015-11-13 DIAGNOSIS — Z79899 Other long term (current) drug therapy: Secondary | ICD-10-CM

## 2015-11-13 DIAGNOSIS — F411 Generalized anxiety disorder: Secondary | ICD-10-CM

## 2015-11-13 DIAGNOSIS — Z86718 Personal history of other venous thrombosis and embolism: Secondary | ICD-10-CM

## 2015-11-13 DIAGNOSIS — Z5181 Encounter for therapeutic drug level monitoring: Secondary | ICD-10-CM

## 2015-11-13 LAB — POCT INR: INR: 1.8

## 2015-11-13 MED ORDER — KETOCONAZOLE 2 % EX SHAM: 1.0000 "application " | MEDICATED_SHAMPOO | CUTANEOUS | 2 refills | Status: DC

## 2015-11-13 MED ORDER — LISINOPRIL-HYDROCHLOROTHIAZIDE 20-25 MG PO TABS: 1.0000 | ORAL_TABLET | Freq: Every day | ORAL | 3 refills | Status: DC

## 2015-11-13 MED ORDER — WARFARIN SODIUM 2 MG PO TABS: ORAL_TABLET | ORAL | 0 refills | Status: DC

## 2015-11-13 NOTE — Progress Notes (Signed)
Anti-Coagulation Progress Note  Sarah Simon is a 56 y.o. female who is currently on an anti-coagulation regimen.    RECENT RESULTS: Recent results are below, the most recent result is correlated with a dose of 10 mg. per week: Lab Results  Component Value Date   INR 1.80 11/13/2015   INR 1.4 03/16/2015   INR 1.9 11/22/2014    ANTI-COAG DOSE: Anticoagulation Dose Instructions as of 11/13/2015      Dorene Grebe Tue Wed Thu Fri Sat   New Dose 1 mg 2 mg 2 mg 2 mg 2 mg 2 mg 1 mg    Description   Take 1 tablet on all days--EXCEPT Sundays and Saturdays--take only 1/2 tablet on Sundays and Saturdays.       ANTICOAG SUMMARY: Anticoagulation Episode Summary    Current INR goal:   2.0-3.0  TTR:   52.3 % (5.7 y)  Next INR check:   12/11/2015  INR from last check:   1.80! (11/13/2015)  Weekly max dose:     Target end date:   Indefinite  INR check location[facility]   [facility]  Preferred lab:     Send INR reminders to:   [name]   Indications   History of pulmonary embolism (Resolved) [Z86.711] DVT (Resolved) [I82.409] Lifelong Coumadin therapy [Z79.01]       Comments:         Anticoagulation Care Providers    Provider Role Specialty Phone number   Bartholomew Crews, MD  Internal Medicine [phone]      ANTICOAG TODAY: Anticoagulation Summary  As of 11/13/2015   INR goal:   2.0-3.0  TTR:     Today's INR:   1.80!  Next INR check:   12/11/2015  Target end date:   Indefinite   Indications   History of pulmonary embolism (Resolved) [Z86.711] DVT (Resolved) [I82.409] Lifelong Coumadin therapy [Z79.01]        Anticoagulation Episode Summary    INR check location:   [facility]   Preferred lab:      Send INR reminders to:   [name]   Comments:       Anticoagulation Care Providers    Provider Role Specialty Phone number   Bartholomew Crews, MD  Internal Medicine [phone]      PATIENT INSTRUCTIONS: Patient instructed to take medications  as defined in the Anti-coagulation Track section of this encounter.  Patient instructed to take 1 tablet Monday - Friday; on Saturdays and Sundays, take only 1/2 tablet.  Patient instructed to tak3 today's dose.  Patient verbalized understanding of these instructions.      FOLLOW-UP Return in 4 weeks (on 12/11/2015) for Follow up INR at [place], III Pharm.D., CACP

## 2015-11-13 NOTE — Assessment & Plan Note (Signed)
Likely has seborrheic dermatitis of your scalp, had it on her face too which got better with ketoconazole. Could not afford the shampoo before, wants another script which I gave today to try.

## 2015-11-13 NOTE — Assessment & Plan Note (Signed)
No INR check since march. Will check INR today.

## 2015-11-13 NOTE — Patient Instructions (Signed)
Will check your blood work today.  Gave you lisinopril-hctz 20-25mg  combo pill for your blood pressure. Come back in 4 weeks for follow up on your blood pressure.  See psychiatrist for your anxiety as we discussed. Unfortunately, I cannot give you klonopin.  Follow up at the coumadin clinic.

## 2015-11-13 NOTE — Progress Notes (Signed)
   CC: HTN follow up  HPI:  [name] is a 55 y.o. with hx of HTN, and other problems as listed below is here for HTN follow up.  Past Medical History:  Diagnosis Date  . Factor V Leiden mutation ([place])   . Hepatitis C carrier ([place])   . Melanoma ([place])    unclear diagnosis, this is per pt report, no treatment or follow up known. per pt on left leg  . Pulmonary embolism [facility]) May 2003    On Lifelong coumidin Factor V leiden def  . Skin cancer    multiple chest and back   . Subarachnoid hemorrhage [facility])    Required surgery for clipping   . Suicide attempt    HTN: ran out of meds for 6 weeks. On amlodipine 5mg  daily along with hctz 12.5mg  daily and lisinopril 20mg  daily. BP elevated 170/95.   Has been out of klonopin for 2 months. Had some irritation due to klonopin. Has not been on zoloft. Is afraid of zoloft thinking it's only for crazy people.  Has seborrhoic dermatitis of the scalp and could not afford the ketoconazole shampoo before. Would ike another prescription.    Health maintenance: due for pap smear, colonscopy, mammogram, and flu shot (declined).  Wants to do the pap smear and the mammogram through another doctor.   Review of Systems  Constitutional: Negative for chills and fever.  Cardiovascular: Negative for chest pain.  Gastrointestinal: Negative for heartburn, nausea and vomiting.  Skin:       Itchy scalp  Neurological: Negative for headaches.  Psychiatric/Behavioral: Negative for depression and suicidal ideas. The patient is nervous/anxious.     Physical Exam  Constitutional: She is oriented to person, place, and time. She appears well-developed and well-nourished. No distress.  HENT:  Head: Normocephalic and atraumatic.  Eyes: Conjunctivae are normal.  Cardiovascular: Exam reveals no gallop and no friction rub.   No murmur heard. Respiratory: Effort normal and breath sounds normal. No respiratory distress. She has no wheezes.  GI: Soft. Bowel  sounds are normal. She exhibits no distension. There is no tenderness.  Musculoskeletal: Normal range of motion. She exhibits no edema or deformity.  Neurological: She is alert and oriented to person, place, and time.  Skin: Skin is warm and dry. She is not diaphoretic.  Scalp is dry, no inflammation.   Psychiatric: She has a normal mood and affect.    Vitals:   11/13/15 1324  BP: (!) 170/95  Pulse: 62  Temp: 97.9 F (36.6 C)  TempSrc: Oral  SpO2: 98%  Weight: 148 lb 9.6 oz (67.4 kg)  Height: 5' 7.5" (1.715 m)    Assessment & Plan:   See Encounters Tab for problem based charting.  Patient discussed with Dr. Evette Doffing

## 2015-11-13 NOTE — Assessment & Plan Note (Addendum)
Discussed that it's unsafe to take klonopin long term. She is not willing to go off klonopin. Does not want to try zoloft or other SSRI or other meds.  Wants second opinion. Will refer to [facility].

## 2015-11-13 NOTE — Patient Instructions (Signed)
Patient instructed to take medications as defined in the Anti-coagulation Track section of this encounter.  Patient instructed to take 1 tablet Monday - Friday; on Saturdays and Sundays, take only 1/2 tablet.  Patient instructed to tak3 today's dose.  Patient verbalized understanding of these instructions.

## 2015-11-13 NOTE — Assessment & Plan Note (Addendum)
Currently off all her BP meds. Was on hctz 12.5mg  daily + lisinopril 20mg  daily + amlodipine 5mg  daily.  Will put her back on Lisinopril-hctz 20mg -25mg  daily. May not beed the amlodipine.  Check bmet today.  Will re-assess in 4 weeks.

## 2015-11-14 ENCOUNTER — Encounter: Payer: Self-pay | Admitting: Internal Medicine

## 2015-11-14 LAB — BASIC METABOLIC PANEL
BUN / CREAT RATIO: 26 — AB (ref 9–23)
BUN: 13 mg/dL (ref 6–24)
CO2: 26 mmol/L (ref 18–29)
CREATININE: 0.5 mg/dL — AB (ref 0.57–1.00)
Calcium: 9.5 mg/dL (ref 8.7–10.2)
Chloride: 100 mmol/L (ref 96–106)
GFR, EST AFRICAN AMERICAN: 126 mL/min/{1.73_m2} (ref >59)
GFR, EST NON AFRICAN AMERICAN: 109 mL/min/{1.73_m2} (ref >59)
Glucose: 106 mg/dL — ABNORMAL HIGH (ref 65–99)
Potassium: 4.1 mmol/L (ref 3.5–5.2)
Sodium: 141 mmol/L (ref 134–144)

## 2015-11-14 NOTE — Progress Notes (Signed)
INTERNAL MEDICINE TEACHING ATTENDING ADDENDUM - Lucious Groves, DO Duration- lifelong, Indication- recurrent VTE, INR-  Lab Results  Component Value Date   INR 1.80 11/13/2015  . Agree with pharmacy recommendations as outlined in their note.

## 2015-11-15 NOTE — Progress Notes (Signed)
Internal Medicine Clinic Attending  Case discussed with Dr. Ahmed at the time of the visit.  We reviewed the resident's history and exam and pertinent patient test results.  I agree with the assessment, diagnosis, and plan of care documented in the resident's note. 

## 2015-12-11 ENCOUNTER — Ambulatory Visit (INDEPENDENT_AMBULATORY_CARE_PROVIDER_SITE_OTHER): Payer: Self-pay | Admitting: Pharmacist

## 2015-12-11 ENCOUNTER — Ambulatory Visit (INDEPENDENT_AMBULATORY_CARE_PROVIDER_SITE_OTHER): Payer: Self-pay | Admitting: Internal Medicine

## 2015-12-11 ENCOUNTER — Encounter: Payer: Self-pay | Admitting: Internal Medicine

## 2015-12-11 VITALS — BP 168/77 | HR 65 | Temp 98.0°F | Wt 150.1 lb

## 2015-12-11 DIAGNOSIS — Z86711 Personal history of pulmonary embolism: Secondary | ICD-10-CM

## 2015-12-11 DIAGNOSIS — T8149XA Infection following a procedure, other surgical site, initial encounter: Secondary | ICD-10-CM | POA: Insufficient documentation

## 2015-12-11 DIAGNOSIS — Z7901 Long term (current) use of anticoagulants: Secondary | ICD-10-CM

## 2015-12-11 DIAGNOSIS — Z85828 Personal history of other malignant neoplasm of skin: Secondary | ICD-10-CM

## 2015-12-11 DIAGNOSIS — Z79899 Other long term (current) drug therapy: Secondary | ICD-10-CM

## 2015-12-11 DIAGNOSIS — Z86718 Personal history of other venous thrombosis and embolism: Secondary | ICD-10-CM

## 2015-12-11 DIAGNOSIS — Z87891 Personal history of nicotine dependence: Secondary | ICD-10-CM

## 2015-12-11 DIAGNOSIS — I10 Essential (primary) hypertension: Secondary | ICD-10-CM

## 2015-12-11 DIAGNOSIS — L989 Disorder of the skin and subcutaneous tissue, unspecified: Secondary | ICD-10-CM | POA: Insufficient documentation

## 2015-12-11 LAB — POCT INR: INR: 2.5

## 2015-12-11 MED ORDER — OLMESARTAN-AMLODIPINE-HCTZ 40-5-25 MG PO TABS: 1.0000 | ORAL_TABLET | Freq: Once | ORAL | 1 refills | Status: DC

## 2015-12-11 MED ORDER — WARFARIN SODIUM 2 MG PO TABS: ORAL_TABLET | ORAL | 0 refills | Status: DC

## 2015-12-11 MED FILL — OLMSRTN-AMLDPN-HCTZ 40-5-25: 40-5-25 | 30 days supply | Qty: 30 | Fill #0

## 2015-12-11 MED FILL — WARFARIN SODIUM 2 MG TABLET: 2 | 30 days supply | Qty: 36 | Fill #0

## 2015-12-11 NOTE — Patient Instructions (Addendum)
Sarah Simon,  It was a pleasure meeting you today.  Please pick up your blood pressure medication at the [facility]. Please stop taking the current medication you are on. Please follow up in one month for blood pressure recheck.

## 2015-12-11 NOTE — Patient Instructions (Signed)
Patient instructed to take medications as defined in the Anti-coagulation Track section of this encounter.  Patient instructed to take today's dose.  Patient instructed to take 1 tablet on all days--EXCEPT Sundays and Saturdays--take only 1/2 tablet on Sundays and Saturdays.  Patient verbalized understanding of these instructions.

## 2015-12-11 NOTE — Progress Notes (Signed)
   CC: follow-up on un-controlled hypertension  HPI:  [name] is a 55 y.o. woman with history noted below that presents to the internal medicine clinic for follow-up on uncontrolled hypertension. She was restarted on lisinopril-hydrochlorothiazide 20 mg-25 mg daily on previous clinic visit.  She states that she has been taking one pill every day for the past 4 weeks. She denies any blurry vision or lightheadedness.  Patient also states that she has had a non-healing area on her left leg that she thinks is skin cancer.  She reports noticing the lesion 1.5 years ago.  She states it is mildly tender to the touch but does not itch.  She has not seen a dermatologist due to financial reasons.  She states she has a history of melanoma on the left lateral thigh that was removed.  She denies any drainage to the nonhealing area or growth in size.    Past Medical History:  Diagnosis Date  . Factor V Leiden mutation ([place])   . Hepatitis C carrier ([place])   . Hypertension 08/04/2006   On and off medications  Has cost problems for her medications  HTN without complications     . Melanoma ([place])    unclear diagnosis, this is per pt report, no treatment or follow up known. per pt on left leg  . Pulmonary embolism [facility]) May 2003    On Lifelong coumidin Factor V leiden def  . Skin cancer    multiple chest and back   . Subarachnoid hemorrhage [facility])    Required surgery for clipping   . Suicide attempt     Review of Systems:  Review of Systems  HENT: Negative for tinnitus.   Eyes: Negative for blurred vision.  Respiratory: Negative for shortness of breath.   Cardiovascular: Negative for chest pain.     Physical Exam:  Vitals:   12/11/15 0905  BP: (!) 168/77  Pulse: 65  Temp: 98 F (36.7 C)  TempSrc: Oral  SpO2: 98%  Weight: 150 lb 1.6 oz (68.1 kg)   Physical Exam  Constitutional: She is well-developed, well-nourished, and in no distress.  Cardiovascular: Normal rate, regular rhythm  and normal heart sounds.  Exam reveals no gallop and no friction rub.   No murmur heard. Pulmonary/Chest: Effort normal and breath sounds normal. No respiratory distress. She has no wheezes. She has no rales.  Musculoskeletal: She exhibits no edema.  Skin:  1cm round erythematous flaky region with dark center on left leg anterior leg      Assessment & Plan:   See encounters tab for problem based medical decision making.   Patient seen with Dr. Angelia Mould

## 2015-12-11 NOTE — Progress Notes (Signed)
INTERNAL MEDICINE TEACHING ATTENDING ADDENDUM - Lucious Groves, DO Duration- lifelong, Indication- recurrent VTE, INR-  Lab Results  Component Value Date   INR 2.5 12/11/2015  . Agree with pharmacy recommendations as outlined in their note.

## 2015-12-11 NOTE — Progress Notes (Signed)
Anti-Coagulation Progress Note  Sarah Simon is a 55 y.o. female who is currently on an anti-coagulation regimen.    RECENT RESULTS: Recent results are below, the most recent result is correlated with a dose of 12 mg. per week: Lab Results  Component Value Date   INR 2.5 12/11/2015   INR 1.80 11/13/2015   INR 1.4 03/16/2015    ANTI-COAG DOSE: Anticoagulation Dose Instructions as of 12/11/2015      Dorene Grebe Tue Wed Thu Fri Sat   New Dose 1 mg 2 mg 2 mg 2 mg 2 mg 2 mg 1 mg    Description   Take 1 tablet on all days--EXCEPT Sundays and Saturdays--take only 1/2 tablet on Sundays and Saturdays.       ANTICOAG SUMMARY: Anticoagulation Episode Summary    Current INR goal:   2.0-3.0  TTR:   52.5 % (5.8 y)  Next INR check:   01/22/2016  INR from last check:   2.5 (12/11/2015)  Weekly max dose:     Target end date:   Indefinite  INR check location:   [facility]  Preferred lab:     Send INR reminders to:   [name]   Indications   History of pulmonary embolism (Resolved) [Z86.711] DVT (Resolved) [I82.409] Lifelong Coumadin therapy [Z79.01]       Comments:         Anticoagulation Care Providers    Provider Role Specialty Phone number   Bartholomew Crews, MD  Internal Medicine [phone]      ANTICOAG TODAY: Anticoagulation Summary  As of 12/11/2015   INR goal:   2.0-3.0  TTR:     Today's INR:   2.5  Next INR check:   01/22/2016  Target end date:   Indefinite   Indications   History of pulmonary embolism (Resolved) [Z86.711] DVT (Resolved) [I82.409] Lifelong Coumadin therapy [Z79.01]        Anticoagulation Episode Summary    INR check location:   [facility]   Preferred lab:      Send INR reminders to:   [name]   Comments:       Anticoagulation Care Providers    Provider Role Specialty Phone number   Bartholomew Crews, MD  Internal Medicine [phone]      PATIENT INSTRUCTIONS: Patient instructed to take 1 tablet on all  days--EXCEPT Sundays and Saturdays--take only 1/2 tablet on Sundays and Saturdays.   FOLLOW-UP Return in 6 weeks (on 01/22/2016) for Follow up INR at 0945h.  Jorene Guest, III Pharm.D., CACP

## 2015-12-12 LAB — BMP8+ANION GAP
ANION GAP: 20 mmol/L — AB (ref 10.0–18.0)
BUN/Creatinine Ratio: 18 (ref 9–23)
BUN: 12 mg/dL (ref 6–24)
CALCIUM: 9.2 mg/dL (ref 8.7–10.2)
CHLORIDE: 97 mmol/L (ref 96–106)
CO2: 26 mmol/L (ref 18–29)
Creatinine, Ser: 0.66 mg/dL (ref 0.57–1.00)
GFR calc Af Amer: 115 mL/min/{1.73_m2} (ref >59)
GFR calc non Af Amer: 100 mL/min/{1.73_m2} (ref >59)
GLUCOSE: 123 mg/dL — AB (ref 65–99)
POTASSIUM: 3.5 mmol/L (ref 3.5–5.2)
Sodium: 143 mmol/L (ref 134–144)

## 2015-12-12 NOTE — Assessment & Plan Note (Addendum)
Assessment:  Hypertension Patient was previously seen in the internal medicine clinic on 11/13/15.  At that time she was not taking her blood pressure medications and her blood pressure was elevated at 170/95.  She was placed back on her lisinopril-hydrochlorothiazide 20 mg-25 mg daily. Bmet was checked at that visit and was told to follow-up in 4 weeks for blood pressure recheck.  Creatinine was .50 at that visit  Today blood pressure remains uncontrolled at 168/77.  Will try a trial of tribenzor 40-5-25 for one month and have patient return to the clinic for blood pressure recheck. I told the patient to stop the  lisinopril-hydrochlorothiazide 20 mg-25 mg pill.  Will also get a bmet today.  Plan -Tribenzor  -BMET - follow up in 4 weeks for blood pressure re-check  Addendum:  Creatinine was 0.66

## 2015-12-12 NOTE — Assessment & Plan Note (Signed)
Assessment: Skin lesion Image was taken during visit.  She has an approximately 1cm round, erythematous flaky region with a dark center on the left anterior lower leg.  She states that the lesion has been there for the past year and a half. She states that it's nonhealing but does not drain any fluid. She denies any growth or changes to the region.  She has a history of melanoma and has visited several dermatologists in the past. Due to financial reasons she is not able to return.  I explained the importance of trying to see a dermatologist due to her history of skin cancer to further assess the lesion on her leg.  Plan -Dermatology referral

## 2015-12-17 NOTE — Progress Notes (Signed)
Internal Medicine Clinic Attending  I saw and evaluated the patient.  I personally confirmed the key portions of the history and exam documented by Dr. J. Cadell Gabrielson and I reviewed pertinent patient test results.  The assessment, diagnosis, and plan were formulated together and I agree with the documentation in the resident's note.  

## 2016-01-05 ENCOUNTER — Ambulatory Visit: Payer: Self-pay

## 2016-01-16 MED FILL — OLMSRTN-AMLDPN-HCTZ 40-5-25: 40-5-25 | 30 days supply | Qty: 30 | Fill #1

## 2016-01-19 ENCOUNTER — Ambulatory Visit: Payer: Self-pay

## 2016-01-22 ENCOUNTER — Ambulatory Visit (INDEPENDENT_AMBULATORY_CARE_PROVIDER_SITE_OTHER): Payer: Self-pay | Admitting: Pharmacist

## 2016-01-22 DIAGNOSIS — Z86711 Personal history of pulmonary embolism: Secondary | ICD-10-CM

## 2016-01-22 DIAGNOSIS — Z86718 Personal history of other venous thrombosis and embolism: Secondary | ICD-10-CM

## 2016-01-22 DIAGNOSIS — Z7901 Long term (current) use of anticoagulants: Secondary | ICD-10-CM

## 2016-01-22 LAB — POCT INR: INR: 1.4

## 2016-01-22 MED ORDER — WARFARIN SODIUM 2 MG PO TABS: 2.0000 mg | ORAL_TABLET | Freq: Every day | ORAL | 0 refills | Status: DC

## 2016-01-22 MED FILL — WARFARIN SODIUM 2 MG TABLET: 2 | 30 days supply | Qty: 30 | Fill #0

## 2016-01-22 NOTE — Progress Notes (Signed)
Anti-Coagulation Progress Note  Sarah Simon is a 56 y.o. female who is currently on an anti-coagulation regimen.    RECENT RESULTS: Recent results are below, the most recent result is correlated with a dose of 12 mg. per week: Lab Results  Component Value Date   INR 1.40 01/22/2016   INR 2.5 12/11/2015   INR 1.80 11/13/2015    ANTI-COAG DOSE: Anticoagulation Dose Instructions as of 01/22/2016      Dorene Grebe Tue Wed Thu Fri Sat   New Dose 1 mg 2 mg 2 mg 2 mg 2 mg 2 mg 1 mg    Description   Take 1 tablet of your lavender 2mg  strength warfarin tablet by mouth once-daily at 6PM.      ANTICOAG SUMMARY: Anticoagulation Episode Summary    Current INR goal:   2.0-3.0  TTR:   52.4 % (5.9 y)  Next INR check:   03/04/2016  INR from last check:   1.40! (01/22/2016)  Weekly max dose:     Target end date:   Indefinite  INR check location:   [facility]  Preferred lab:     Send INR reminders to:   [name]   Indications   History of pulmonary embolism (Resolved) [Z86.711] DVT (Resolved) [I82.409] Lifelong Coumadin therapy [Z79.01]       Comments:         Anticoagulation Care Providers    Provider Role Specialty Phone number   Bartholomew Crews, MD  Internal Medicine [phone]      ANTICOAG TODAY: Anticoagulation Summary  As of 01/22/2016   INR goal:   2.0-3.0  TTR:     Today's INR:   1.40!  Next INR check:   03/04/2016  Target end date:   Indefinite   Indications   History of pulmonary embolism (Resolved) [Z86.711] DVT (Resolved) [I82.409] Lifelong Coumadin therapy [Z79.01]        Anticoagulation Episode Summary    INR check location:   [facility]   Preferred lab:      Send INR reminders to:   [name]   Comments:       Anticoagulation Care Providers    Provider Role Specialty Phone number   Bartholomew Crews, MD  Internal Medicine [phone]      PATIENT INSTRUCTIONS: Patient instructed to take medications as defined in the  Anti-coagulation Track section of this encounter.  Patient instructed to take today's dose.  Patient instructed to take 1 tablet of your lavender 2mg  strength warfarin tablet(s) by mouth once-daily at [facility].  Patient verbalized understanding of these instructions.     FOLLOW-UP Return in 6 weeks (on 03/04/2016) for Follow up INR at 1000h.  Jorene Guest, III Pharm.D., CACP

## 2016-01-22 NOTE — Patient Instructions (Signed)
Patient instructed to take medications as defined in the Anti-coagulation Track section of this encounter.  Patient instructed to take today's dose.  Patient instructed to take 1 tablet of your lavender 2mg  strength warfarin tablet(s) by mouth once-daily at [facility].  Patient verbalized understanding of these instructions.

## 2016-01-23 NOTE — Progress Notes (Signed)
INTERNAL MEDICINE TEACHING ATTENDING ADDENDUM - Lucious Groves, DO Duration- indefinate, Indication- DVT/PE, INR-  Lab Results  Component Value Date   INR 1.40 01/22/2016  . Agree with pharmacy recommendations as outlined in their note.

## 2016-03-04 ENCOUNTER — Ambulatory Visit: Payer: Self-pay

## 2016-03-11 ENCOUNTER — Ambulatory Visit: Payer: Self-pay

## 2016-03-22 ENCOUNTER — Telehealth: Payer: Self-pay | Admitting: Internal Medicine

## 2016-03-22 NOTE — Telephone Encounter (Signed)
TALKED WITH PATIENT, LET HER KNOW I HAVE PENDING REFERRAL FOR HER. I HAVE TO HOLD REFERRAL UNTIL SHE CAN RENEW HER GCCN & CAFA APPLICATIONS. PATIENT IS WAITING ON HER HUSBAND TO FILE HIS TAXES FOR 2017, HER SPOUSE IS SELF-EMPLOYED, I CAN NOT RENEW HER ASSISTANCE WITHOUT HIS TAX RETURN.

## 2016-03-26 ENCOUNTER — Other Ambulatory Visit: Payer: Self-pay

## 2016-03-26 DIAGNOSIS — F411 Generalized anxiety disorder: Secondary | ICD-10-CM

## 2016-03-27 ENCOUNTER — Other Ambulatory Visit: Payer: Self-pay | Admitting: Pharmacist

## 2016-03-27 MED ORDER — WARFARIN SODIUM 2 MG PO TABS: 2.0000 mg | ORAL_TABLET | Freq: Every day | ORAL | 0 refills | Status: DC

## 2016-03-27 NOTE — Telephone Encounter (Signed)
On our last visit, I discussed that klonopin is not something I agreed with and she should try other meds for her anxiety. I am not going to refill this.

## 2016-03-28 ENCOUNTER — Ambulatory Visit (INDEPENDENT_AMBULATORY_CARE_PROVIDER_SITE_OTHER): Payer: Self-pay | Admitting: Pharmacist

## 2016-03-28 DIAGNOSIS — Z86711 Personal history of pulmonary embolism: Secondary | ICD-10-CM

## 2016-03-28 DIAGNOSIS — Z7901 Long term (current) use of anticoagulants: Secondary | ICD-10-CM

## 2016-03-28 DIAGNOSIS — Z86718 Personal history of other venous thrombosis and embolism: Secondary | ICD-10-CM

## 2016-03-28 LAB — POCT INR: INR: 1.1

## 2016-03-28 MED ORDER — WARFARIN SODIUM 2 MG PO TABS: 2.0000 mg | ORAL_TABLET | Freq: Every day | ORAL | 2 refills | Status: DC

## 2016-03-28 MED FILL — WARFARIN SODIUM 2 MG TABLET: 2 | 30 days supply | Qty: 30 | Fill #0

## 2016-03-28 NOTE — Patient Instructions (Signed)
Re-start your warfarin taking 2 mg ( 1 tablet) daily. Please follow-up with the coumadin clinic in 2 weeks on 3/29.

## 2016-03-28 NOTE — Progress Notes (Signed)
Anti-Coagulation Progress Note  Sarah Simon is a 56 y.o. female who is currently on an anti-coagulation regimen.    RECENT RESULTS: Recent results are below, the most recent result is correlated with a dose of 14 mg. per week: Patient technically only took 6 mg this week- No doses since Saturday. Lab Results  Component Value Date   INR 1.1 03/28/2016   INR 1.40 01/22/2016   INR 2.5 12/11/2015    ANTI-COAG DOSE: Anticoagulation Dose Instructions as of 03/28/2016      Dorene Grebe Tue Wed Thu Fri Sat   New Dose 2 mg 2 mg 2 mg 2 mg 2 mg 2 mg 2 mg    Description   Take 1 tablet of your lavender 2mg  strength warfarin tablet by mouth once-daily at 6PM.      ANTICOAG SUMMARY: Anticoagulation Episode Summary    Current INR goal:   2.0-3.0  TTR:   50.8 % (6 y)  Next INR check:   04/11/2016  INR from last check:   1.1! (03/28/2016)  Weekly max dose:     Target end date:   Indefinite  INR check location:   [facility]  Preferred lab:     Send INR reminders to:   [name]   Indications   History of pulmonary embolism (Resolved) [Z86.711] DVT (Resolved) [I82.409] Lifelong Coumadin therapy [Z79.01]       Comments:         Anticoagulation Care Providers    Provider Role Specialty Phone number   Bartholomew Crews, MD  Internal Medicine [phone]      ANTICOAG TODAY: Anticoagulation Summary  As of 03/28/2016   INR goal:   2.0-3.0  TTR:     Today's INR:   1.1!  Next INR check:   04/11/2016  Target end date:   Indefinite   Indications   History of pulmonary embolism (Resolved) [Z86.711] DVT (Resolved) [I82.409] Lifelong Coumadin therapy [Z79.01]        Anticoagulation Episode Summary    INR check location:   [facility]   Preferred lab:      Send INR reminders to:   [name]   Comments:       Anticoagulation Care Providers    Provider Role Specialty Phone number   Bartholomew Crews, MD  Internal Medicine [phone]      PATIENT  INSTRUCTIONS:  Re-start your warfarin taking 2 mg ( 1 tablet) daily. Please follow-up with the coumadin clinic in 2 weeks on 3/2.  FOLLOW-UP - Patient has not taken any warfarin since Saturday due to lack of refills.  - Will send in a refill today. She will re-start 2 mg daily.  Return in about 2 weeks (around 04/11/2016) for INR check at 9 AM.  Uvaldo Bristle, PharmD PGY1 Pharmacy Resident Pager: [phone]

## 2016-04-29 ENCOUNTER — Encounter: Payer: Self-pay | Admitting: Internal Medicine

## 2016-05-17 MED FILL — WARFARIN SODIUM 2 MG TABLET: 2 | 30 days supply | Qty: 30 | Fill #1

## 2016-06-21 ENCOUNTER — Encounter: Payer: Self-pay | Admitting: *Deleted

## 2016-07-16 MED FILL — WARFARIN SODIUM 2 MG TABLET: 2 | 30 days supply | Qty: 30 | Fill #2

## 2016-07-29 ENCOUNTER — Encounter: Payer: Self-pay | Admitting: Internal Medicine

## 2016-08-09 NOTE — Progress Notes (Deleted)
   CC: HTN follow up  HPI:  [name] is a 56 y.o. 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 The patient is on daily coumadin, 2 mg daily for lifelong prophylaxis secondary to Factor V Leiden mutation although the patient has not been seen in coumadin clinic since 03/2016. At her last clinic visit on 11/2015 the patient's HTN was uncontrolled and she was started on omlesartan, amlodipine, HCTZ (40-5-25 mg) and told to follow up in 1 month for reassessment. She was also given a referral to dermatology for a concerning non, healing lesion on the lateral aspect of her LLE. It is unclear how she has been getting her medications and if she proceeded with the referral.   Past Medical History:  Diagnosis Date  . Factor V Leiden mutation ([place])   . Hepatitis C carrier ([place])   . Hypertension 08/04/2006   On and off medications  Has cost problems for her medications  HTN without complications     . Melanoma ([place])    unclear diagnosis, this is per pt report, no treatment or follow up known. per pt on left leg  . Pulmonary embolism [facility]) May 2003    On Lifelong coumidin Factor V leiden def  . Skin cancer    multiple chest and back   . Subarachnoid hemorrhage [facility])    Required surgery for clipping   . Suicide attempt    Review of Systems:  ***  Physical Exam:  There were no vitals filed for this visit. ***  Assessment & Plan:   See Encounters Tab for problem based charting.  Patient {GC/GE:[id number]::"discussed with","seen with"} Dr. {NAMES:[id number]::"Butcher","Granfortuna","E. [name]","[name]","[name]","[name]","[name]","[name]"}

## 2016-08-12 ENCOUNTER — Encounter: Payer: Self-pay | Admitting: Internal Medicine

## 2016-09-26 MED FILL — WARFARIN SODIUM 2 MG TABLET: 2 | 30 days supply | Qty: 30 | Fill #3

## 2016-11-06 IMAGING — US US ABDOMEN COMPLETE
1 series · 13 of 25 positions shown · non-contrast
Comparison: None.

CLINICAL DATA: Hepatitis-C.  Evaluate for cirrhosis.

EXAM:
ULTRASOUND ABDOMEN COMPLETE

[Series 1: us abdomen complete · 0.22mm/px · 13 of 87 slices shown]
[im 1/87]
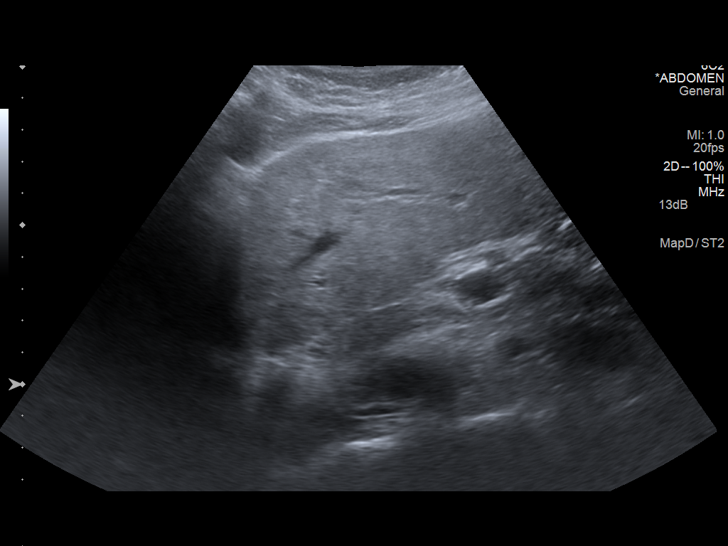
[im 8/87]
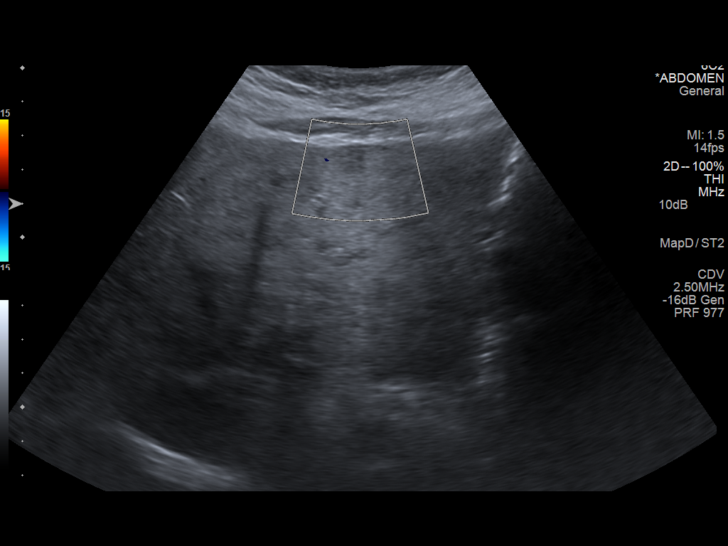
[im 15/87]
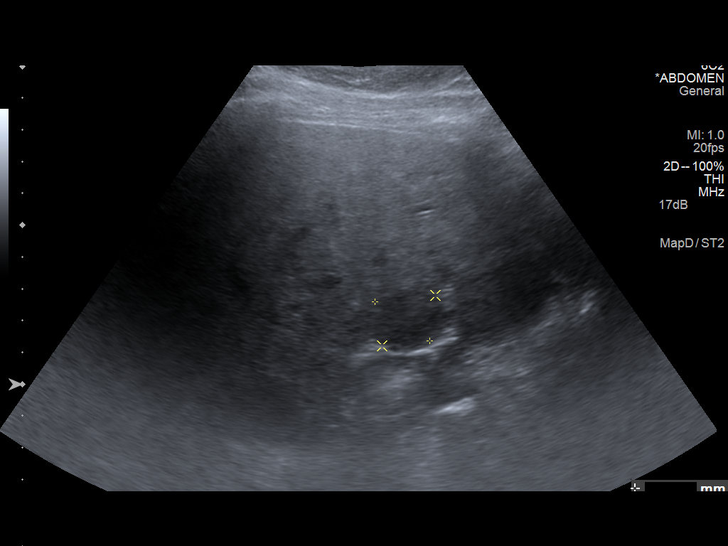
[im 22/87]
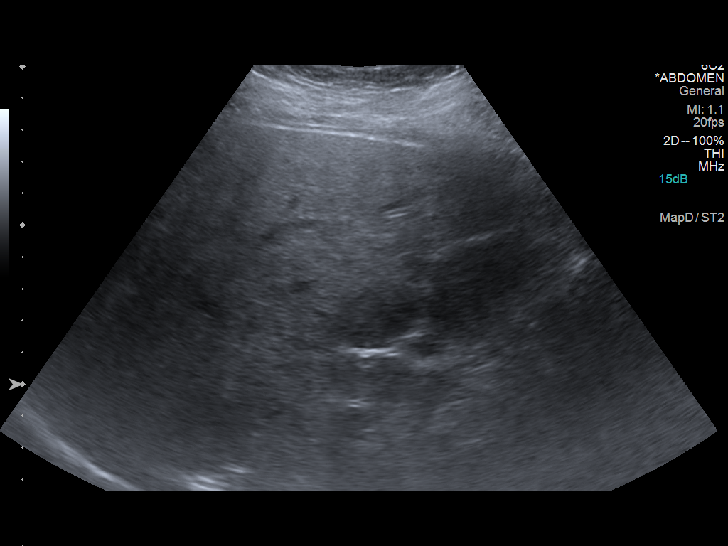
[im 29/87]
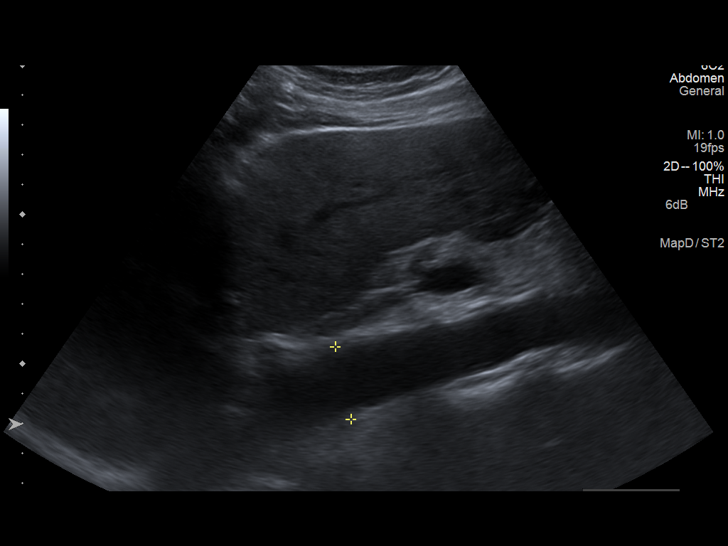
[im 36/87]
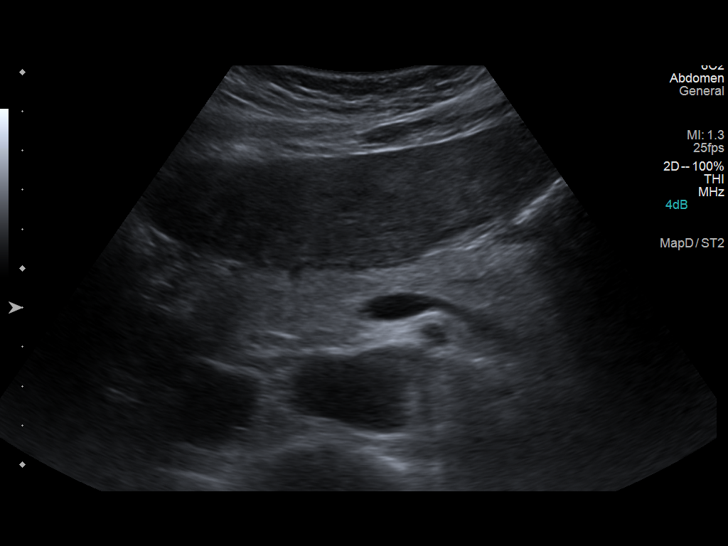
[im 44/87]
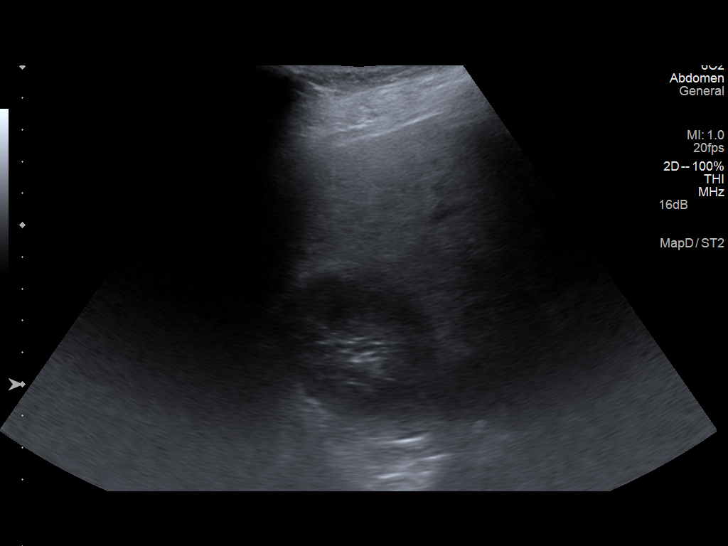
[im 51/87]
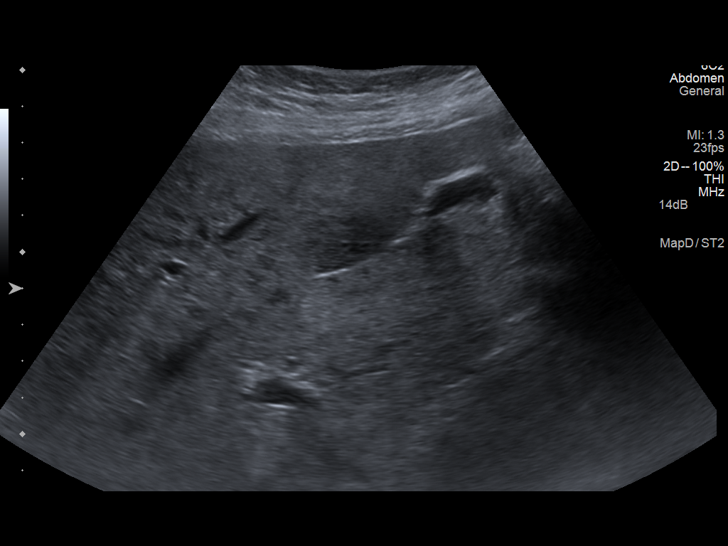
[im 58/87]
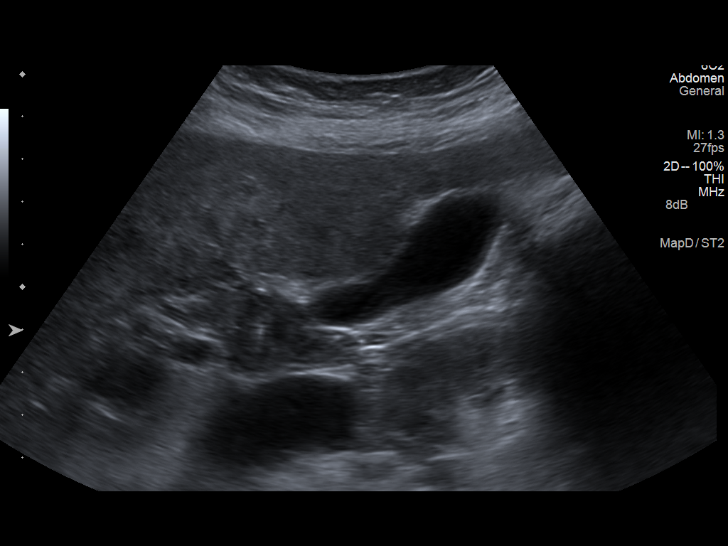
[im 65/87]
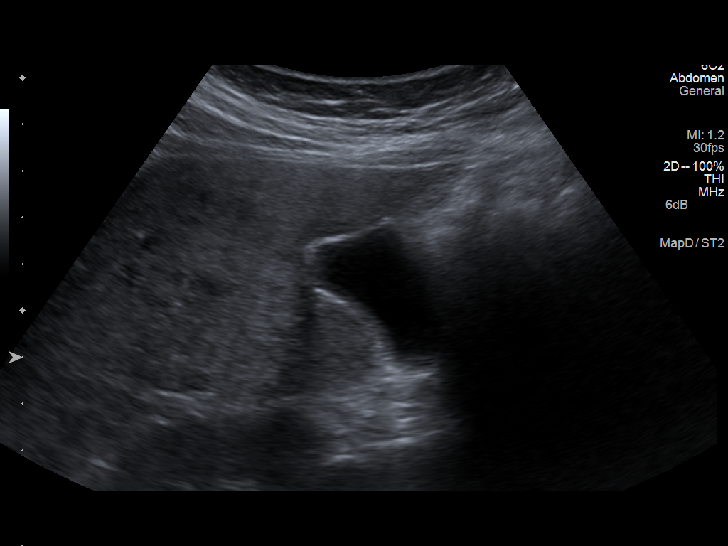
[im 72/87]
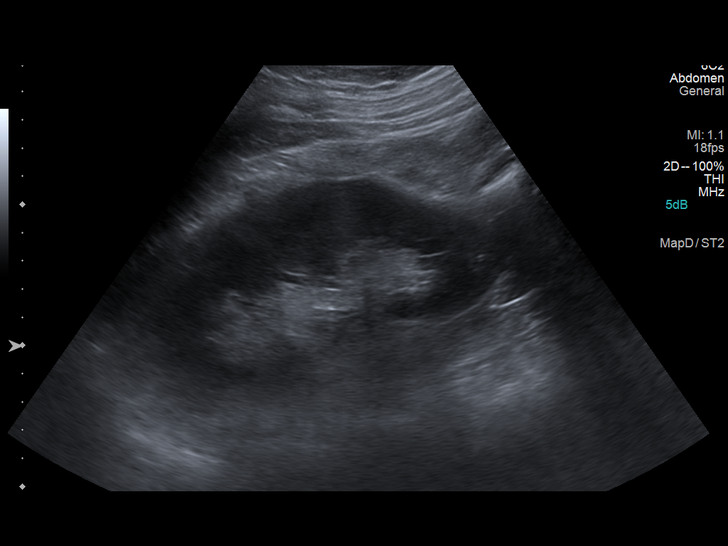
[im 79/87]
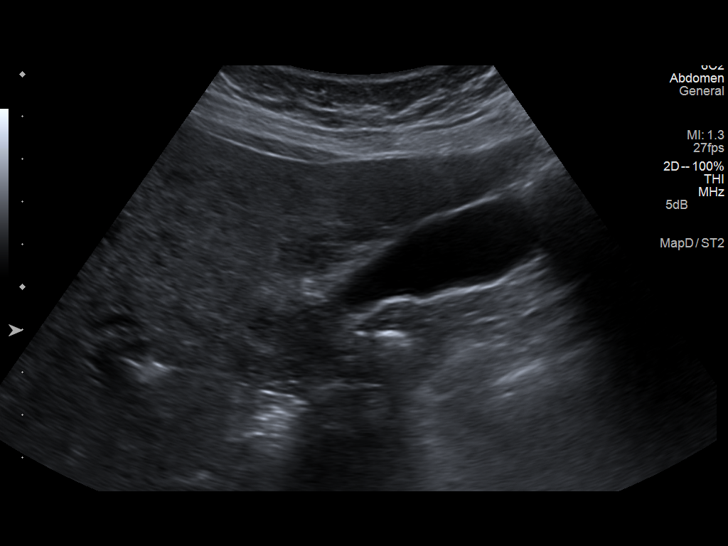
[im 87/87]
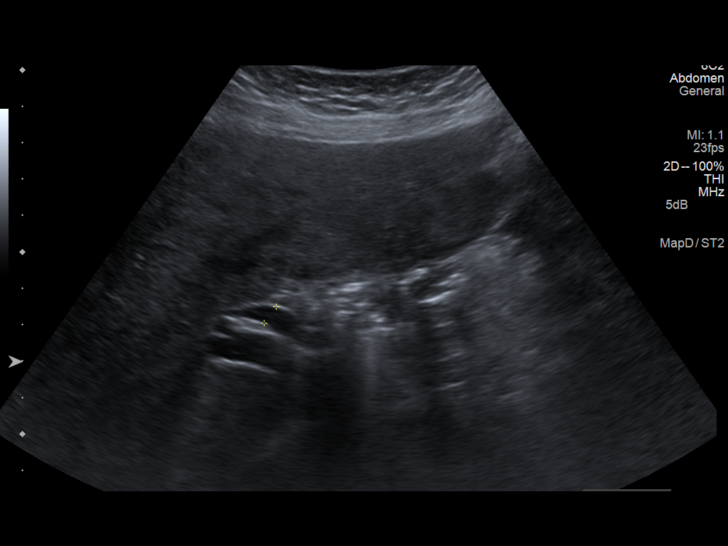

[13 of 25 positions shown; findings below may reference images not displayed]

FINDINGS: Gallbladder: No gallstones or wall thickening visualized. No
sonographic Murphy sign noted.

Common bile duct: Diameter: 0.6 cm

Liver: There is a round hypoechoic lesion near the left hepatic dome
measuring 1.0 x 1.1 x 1.2 cm. There is a round hypoechoic lesion in
the posterior right hepatic lobe that measures 2.1 x 2.3 x 2.2 cm.
Ill-defined hypoechoic lesion along the lower liver measures up to
2.5 cm. Unclear if this represents a lesion versus fat sparing. No
evidence for ascites. The liver parenchyma is echogenic and
heterogeneous. Mild nodular contour along the anterior left hepatic
lobe. The main portal vein is patent.

IVC: No abnormality visualized.

Pancreas: Visualized portion unremarkable.

Spleen: Surgically absent.

Right Kidney: Length: 12.6 cm. Echogenicity within normal limits. No
mass or hydronephrosis visualized.

Left Kidney: Length: 13.1 cm. Echogenicity within normal limits. No
mass or hydronephrosis visualized.

Abdominal aorta: No aneurysm visualized.

Other findings: No ascites in the upper abdomen.
IMPRESSION: Liver parenchyma is heterogeneous and echogenic with mild
nodularity. Findings are concerning for cirrhosis. In addition,
there are three hypoechoic lesions in the liver concerning for
neoplasms such as hepatocellular carcinoma. Recommend further
characterization of the liver with a pre and post contrast liver
MRI.

These results will be called to the ordering clinician or
representative by the Radiologist Assistant, and communication
documented in the PACS or zVision Dashboard.

## 2016-12-20 ENCOUNTER — Ambulatory Visit (INDEPENDENT_AMBULATORY_CARE_PROVIDER_SITE_OTHER): Payer: Self-pay | Admitting: Internal Medicine

## 2016-12-20 VITALS — BP 175/95 | HR 63 | Temp 98.0°F | Ht 67.0 in | Wt 150.0 lb

## 2016-12-20 DIAGNOSIS — Z9114 Patient's other noncompliance with medication regimen: Secondary | ICD-10-CM

## 2016-12-20 DIAGNOSIS — Z7901 Long term (current) use of anticoagulants: Secondary | ICD-10-CM

## 2016-12-20 DIAGNOSIS — I10 Essential (primary) hypertension: Secondary | ICD-10-CM

## 2016-12-20 DIAGNOSIS — Z87891 Personal history of nicotine dependence: Secondary | ICD-10-CM

## 2016-12-20 DIAGNOSIS — R791 Abnormal coagulation profile: Secondary | ICD-10-CM

## 2016-12-20 LAB — POCT INR: INR: 1

## 2016-12-20 MED ORDER — WARFARIN SODIUM 2 MG PO TABS: 2.0000 mg | ORAL_TABLET | Freq: Every day | ORAL | 1 refills | Status: DC

## 2016-12-20 MED ORDER — LISINOPRIL-HYDROCHLOROTHIAZIDE 20-25 MG PO TABS: 1.0000 | ORAL_TABLET | Freq: Every day | ORAL | 1 refills | Status: DC

## 2016-12-20 NOTE — Progress Notes (Signed)
CC: here for INR check and HTN f/u  HPI:  [name] is a 56 y.o. woman with a past medical history listed below here today for follow up of her INR check and HTN.  For details of today's visit and the status of her chronic medical issues please refer to the assessment and plan.   Past Medical History:  Diagnosis Date  . Factor V Leiden mutation ([place])   . Hepatitis C carrier ([place])   . Hypertension 08/04/2006   On and off medications  Has cost problems for her medications  HTN without complications     . Melanoma ([place])    unclear diagnosis, this is per pt report, no treatment or follow up known. per pt on left leg  . Pulmonary embolism [facility]) May 2003    On Lifelong coumidin Factor V leiden def  . Skin cancer    multiple chest and back   . Subarachnoid hemorrhage [facility])    Required surgery for clipping   . Suicide attempt [facility])    Review of Systems:    General: Denies fever and fatigue.  Respiratory: Denies SOB, cough.   Cardiovascular: Denies chest pain.  Gastrointestinal: Denies nausea, vomiting, abdominal pain, and diarrhea.  Neurological: Denies dizziness, syncope, weakness, lightheadedness, and headaches.  Psychiatric/Behavioral: Denies mood changes, sleep disturbance, and agitation.  Physical Exam:  Vitals:   12/20/16 0941  BP: (!) 175/95  Pulse: 63  Temp: 98 F (36.7 C)  TempSrc: Oral  SpO2: 97%  Weight: 150 lb (68 kg)  Height: 5\' 7"  (1.702 m)   Physical Exam  Constitutional: She is oriented to person, place, and time. She appears well-developed and well-nourished. No distress.  HENT:  Head: Normocephalic and atraumatic.  Eyes: Conjunctivae and EOM are normal.  Cardiovascular: Normal rate, regular rhythm and normal heart sounds.  Pulmonary/Chest: Effort normal and breath sounds normal.  Musculoskeletal: Normal range of motion. She exhibits no edema or tenderness.  Neurological: She is alert and oriented to person, place, and time.  Skin: Skin  is warm and dry. No erythema.     Assessment & Plan:   See Encounters Tab for problem based charting.  Patient discussed with Dr. Lynnae January .  Hypertension BP Readings from Last 3 Encounters:  12/20/16 (!) 175/95  12/11/15 (!) 168/77  11/13/15 (!) 170/95   Assessment: Her blood pressure remains uncontrolled.  Has not taken meds for about 3 months per her initial report, but on further questioning has probably been closer to 1 year.  She is prescribed olmesartan-HCTZ-amlodipine combination through [place].  She states it is easier to get to [facility] because they are open on the weekends and would prefer Rx to their pharmacy.  She is without insurance but can afford the $4 list.  She denies any CP, HA, SOB.  Plan: - DC olmesartan-HCTZ-amlodipine as not on $4 list - Start Lisinopril-HCTZ 20-25mg  daily - RTC 2 weeks for [facility] visit to check BP and BMET  Lifelong Coumadin therapy Assessment: Has not had her INR checked since March of this year.  Checked today and it was 1.0.  She reports not taking for the past 2 weeks but based on her adherence, may be longer.  She reports she has difficulty remembering to take medications and follow up.  Plan: - resume Coumadin at prior dose of 2mg  daily.  She was given new Rx to do so. - asked her to schedule INR check and pharmacy visit next week - will inquire  into her eligibility for the geriatric task force to improve adherence

## 2016-12-20 NOTE — Patient Instructions (Signed)
FOLLOW-UP INSTRUCTIONS When: Wednesday or Thursday next week with Dr Maudie Mercury for INR check.  ACC in 2 weeks for appointment with Dr Berneice Gandy For: INR and BP What to bring: current medications  I have refilled your Coumadin.  Please come to clinic next week for an appt with Dr Maudie Mercury to check your INR  I have changed your BP medications to be 1 pill that includes lisinopril and HCTZ.  Please make an appt to be seen in 2 weeks to check on you.

## 2016-12-20 NOTE — Assessment & Plan Note (Signed)
BP Readings from Last 3 Encounters:  12/20/16 (!) 175/95  12/11/15 (!) 168/77  11/13/15 (!) 170/95   Assessment: Her blood pressure remains uncontrolled.  Has not taken meds for about 3 months per her initial report, but on further questioning has probably been closer to 1 year.  She is prescribed olmesartan-HCTZ-amlodipine combination through [place].  She states it is easier to get to [facility] because they are open on the weekends and would prefer Rx to their pharmacy.  She is without insurance but can afford the $4 list.  She denies any CP, HA, SOB.  Plan: - DC olmesartan-HCTZ-amlodipine as not on $4 list - Start Lisinopril-HCTZ 20-25mg  daily - RTC 2 weeks for [facility] visit to check BP and BMET

## 2016-12-20 NOTE — Assessment & Plan Note (Signed)
Assessment: Has not had her INR checked since March of this year.  Checked today and it was 1.0.  She reports not taking for the past 2 weeks but based on her adherence, may be longer.  She reports she has difficulty remembering to take medications and follow up.  Plan: - resume Coumadin at prior dose of 2mg  daily.  She was given new Rx to do so. - asked her to schedule INR check and pharmacy visit next week - will inquire into her eligibility for the geriatric task force to improve adherence

## 2016-12-20 NOTE — Progress Notes (Signed)
Internal Medicine Clinic Attending  Case discussed with Dr. Wallace at the time of the visit.  We reviewed the resident's history and exam and pertinent patient test results.  I agree with the assessment, diagnosis, and plan of care documented in the resident's note.  

## 2016-12-25 ENCOUNTER — Ambulatory Visit: Payer: Self-pay | Admitting: Pharmacist

## 2016-12-26 ENCOUNTER — Ambulatory Visit: Payer: Self-pay | Admitting: Pharmacist

## 2016-12-30 ENCOUNTER — Ambulatory Visit: Payer: Self-pay | Admitting: Pharmacist

## 2017-01-03 ENCOUNTER — Encounter: Payer: Self-pay | Admitting: Internal Medicine

## 2017-01-03 ENCOUNTER — Ambulatory Visit: Payer: Self-pay

## 2017-03-12 ENCOUNTER — Encounter: Payer: Self-pay | Admitting: Internal Medicine

## 2017-04-04 ENCOUNTER — Telehealth: Payer: Self-pay | Admitting: Pharmacist

## 2017-04-25 ENCOUNTER — Other Ambulatory Visit (INDEPENDENT_AMBULATORY_CARE_PROVIDER_SITE_OTHER): Payer: Self-pay

## 2017-04-25 ENCOUNTER — Ambulatory Visit: Payer: Self-pay

## 2017-04-25 DIAGNOSIS — Z7901 Long term (current) use of anticoagulants: Secondary | ICD-10-CM

## 2017-04-25 DIAGNOSIS — Z5181 Encounter for therapeutic drug level monitoring: Secondary | ICD-10-CM

## 2017-04-25 DIAGNOSIS — Z86718 Personal history of other venous thrombosis and embolism: Secondary | ICD-10-CM

## 2017-04-25 DIAGNOSIS — Z86711 Personal history of pulmonary embolism: Secondary | ICD-10-CM

## 2017-04-25 LAB — POCT INR: INR: 1.1

## 2017-04-25 MED ORDER — WARFARIN SODIUM 2 MG PO TABS: 2.0000 mg | ORAL_TABLET | Freq: Every day | ORAL | 1 refills | Status: DC

## 2017-04-25 NOTE — Telephone Encounter (Signed)
Unable to reach.

## 2017-04-25 NOTE — Progress Notes (Signed)
Ms. Thornell walked in today for INR evaluation after missing recent appointments. Paulla Dolly not available, so visit deferred to me as attending in clinic.  She reports she ran out of her warfarin 3 days ago, but before that was running low and had started spacing out her doses.   She reports her current schedule is supposed to be 2 mg on 4 days, 1 mg on 3 days, for total weekly dose of 11 mg.  She denies any complaints or symptoms today, no change in her health status since last visit.   Medications reviewed and reconciled in the chart.  INR 1.1 today  Prior INR: 1.1 on 12/20/16 1.0 on 03/28/16  Assessment and Plan: 57 yo woman on lifelong anticoagulation for PE in the context of reported Factor V Leiden deficiency. INR is subtherapeutic at 1.1 due to running out of her medication. Will resume for now have her follow up in coumadin clinic in 1 week for recheck. No signs or symptoms of clotting today.   There is a discrepancy between her reported warfarin dose and the chart. She reports alternating 2 mg and 1 mg for total weekly dose of 11 mg, whereas last note documents 2 mg daily for total weekly dose of 14 mg. I have asked her to resume her dosing schedule as she usually does, with adjustment going forward.  All of this will be limited by her ability to come to appointments and notify us she is running low before she runs out. A DOAC would be a much more attractive option for her, but she is self-pay and cost would be a limiting factor.

## 2017-05-19 ENCOUNTER — Other Ambulatory Visit: Payer: Self-pay

## 2017-05-19 DIAGNOSIS — Z7901 Long term (current) use of anticoagulants: Secondary | ICD-10-CM

## 2017-05-19 MED ORDER — WARFARIN SODIUM 2 MG PO TABS: 2.0000 mg | ORAL_TABLET | Freq: Every day | ORAL | 1 refills | Status: DC

## 2017-05-19 MED ORDER — LISINOPRIL-HYDROCHLOROTHIAZIDE 20-25 MG PO TABS: 1.0000 | ORAL_TABLET | Freq: Every day | ORAL | 1 refills | Status: DC

## 2017-05-19 NOTE — Progress Notes (Signed)
Sarah Simon is a 57 y.o. female who was contacted on behalf of [facility] Geriatrics Task Force.    Patient reports adherence to medications. She does not monitor blood pressure at home. I recommended using the free blood pressure cuff at [facility] when she picks up her medications and to write it down/report it back to clinic.

## 2017-05-20 ENCOUNTER — Ambulatory Visit (INDEPENDENT_AMBULATORY_CARE_PROVIDER_SITE_OTHER): Payer: Self-pay | Admitting: Internal Medicine

## 2017-05-20 VITALS — BP 199/91 | HR 65 | Temp 97.8°F | Wt 149.3 lb

## 2017-05-20 DIAGNOSIS — I10 Essential (primary) hypertension: Secondary | ICD-10-CM

## 2017-05-20 DIAGNOSIS — R509 Fever, unspecified: Secondary | ICD-10-CM

## 2017-05-20 DIAGNOSIS — H9319 Tinnitus, unspecified ear: Secondary | ICD-10-CM

## 2017-05-20 DIAGNOSIS — L989 Disorder of the skin and subcutaneous tissue, unspecified: Secondary | ICD-10-CM

## 2017-05-20 DIAGNOSIS — D682 Hereditary deficiency of other clotting factors: Secondary | ICD-10-CM

## 2017-05-20 DIAGNOSIS — Z79899 Other long term (current) drug therapy: Secondary | ICD-10-CM

## 2017-05-20 DIAGNOSIS — Z5181 Encounter for therapeutic drug level monitoring: Secondary | ICD-10-CM

## 2017-05-20 DIAGNOSIS — Z7901 Long term (current) use of anticoagulants: Secondary | ICD-10-CM

## 2017-05-20 LAB — POCT INR: INR: 2.7

## 2017-05-20 MED ORDER — AMLODIPINE BESYLATE 5 MG PO TABS: 5.0000 mg | ORAL_TABLET | Freq: Every day | ORAL | 2 refills | Status: DC

## 2017-05-20 NOTE — Assessment & Plan Note (Signed)
Sarah Simon has been uncontrolled due to medication nonadherence from cost and lack of follow up. She reports taking the lisinopril-HCTZ 20-25mg  daily for almost 3 weeks now. BP is uncontrolled today at 156/88 and 199/91 on a repeat measurement. He is feeling in usual health today. Plan: Add amlodipine 5mg  PO daily Continue lisinopril-HCTZ 20-25mg  Checking Bmet today RTC in 1 month for recheck

## 2017-05-20 NOTE — Progress Notes (Signed)
CC: BP check, INR check  HPI:  Sarah Simon is a 57 y.o. female with PMHx detailed below presenting for follow up of her hypertension and long term coumadin anticoagulation. She has not been seen in our clinic since December and has been off her medications until about a month ago. She is on indefinitely long term anticoagulation for factor V leiden mutation. She has no new leg swelling or pain. She has continued to notice a left shin lesion which is chronic since at least 2 years ago but otherwise no lower extremity changes.  See problem based assessment and plan below for additional details.  Hypertension Sarah Simon has been uncontrolled due to medication nonadherence from cost and lack of follow up. She reports taking the lisinopril-HCTZ 20-25mg  daily for almost 3 weeks now. BP is uncontrolled today at 156/88 and 199/91 on a repeat measurement. He is feeling in usual health today. Plan: Add amlodipine 5mg  PO daily Continue lisinopril-HCTZ 20-25mg  Checking Bmet today RTC in 1 month for recheck  Lifelong Coumadin therapy INR checked today is 2.7 today at goal (2-3). She was previously below goal due to not taking coumadin. She fortunately seems to be titrated to a good dose already as she resumed this about 6 weeks ago and has not been checked. Plan: Continue coumadin 2mg  PO daily Needs to start following up with coumadin clinic or least q52monthly clinic visits  Leg skin lesion, left This skin lesion has concerning features of erythema, irregular borders, maybe increase in size (recorded as 1cm in 2017), and possible central ulceration. She has not followed up with dermatology in years due to cost. I recommended she bring her information for a financial counseling session with Sarah Simon so we can refer her in system to have this evaluated. She expressed understanding that this needs to happen or else we can refer her but she is not able to afford consultation.   Past  Medical History:  Diagnosis Date  . Factor V Leiden mutation ([name])   . Hepatitis C carrier ([name])   . Hypertension 08/04/2006   On and off medications  Has cost problems for her medications  HTN without complications     . Melanoma ([name])    unclear diagnosis, this is per pt report, no treatment or follow up known. per pt on left leg  . Pulmonary embolism [name]) May 2003    On Lifelong coumidin Factor V leiden def  . Skin cancer    multiple chest and back   . Subarachnoid hemorrhage [name])    Required surgery for clipping   . Suicide attempt [name])     Review of Systems: Review of Systems  Constitutional: Positive for fever.  HENT: Positive for tinnitus.   Eyes: Negative for blurred vision.  Cardiovascular: Negative for chest pain and leg swelling.  Gastrointestinal: Negative for abdominal pain.  Genitourinary: Negative for dysuria.  Musculoskeletal: Negative for falls.  Skin: Negative for rash.  Neurological: Negative for headaches.  Endo/Heme/Allergies: Does not bruise/bleed easily.  Psychiatric/Behavioral: Negative for depression.     Physical Exam: Vitals:   05/20/17 0957 05/20/17 1033  BP: (!) 156/88 (!) 199/91  Pulse: 65   Temp: 97.8 F (36.6 C)   TempSrc: Oral   SpO2: 96%   Weight: 149 lb 4.8 oz (67.7 kg)    GENERAL- alert, co-operative, NAD HEENT- Oral mucosa appears moist, no carotid bruit, no cervical LN enlargement. CARDIAC- RRR, no murmurs, rubs or gallops. RESP- CTAB, no wheezes or crackles.  ABDOMEN- Soft, nontender EXTREMITIES- No edema, 1.5cm diameter erythematous irregular lesion over anterior left shin (image below)      SKIN- Warm, dry, No rash or lesion. PSYCH- Normal mood and affect, appropriate thought content and speech.   Assessment & Plan:   See encounters tab for problem based medical decision making.   Patient discussed with Dr. Angelia Mould

## 2017-05-20 NOTE — Assessment & Plan Note (Addendum)
INR checked today is 2.7 today at goal (2-3). She was previously below goal due to not taking coumadin. She fortunately seems to be titrated to a good dose already as she resumed this about 6 weeks ago and has not been checked. Plan: Continue coumadin 2mg  PO daily Needs to start following up with coumadin clinic or least q47monthly clinic visits

## 2017-05-20 NOTE — Patient Instructions (Signed)
Your blood pressure remains too high today Ms. Sarah Simon. I recommend starting another blood pressure medicine to maximize your benefit and prevent harm from this high blood pressure.  I think you need to be seen by dermatology to evaluate the left leg lesion. We may need you to get coordinated with our [place] so we can refer you at low cost.  We will also need you set up with this so we can see you more frequently for checking your INR on the daily coumadin.

## 2017-05-20 NOTE — Assessment & Plan Note (Signed)
This skin lesion has concerning features of erythema, irregular borders, maybe increase in size (recorded as 1cm in 2017), and possible central ulceration. She has not followed up with dermatology in years due to cost. I recommended she bring her information for a financial counseling session with [name] so we can refer her in system to have this evaluated. She expressed understanding that this needs to happen or else we can refer her but she is not able to afford consultation.

## 2017-05-21 LAB — BMP8+ANION GAP
Anion Gap: 16 mmol/L (ref 10.0–18.0)
BUN/Creatinine Ratio: 29 — ABNORMAL HIGH (ref 9–23)
BUN: 15 mg/dL (ref 6–24)
CO2: 28 mmol/L (ref 20–29)
Calcium: 8.5 mg/dL — ABNORMAL LOW (ref 8.7–10.2)
Chloride: 99 mmol/L (ref 96–106)
Creatinine, Ser: 0.52 mg/dL — ABNORMAL LOW (ref 0.57–1.00)
GFR calc Af Amer: 124 mL/min/{1.73_m2} (ref >59)
GFR calc non Af Amer: 107 mL/min/{1.73_m2} (ref >59)
Glucose: 104 mg/dL — ABNORMAL HIGH (ref 65–99)
Potassium: 3.6 mmol/L (ref 3.5–5.2)
Sodium: 143 mmol/L (ref 134–144)

## 2017-05-22 NOTE — Progress Notes (Signed)
Internal Medicine Clinic Attending  Case discussed with Dr. Rice at the time of the visit.  We reviewed the resident's history and exam and pertinent patient test results.  I agree with the assessment, diagnosis, and plan of care documented in the resident's note.  

## 2017-06-24 ENCOUNTER — Other Ambulatory Visit: Payer: Self-pay | Admitting: Internal Medicine

## 2017-07-02 ENCOUNTER — Encounter: Payer: Self-pay | Admitting: *Deleted

## 2017-09-17 ENCOUNTER — Ambulatory Visit: Payer: Self-pay

## 2017-11-24 ENCOUNTER — Ambulatory Visit (INDEPENDENT_AMBULATORY_CARE_PROVIDER_SITE_OTHER): Payer: Self-pay | Admitting: Pharmacist

## 2017-11-24 DIAGNOSIS — Z7901 Long term (current) use of anticoagulants: Secondary | ICD-10-CM

## 2017-11-24 DIAGNOSIS — Z86711 Personal history of pulmonary embolism: Secondary | ICD-10-CM

## 2017-11-24 DIAGNOSIS — Z86718 Personal history of other venous thrombosis and embolism: Secondary | ICD-10-CM

## 2017-11-24 DIAGNOSIS — Z5181 Encounter for therapeutic drug level monitoring: Secondary | ICD-10-CM

## 2017-11-24 LAB — POCT INR: INR: 1 — AB (ref 2.0–3.0)

## 2017-11-24 MED ORDER — WARFARIN SODIUM 2 MG PO TABS: ORAL_TABLET | ORAL | 2 refills | Status: DC

## 2017-11-24 NOTE — Patient Instructions (Signed)
Patient instructed to take medications as defined in the Anti-coagulation Track section of this encounter.  Patient instructed to take today's dose.  Patient instructed to take 1 tablet of your lavender 2mg  strength warfarin tablet by mouth once-daily at 6PM, all days of week--EXCEPT on Mondays and Thursdays--take 1 and 1/2 tablets on Mondays and Thursdays.  Patient verbalized understanding of these instructions.

## 2017-11-24 NOTE — Progress Notes (Signed)
Anticoagulation Management Sarah Simon is a 57 y.o. female who reports to the clinic for monitoring of warfarin treatment.    Indication: PE , History of (resolved); Factor V Leiden mutation; Long term (current) use of anticoagulant.   Duration: indefinite Supervising physician: Aldine Contes  Anticoagulation Clinic Visit History: Patient does not report signs/symptoms of bleeding or thromboembolism  Other recent changes: [place], medications, lifestyle changes.  Anticoagulation Episode Summary    Current INR goal:   2.0-3.0  TTR:   43.0 % (7.7 y)  Next INR check:   12/08/2017  INR from last check:   1.0! (11/24/2017)  Weekly max warfarin dose:     Target end date:   Indefinite  INR check location:   Anticoagulation Clinic  Preferred lab:     Send INR reminders to:   [name]   Indications   History of pulmonary embolism (Resolved) [Z86.711] DVT (Resolved) [I82.409] Lifelong Coumadin therapy [Z79.01]       Comments:         Anticoagulation Care Providers    Provider Role Specialty Phone number   Bartholomew Crews, MD  Internal Medicine [phone]      No Known Allergies Prior to Admission medications   Medication Sig Start Date End Date Taking? Authorizing Provider  amLODipine (NORVASC) 5 MG tablet Take 1 tablet (5 mg total) by mouth daily. 05/20/17 05/20/18 Yes Rice, Resa Miner, MD  lisinopril-hydrochlorothiazide (PRINZIDE,ZESTORETIC) 20-25 MG tablet TAKE 1 TABLET BY MOUTH ONCE DAILY 06/24/17  Yes Thomasene Ripple, MD  warfarin (COUMADIN) 2 MG tablet Take 1 and 1/2 tablets on Mondays and Thursdays, all other days, take only one (1) tablet, by mouth at 6PM. 11/24/17  Yes Pennie Banter, RPH-CPP   Past Medical History:  Diagnosis Date  . Factor V Leiden mutation ([place])   . Hepatitis C carrier ([place])   . Hypertension 08/04/2006   On and off medications  Has cost problems for her medications  HTN without complications     . Melanoma ([place])    unclear  diagnosis, this is per pt report, no treatment or follow up known. per pt on left leg  . Pulmonary embolism [facility]) May 2003    On Lifelong coumidin Factor V leiden def  . Skin cancer    multiple chest and back   . Subarachnoid hemorrhage [facility])    Required surgery for clipping   . Suicide attempt [facility])    Social History   Socioeconomic History  . Marital status: Married    Spouse name: Not on file  . Number of children: Not on file  . Years of education: Not on file  . Highest education level: Not on file  Occupational History  . Not on file  Social Needs  . Financial resource strain: Not on file  . Food insecurity:    Worry: Not on file    Inability: Not on file  . Transportation needs:    Medical: Not on file    Non-medical: Not on file  Tobacco Use  . Smoking status: Former Smoker    Types: Cigarettes    Last attempt to quit: 04/23/1970    Years since quitting: 47.6  . Smokeless tobacco: Never Used  Substance and Sexual Activity  . Alcohol use: Yes    Alcohol/week: 34.0 standard drinks    Types: 10 Cans of beer, 24 Standard drinks or equivalent per week    Comment: Beer occasional  . Drug use: Yes    Types: Marijuana  Comment: Occasionally.  . Sexual activity: Not on file    Comment: lives with husband and two kids, no job, pays out of pocket, was trying for medicaid  Lifestyle  . Physical activity:    Days per week: Not on file    Minutes per session: Not on file  . Stress: Not on file  Relationships  . Social connections:    Talks on phone: Not on file    Gets together: Not on file    Attends religious service: Not on file    Active member of club or organization: Not on file    Attends meetings of clubs or organizations: Not on file    Relationship status: Not on file  Other Topics Concern  . Not on file  Social History Narrative  . Not on file   Family History  Problem Relation Age of Onset  . Stroke Mother 86       brain aneurysm  . Heart disease  Father 52    ASSESSMENT Recent Results: The most recent result is correlated with 14 mg per week: Lab Results  Component Value Date   INR 1.0 (A) 11/24/2017   INR 2.7 05/20/2017   INR 1.1 04/25/2017    Anticoagulation Dosing: Description   Take 1 tablet of your lavender 2mg  strength warfarin tablet by mouth once-daily at 6PM, all days of week--EXCEPT on Mondays and Thursdays--take 1 and 1/2 tablets on Mondays and Thursdays.      INR today: Subtherapeutic  PLAN Weekly dose was increased by 14% to 16 mg per week  Patient Instructions  Patient instructed to take medications as defined in the Anti-coagulation Track section of this encounter.  Patient instructed to take today's dose.  Patient instructed to take 1 tablet of your lavender 2mg  strength warfarin tablet by mouth once-daily at 6PM, all days of week--EXCEPT on Mondays and Thursdays--take 1 and 1/2 tablets on Mondays and Thursdays.  Patient verbalized understanding of these instructions.     Patient advised to contact clinic or seek medical attention if signs/symptoms of bleeding or thromboembolism occur.  Patient verbalized understanding by repeating back information and was advised to contact me if further medication-related questions arise. Patient was also provided an information handout.  Follow-up Return in 2 weeks (on 12/08/2017) for Follow up INR at 0900h.  Pennie Banter, PharmD, CACP, CPP  15 minutes spent face-to-face with the patient during the encounter. 50% of time spent on education. 50% of time was spent on fingerstick point of care INR sample collection, processing, results determination, dose adjustment and extensive counseling regarding compliance--and being on warfarin with history of PE/LFVD--and the possible sequelae at each of the two extremes: Bleeding and risk of recurrence of thromboembolic disease.

## 2017-11-27 NOTE — Progress Notes (Signed)
INTERNAL MEDICINE TEACHING ATTENDING ADDENDUM - Sarah Simon M.D  Duration- indefinite, Indication- PE, factor V Leiden, INR- sub therapeutic. Agree with pharmacy recommendations as outlined in their note.

## 2017-12-08 ENCOUNTER — Ambulatory Visit: Payer: Self-pay

## 2017-12-15 ENCOUNTER — Ambulatory Visit: Payer: Self-pay | Admitting: Pharmacist

## 2018-01-27 ENCOUNTER — Encounter: Payer: Self-pay | Admitting: Internal Medicine

## 2018-03-23 ENCOUNTER — Encounter: Payer: Self-pay | Admitting: Pharmacist

## 2018-03-23 ENCOUNTER — Ambulatory Visit: Payer: Medicaid Other | Admitting: Pharmacist

## 2018-03-23 DIAGNOSIS — Z86711 Personal history of pulmonary embolism: Secondary | ICD-10-CM

## 2018-03-23 DIAGNOSIS — Z86718 Personal history of other venous thrombosis and embolism: Secondary | ICD-10-CM

## 2018-03-23 DIAGNOSIS — Z5181 Encounter for therapeutic drug level monitoring: Secondary | ICD-10-CM

## 2018-03-23 DIAGNOSIS — Z7901 Long term (current) use of anticoagulants: Secondary | ICD-10-CM | POA: Diagnosis not present

## 2018-03-23 LAB — POCT INR: INR: 1.1 — AB (ref 2.0–3.0)

## 2018-03-23 MED ORDER — WARFARIN SODIUM 2 MG PO TABS: 2.0000 mg | ORAL_TABLET | Freq: Every day | ORAL | 1 refills | Status: DC

## 2018-03-23 NOTE — Progress Notes (Signed)
Anticoagulation Management Sarah Simon is a 58 y.o. female who reports to the clinic for monitoring of warfarin treatment.    Indication: DVT , History of; PE, History of; Thrombophilia; long-term current use of anticoagulant--but non-compliant to instructions (has only been taking 1/2 of last recommended dose).   Duration: indefinite Supervising physician: Lenice Pressman, MD, PhD  Anticoagulation Clinic Visit History: Patient does not report signs/symptoms of bleeding or thromboembolism  Other recent changes: No diet, medications, lifestyle changes except as noted in patient findings.  Anticoagulation Episode Summary    Current INR goal:   2.0-3.0  TTR:   41.3 % (8 y)  Next INR check:   04/06/2018  INR from last check:     Weekly max warfarin dose:     Target end date:   Indefinite  INR check location:   Anticoagulation Clinic  Preferred lab:     Send INR reminders to:   [name]   Indications   History of pulmonary embolism (Resolved) [Z86.711] DVT (Resolved) [I82.409] Lifelong Coumadin therapy [Z79.01]       Comments:         Anticoagulation Care Providers    Provider Role Specialty Phone number   Bartholomew Crews, MD  Internal Medicine [phone]      No Known Allergies Prior to Admission medications   Medication Sig Start Date End Date Taking? Authorizing Provider  amLODipine (NORVASC) 5 MG tablet Take 1 tablet (5 mg total) by mouth daily. 05/20/17 05/20/18 Yes Rice, Resa Miner, MD  lisinopril-hydrochlorothiazide (PRINZIDE,ZESTORETIC) 20-25 MG tablet TAKE 1 TABLET BY MOUTH ONCE DAILY 06/24/17  Yes Thomasene Ripple, MD  warfarin (COUMADIN) 2 MG tablet Take 1 tablet (2 mg total) by mouth daily at 6 PM. 03/23/18  Yes Pennie Banter, RPH-CPP   Past Medical History:  Diagnosis Date  . Factor V Leiden mutation ([place])   . Hepatitis C carrier ([place])   . Hypertension 08/04/2006   On and off medications  Has cost problems for her medications  HTN without  complications     . Melanoma ([place])    unclear diagnosis, this is per pt report, no treatment or follow up known. per pt on left leg  . Pulmonary embolism [facility]) May 2003    On Lifelong coumidin Factor V leiden def  . Skin cancer    multiple chest and back   . Subarachnoid hemorrhage [facility])    Required surgery for clipping   . Suicide attempt [facility])    Social History   Socioeconomic History  . Marital status: Married    Spouse name: Not on file  . Number of children: Not on file  . Years of education: Not on file  . Highest education level: Not on file  Occupational History  . Not on file  Social Needs  . Financial resource strain: Not on file  . Food insecurity:    Worry: Not on file    Inability: Not on file  . Transportation needs:    Medical: Not on file    Non-medical: Not on file  Tobacco Use  . Smoking status: Former Smoker    Types: Cigarettes    Last attempt to quit: 04/23/1970    Years since quitting: 47.9  . Smokeless tobacco: Never Used  Substance and Sexual Activity  . Alcohol use: Yes    Alcohol/week: 34.0 standard drinks    Types: 10 Cans of beer, 24 Standard drinks or equivalent per week    Comment: Beer occasional  .  Drug use: Yes    Types: Marijuana    Comment: Occasionally.  . Sexual activity: Not on file    Comment: lives with husband and two kids, no job, pays out of pocket, was trying for medicaid  Lifestyle  . Physical activity:    Days per week: Not on file    Minutes per session: Not on file  . Stress: Not on file  Relationships  . Social connections:    Talks on phone: Not on file    Gets together: Not on file    Attends religious service: Not on file    Active member of club or organization: Not on file    Attends meetings of clubs or organizations: Not on file    Relationship status: Not on file  Other Topics Concern  . Not on file  Social History Narrative  . Not on file   Family History  Problem Relation Age of Onset  . Stroke  Mother 57       brain aneurysm  . Heart disease Father 42    ASSESSMENT Recent Results: The most recent result is correlated with 7 mg per week: Lab Results  Component Value Date   INR 1.1 (A) 03/23/2018   INR 1.0 (A) 11/24/2017   INR 2.7 05/20/2017    Anticoagulation Dosing: Description   Take 1 tablet of your lavender 2mg  strength warfarin tablet by mouth once-daily at 6PM.      INR today: Subtherapeutic  PLAN Weekly dose was increased to 14mg /wk.   Patient Instructions  Patient instructed to take medications as defined in the Anti-coagulation Track section of this encounter.  Patient instructed to take today's dose.  Patient instructed to take one (1) of your 2mg -strength lavender colored warfarin tablets by mouth, once-daily at [facility] each day.  Patient verbalized understanding of these instructions.     Patient advised to contact clinic or seek medical attention if signs/symptoms of bleeding or thromboembolism occur.  Patient verbalized understanding by repeating back information and was advised to contact me if further medication-related questions arise. Patient was also provided an information handout.  Follow-up Return in 2 weeks (on 04/06/2018) for Follow up INR at 0930.  Pennie Banter, PharmD, CPP  15 minutes spent face-to-face with the patient during the encounter. 50% of time spent on education, including signs/sx bleeding and clotting, as well as food and drug interactions with warfarin. 50% of time was spent on fingerprick POC INR sample collection,processing, results determination, and documentation in http://[email or web address].[email or web address]

## 2018-03-23 NOTE — Patient Instructions (Signed)
Patient instructed to take medications as defined in the Anti-coagulation Track section of this encounter.  Patient instructed to take today's dose.  Patient instructed to take one (1) of your 2mg -strength lavender colored warfarin tablets by mouth, once-daily at [facility] each day.  Patient verbalized understanding of these instructions.

## 2018-03-25 NOTE — Progress Notes (Signed)
INTERNAL MEDICINE TEACHING ATTENDING ADDENDUM  I agree with pharmacy recommendations as outlined in their note.   Alexander N [name], MD  

## 2018-03-31 ENCOUNTER — Encounter: Payer: Self-pay | Admitting: Internal Medicine

## 2018-04-01 ENCOUNTER — Other Ambulatory Visit: Payer: Self-pay | Admitting: Pharmacist

## 2018-04-01 DIAGNOSIS — Z86718 Personal history of other venous thrombosis and embolism: Secondary | ICD-10-CM

## 2018-04-01 DIAGNOSIS — Z7901 Long term (current) use of anticoagulants: Secondary | ICD-10-CM

## 2018-04-02 ENCOUNTER — Telehealth: Payer: Self-pay | Admitting: *Deleted

## 2018-04-02 NOTE — Telephone Encounter (Signed)
Received phone call from patient about Coumadin Clinic appointment for 0323/20. I told her that due to [facility] Virus restrictions she would be receiving a letter in the mail instructing her to go to a [facility] site to have this collected.  She voiced understanding.  Joellyn Haff, PBT 04/02/18 3:45pm

## 2018-04-03 ENCOUNTER — Other Ambulatory Visit: Payer: Self-pay | Admitting: *Deleted

## 2018-04-03 DIAGNOSIS — Z7901 Long term (current) use of anticoagulants: Secondary | ICD-10-CM

## 2018-04-03 DIAGNOSIS — Z86718 Personal history of other venous thrombosis and embolism: Secondary | ICD-10-CM

## 2018-04-06 ENCOUNTER — Ambulatory Visit: Payer: Medicaid Other

## 2018-07-15 ENCOUNTER — Encounter: Payer: Medicaid Other | Admitting: Internal Medicine

## 2018-07-29 ENCOUNTER — Encounter: Payer: Self-pay | Admitting: Internal Medicine

## 2018-07-29 ENCOUNTER — Encounter: Payer: Medicaid Other | Admitting: Internal Medicine

## 2018-11-12 ENCOUNTER — Other Ambulatory Visit: Payer: Self-pay

## 2018-11-12 ENCOUNTER — Encounter (HOSPITAL_COMMUNITY): Payer: Self-pay | Admitting: Emergency Medicine

## 2018-11-12 ENCOUNTER — Emergency Department (HOSPITAL_COMMUNITY)
Admission: EM | Admit: 2018-11-12 | Discharge: 2018-11-12 | Payer: Medicaid Other | Attending: Emergency Medicine | Admitting: Emergency Medicine

## 2018-11-12 ENCOUNTER — Emergency Department (HOSPITAL_COMMUNITY): Payer: Medicaid Other

## 2018-11-12 DIAGNOSIS — S199XXA Unspecified injury of neck, initial encounter: Secondary | ICD-10-CM | POA: Diagnosis not present

## 2018-11-12 DIAGNOSIS — T07XXXA Unspecified multiple injuries, initial encounter: Secondary | ICD-10-CM

## 2018-11-12 DIAGNOSIS — D6851 Activated protein C resistance: Secondary | ICD-10-CM | POA: Insufficient documentation

## 2018-11-12 DIAGNOSIS — S0181XA Laceration without foreign body of other part of head, initial encounter: Secondary | ICD-10-CM

## 2018-11-12 DIAGNOSIS — S0993XA Unspecified injury of face, initial encounter: Secondary | ICD-10-CM | POA: Diagnosis not present

## 2018-11-12 DIAGNOSIS — Z7901 Long term (current) use of anticoagulants: Secondary | ICD-10-CM | POA: Insufficient documentation

## 2018-11-12 DIAGNOSIS — S20311A Abrasion of right front wall of thorax, initial encounter: Secondary | ICD-10-CM | POA: Diagnosis not present

## 2018-11-12 DIAGNOSIS — Z23 Encounter for immunization: Secondary | ICD-10-CM | POA: Diagnosis not present

## 2018-11-12 DIAGNOSIS — I2699 Other pulmonary embolism without acute cor pulmonale: Secondary | ICD-10-CM | POA: Diagnosis not present

## 2018-11-12 DIAGNOSIS — Y9289 Other specified places as the place of occurrence of the external cause: Secondary | ICD-10-CM | POA: Diagnosis not present

## 2018-11-12 DIAGNOSIS — S0083XA Contusion of other part of head, initial encounter: Secondary | ICD-10-CM | POA: Diagnosis not present

## 2018-11-12 DIAGNOSIS — R52 Pain, unspecified: Secondary | ICD-10-CM | POA: Diagnosis not present

## 2018-11-12 DIAGNOSIS — Z85828 Personal history of other malignant neoplasm of skin: Secondary | ICD-10-CM | POA: Insufficient documentation

## 2018-11-12 DIAGNOSIS — S0990XA Unspecified injury of head, initial encounter: Secondary | ICD-10-CM | POA: Diagnosis not present

## 2018-11-12 DIAGNOSIS — Y999 Unspecified external cause status: Secondary | ICD-10-CM | POA: Diagnosis not present

## 2018-11-12 DIAGNOSIS — Y9389 Activity, other specified: Secondary | ICD-10-CM | POA: Insufficient documentation

## 2018-11-12 DIAGNOSIS — I10 Essential (primary) hypertension: Secondary | ICD-10-CM | POA: Insufficient documentation

## 2018-11-12 DIAGNOSIS — D688 Other specified coagulation defects: Secondary | ICD-10-CM | POA: Diagnosis not present

## 2018-11-12 DIAGNOSIS — W19XXXA Unspecified fall, initial encounter: Secondary | ICD-10-CM

## 2018-11-12 DIAGNOSIS — Z79899 Other long term (current) drug therapy: Secondary | ICD-10-CM | POA: Diagnosis not present

## 2018-11-12 DIAGNOSIS — R791 Abnormal coagulation profile: Secondary | ICD-10-CM

## 2018-11-12 DIAGNOSIS — S01111A Laceration without foreign body of right eyelid and periocular area, initial encounter: Secondary | ICD-10-CM | POA: Insufficient documentation

## 2018-11-12 DIAGNOSIS — Z87891 Personal history of nicotine dependence: Secondary | ICD-10-CM | POA: Insufficient documentation

## 2018-11-12 DIAGNOSIS — E876 Hypokalemia: Secondary | ICD-10-CM | POA: Diagnosis not present

## 2018-11-12 DIAGNOSIS — S80212A Abrasion, left knee, initial encounter: Secondary | ICD-10-CM | POA: Diagnosis not present

## 2018-11-12 DIAGNOSIS — E1165 Type 2 diabetes mellitus with hyperglycemia: Secondary | ICD-10-CM | POA: Diagnosis not present

## 2018-11-12 LAB — BASIC METABOLIC PANEL
Anion gap: 14 (ref 5–15)
BUN: 5 mg/dL — ABNORMAL LOW (ref 6–20)
CO2: 24 mmol/L (ref 22–32)
Calcium: 8.9 mg/dL (ref 8.9–10.3)
Chloride: 107 mmol/L (ref 98–111)
Creatinine, Ser: 0.48 mg/dL (ref 0.44–1.00)
GFR calc Af Amer: >60 mL/min (ref >60)
GFR calc non Af Amer: >60 mL/min (ref >60)
Glucose, Bld: 262 mg/dL — ABNORMAL HIGH (ref 70–99)
Potassium: 2.9 mmol/L — ABNORMAL LOW (ref 3.5–5.1)
Sodium: 145 mmol/L (ref 135–145)

## 2018-11-12 LAB — CBC
HCT: 43.1 % (ref 36.0–46.0)
Hemoglobin: 14.8 g/dL (ref 12.0–15.0)
MCH: 35.8 pg — ABNORMAL HIGH (ref 26.0–34.0)
MCHC: 34.3 g/dL (ref 30.0–36.0)
MCV: 104.4 fL — ABNORMAL HIGH (ref 80.0–100.0)
Platelets: 304 10*3/uL (ref 150–400)
RBC: 4.13 MIL/uL (ref 3.87–5.11)
RDW: 12 % (ref 11.5–15.5)
WBC: 9.5 10*3/uL (ref 4.0–10.5)
nRBC: 0.3 % — ABNORMAL HIGH (ref 0.0–0.2)

## 2018-11-12 LAB — PROTIME-INR
INR: 1.1 (ref 0.8–1.2)
Prothrombin Time: 13.7 seconds (ref 11.4–15.2)

## 2018-11-12 MED ORDER — POTASSIUM CHLORIDE CRYS ER 20 MEQ PO TBCR: 20.0000 meq | EXTENDED_RELEASE_TABLET | Freq: Two times a day (BID) | ORAL | 0 refills | Status: DC

## 2018-11-12 MED ORDER — LIDOCAINE-EPINEPHRINE (PF) 2 %-1:200000 IJ SOLN
10.0000 mL | Freq: Once | INTRAMUSCULAR | Status: AC
  Administered 2018-11-12: 10 mL
  Filled 2018-11-12: qty 20

## 2018-11-12 MED ORDER — TETANUS-DIPHTH-ACELL PERTUSSIS 5-2.5-18.5 LF-MCG/0.5 IM SUSP
0.5000 mL | Freq: Once | INTRAMUSCULAR | Status: AC
  Administered 2018-11-12: 0.5 mL via INTRAMUSCULAR
  Filled 2018-11-12: qty 0.5

## 2018-11-12 MED ORDER — POTASSIUM CHLORIDE CRYS ER 20 MEQ PO TBCR
40.0000 meq | EXTENDED_RELEASE_TABLET | Freq: Once | ORAL | Status: AC
  Administered 2018-11-12: 40 meq via ORAL
  Filled 2018-11-12: qty 2

## 2018-11-12 NOTE — ED Notes (Signed)
Pt appears to be a poor historian, when asked about past hx, states she is "OK". When asked if she takes any medications daily she stated no, however told provider that she does take blood thinners.  She did state that she has trouble w/ her memory.  RN found pt w/o any of her vital monitoring equipment on after it had been applied.  Pt was educated on the importance of keeping it on for her safety.  She agreed and it was reapplied.

## 2018-11-12 NOTE — ED Notes (Signed)
Patient transported to CT 

## 2018-11-12 NOTE — ED Triage Notes (Signed)
Pt fell getting out of squad car, face hitting the pavement.  Laceration above right eye, abrasion to left knee.  No LOC, no other complaints.  ETOH on board.

## 2018-11-12 NOTE — ED Provider Notes (Signed)
[facility] EMERGENCY DEPARTMENT Provider Note  CSN: [id number] Arrival date & time: 11/12/18 0119  Chief Complaint(s) Fall  HPI Sarah Simon is a 58 y.o. 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 She landed on her left knee, right chest and face. Sustained right eyebrow laceration. Complains of right face and breast pain. No headache, neck pain, back pain, chest pain, abd pain. Unsure of tetanus status.   Admits to etoh tonight.  HPI  Past Medical History Past Medical History:  Diagnosis Date   Factor V Leiden mutation ([place])    Hepatitis C carrier ([place])    Hypertension 08/04/2006   On and off medications  Has cost problems for her medications  HTN without complications      Melanoma ([place])    unclear diagnosis, this is per pt report, no treatment or follow up known. per pt on left leg   Pulmonary embolism [facility]) May 2003    On Lifelong coumidin Factor V leiden def   Skin cancer    multiple chest and back    Subarachnoid hemorrhage [facility])    Required surgery for clipping    Suicide attempt [facility])    Patient Active Problem List   Diagnosis Date Noted   Leg skin lesion, left 12/11/2015   Rash and nonspecific skin eruption 03/16/2015   Osteoarthritis 04/27/2014   Liver cirrhosis with liver nodules 12/01/2013   History of melanoma 06/06/2011   Alcohol abuse 02/07/2011   Lifelong Coumadin therapy 02/03/2010   Generalized anxiety disorder 09/13/2009   Depression 06/07/2009   Chronic hepatitis C without hepatic coma ([place]) 08/04/2008   Hyperlipidemia 07/29/2008   TRANSAMINASES, SERUM, ELEVATED 05/25/2008   Hypertension 08/04/2006   Home Medication(s) Prior to Admission medications   Medication Sig Start Date End Date Taking? Authorizing Provider  amLODipine (NORVASC) 5 MG tablet Take 1 tablet (5 mg total) by mouth daily.  05/20/17 05/20/18  Collier Salina, MD  lisinopril-hydrochlorothiazide (PRINZIDE,ZESTORETIC) 20-25 MG tablet TAKE 1 TABLET BY MOUTH ONCE DAILY 06/24/17   Nedrud, Larena Glassman, MD  potassium chloride SA (KLOR-CON) 20 MEQ tablet Take 1 tablet (20 mEq total) by mouth 2 (two) times daily for 14 days. 11/12/18 11/26/18  Fatima Blank, MD  warfarin (COUMADIN) 2 MG tablet Take 1 tablet (2 mg total) by mouth daily at 6 PM. 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   Family History Family History  Problem Relation Age of Onset  Stroke Mother 32       brain aneurysm   Heart disease Father 72    Social History Social History   Tobacco Use   Smoking status: Former Smoker    Types: Cigarettes    Quit date: 04/23/1970    Years since quitting: 48.5   Smokeless tobacco: Never Used  Substance Use Topics   Alcohol use: Yes    Alcohol/week: 34.0 standard drinks    Types: 10 Cans of beer, 24 Standard drinks or equivalent per week    Comment: Beer occasional   Drug use: Yes    Types: Marijuana    Comment: Occasionally.   Allergies Patient has no known allergies.  Review of Systems Review of Systems All other systems are reviewed and are negative for acute change except as noted in the HPI  Physical Exam Vital Signs  I have reviewed the triage vital signs BP (!) 165/106 (BP Location: Right Arm)    Temp 97.8 F (36.6 C) (Oral)    Ht 5' 7.25" (1.708 m)    Wt 63.5 kg    SpO2 96%    BMI 21.76 kg/m   Physical Exam Constitutional:      General: She is not in acute distress.    Appearance: She is well-developed. She is not diaphoretic.  HENT:     Head: Normocephalic. Contusion and laceration present. No raccoon eyes or  Battle's sign.      Right Ear: External ear normal.     Left Ear: External ear normal.     Nose: Nose normal.  Eyes:     General: No scleral icterus.       Right eye: No discharge.        Left eye: No discharge.     Conjunctiva/sclera: Conjunctivae normal.     Pupils: Pupils are equal, round, and reactive to light.  Neck:     Musculoskeletal: Normal range of motion and neck supple.  Cardiovascular:     Rate and Rhythm: Normal rate and regular rhythm.     Pulses:          Radial pulses are 2+ on the right side and 2+ on the left side.       Dorsalis pedis pulses are 2+ on the right side and 2+ on the left side.     Heart sounds: Normal heart sounds. No murmur. No friction rub. No gallop.   Pulmonary:     Effort: Pulmonary effort is normal. No respiratory distress.     Breath sounds: Normal breath sounds. No stridor. No wheezing.  Abdominal:     General: There is no distension.     Palpations: Abdomen is soft.     Tenderness: There is no abdominal tenderness.  Musculoskeletal:        General: No tenderness.     Cervical back: She exhibits no bony tenderness.     Thoracic back: She exhibits no bony tenderness.     Lumbar back: She exhibits no bony tenderness.     Comments: Clavicles stable. Chest stable to AP/Lat compression. Pelvis stable to Lat compression. No obvious extremity deformity. No chest or abdominal wall contusion.  Skin:    General: Skin is warm and dry.     Findings: Abrasion present. No erythema or rash.       Neurological:     Mental Status: She is alert and oriented to person, place, and time.     Comments: Moving all extremities     ED  Results and Treatments Labs (all labs ordered are listed, but only abnormal results are displayed) Labs Reviewed  CBC - Abnormal; Notable for the following components:      Result Value   MCV 104.4 (*)    MCH 35.8 (*)    nRBC 0.3 (*)    All other components within normal limits  BASIC METABOLIC PANEL - Abnormal;  Notable for the following components:   Potassium 2.9 (*)    Glucose, Bld 262 (*)    BUN 5 (*)    All other components within normal limits  PROTIME-INR                                                                                                                         EKG  EKG Interpretation  Date/Time:    Ventricular Rate:    PR Interval:    QRS Duration:   QT Interval:    QTC Calculation:   R Axis:     Text Interpretation:        Radiology Ct Head Wo Contrast  Result Date: 11/12/2018 CLINICAL DATA:  58 year old female with head trauma. EXAM: CT HEAD WITHOUT CONTRAST CT MAXILLOFACIAL WITHOUT CONTRAST CT CERVICAL SPINE WITHOUT CONTRAST TECHNIQUE: Multidetector CT imaging of the head, cervical spine, and maxillofacial structures were performed using the standard protocol without intravenous contrast. Multiplanar CT image reconstructions of the cervical spine and maxillofacial structures were also generated. COMPARISON:  Head CT report dated 11/22/1999. The images are not available for direct comparison. FINDINGS: CT HEAD FINDINGS Brain: There is mild age-related atrophy and chronic microvascular ischemic changes. Postsurgical changes of the left frontal lobe with an area of encephalomalacia along the inferior left frontal lobe and anterior left temporal lobe. There is associated mild ex vacuo dilatation of the frontal horn of the left lateral ventricle. There is no acute intracranial hemorrhage. No mass effect or midline shift. No extra-axial fluid collection. Vascular: Surgical clips in the region of the supraclinoid ICA on the left likely related to prior aneurysmal clip. Skull: Left frontotemporal craniotomy. No acute calvarial pathology. Other: Small right periorbital contusion. CT MAXILLOFACIAL FINDINGS Osseous: No acute fracture. No mandibular subluxation. Orbits: The globes and retro-orbital fat are preserved. Sinuses: Clear. Soft tissues: Right periorbital contusion. CT CERVICAL  SPINE FINDINGS Alignment: No acute subluxation. There is grade 1 C3-C4 anterolisthesis. Mild reversal of normal cervical lordosis at C4-C6, likely related to degenerative changes. Skull base and vertebrae: No acute fracture. Soft tissues and spinal canal: No prevertebral fluid or swelling. No visible canal hematoma. Disc levels: Multilevel degenerative changes most prominent at C4-C7. Multilevel facet arthropathy. Upper chest: Negative. Other: None IMPRESSION: 1. No acute intracranial hemorrhage. 2. No acute/traumatic cervical spine pathology. 3. No acute facial bone fractures. Electronically Signed   By: Anner Crete M.D.   On: 11/12/2018 03:30   Ct Cervical Spine Wo Contrast  Result Date: 11/12/2018 CLINICAL DATA:  58 year old female with head trauma. EXAM: CT HEAD WITHOUT CONTRAST CT MAXILLOFACIAL WITHOUT  CONTRAST CT CERVICAL SPINE WITHOUT CONTRAST TECHNIQUE: Multidetector CT imaging of the head, cervical spine, and maxillofacial structures were performed using the standard protocol without intravenous contrast. Multiplanar CT image reconstructions of the cervical spine and maxillofacial structures were also generated. COMPARISON:  Head CT report dated 11/22/1999. The images are not available for direct comparison. FINDINGS: CT HEAD FINDINGS Brain: There is mild age-related atrophy and chronic microvascular ischemic changes. Postsurgical changes of the left frontal lobe with an area of encephalomalacia along the inferior left frontal lobe and anterior left temporal lobe. There is associated mild ex vacuo dilatation of the frontal horn of the left lateral ventricle. There is no acute intracranial hemorrhage. No mass effect or midline shift. No extra-axial fluid collection. Vascular: Surgical clips in the region of the supraclinoid ICA on the left likely related to prior aneurysmal clip. Skull: Left frontotemporal craniotomy. No acute calvarial pathology. Other: Small right periorbital contusion. CT  MAXILLOFACIAL FINDINGS Osseous: No acute fracture. No mandibular subluxation. Orbits: The globes and retro-orbital fat are preserved. Sinuses: Clear. Soft tissues: Right periorbital contusion. CT CERVICAL SPINE FINDINGS Alignment: No acute subluxation. There is grade 1 C3-C4 anterolisthesis. Mild reversal of normal cervical lordosis at C4-C6, likely related to degenerative changes. Skull base and vertebrae: No acute fracture. Soft tissues and spinal canal: No prevertebral fluid or swelling. No visible canal hematoma. Disc levels: Multilevel degenerative changes most prominent at C4-C7. Multilevel facet arthropathy. Upper chest: Negative. Other: None IMPRESSION: 1. No acute intracranial hemorrhage. 2. No acute/traumatic cervical spine pathology. 3. No acute facial bone fractures. Electronically Signed   By: Anner Crete M.D.   On: 11/12/2018 03:30   Ct Maxillofacial Wo Contrast  Result Date: 11/12/2018 CLINICAL DATA:  58 year old female with head trauma. EXAM: CT HEAD WITHOUT CONTRAST CT MAXILLOFACIAL WITHOUT CONTRAST CT CERVICAL SPINE WITHOUT CONTRAST TECHNIQUE: Multidetector CT imaging of the head, cervical spine, and maxillofacial structures were performed using the standard protocol without intravenous contrast. Multiplanar CT image reconstructions of the cervical spine and maxillofacial structures were also generated. COMPARISON:  Head CT report dated 11/22/1999. The images are not available for direct comparison. FINDINGS: CT HEAD FINDINGS Brain: There is mild age-related atrophy and chronic microvascular ischemic changes. Postsurgical changes of the left frontal lobe with an area of encephalomalacia along the inferior left frontal lobe and anterior left temporal lobe. There is associated mild ex vacuo dilatation of the frontal horn of the left lateral ventricle. There is no acute intracranial hemorrhage. No mass effect or midline shift. No extra-axial fluid collection. Vascular: Surgical clips in the  region of the supraclinoid ICA on the left likely related to prior aneurysmal clip. Skull: Left frontotemporal craniotomy. No acute calvarial pathology. Other: Small right periorbital contusion. CT MAXILLOFACIAL FINDINGS Osseous: No acute fracture. No mandibular subluxation. Orbits: The globes and retro-orbital fat are preserved. Sinuses: Clear. Soft tissues: Right periorbital contusion. CT CERVICAL SPINE FINDINGS Alignment: No acute subluxation. There is grade 1 C3-C4 anterolisthesis. Mild reversal of normal cervical lordosis at C4-C6, likely related to degenerative changes. Skull base and vertebrae: No acute fracture. Soft tissues and spinal canal: No prevertebral fluid or swelling. No visible canal hematoma. Disc levels: Multilevel degenerative changes most prominent at C4-C7. Multilevel facet arthropathy. Upper chest: Negative. Other: None IMPRESSION: 1. No acute intracranial hemorrhage. 2. No acute/traumatic cervical spine pathology. 3. No acute facial bone fractures. Electronically Signed   By: Anner Crete M.D.   On: 11/12/2018 03:30    Pertinent labs & imaging results that were available during  my care of the patient were reviewed by me and considered in my medical decision making (see chart for details).  Medications Ordered in ED Medications  potassium chloride SA (KLOR-CON) CR tablet 40 mEq (has no administration in time range)  Tdap (BOOSTRIX) injection 0.5 mL (0.5 mLs Intramuscular Given 11/12/18 0246)  lidocaine-EPINEPHrine (XYLOCAINE W/EPI) 2 %-1:200000 (PF) injection 10 mL (10 mLs Infiltration Given 11/12/18 0248)                                                                                                                                    Procedures .Marland KitchenLaceration Repair  Date/Time: 11/12/2018 4:22 AM Performed by: Fatima Blank, MD Authorized by: Fatima Blank, MD   Consent:    Consent obtained:  Verbal   Consent given by:  Patient   Risks discussed:   Poor cosmetic result, poor wound healing and need for additional repair   Alternatives discussed:  Delayed treatment Anesthesia (see MAR for exact dosages):    Anesthesia method:  Local infiltration   Local anesthetic:  Lidocaine 2% WITH epi Laceration details:    Location:  Face   Face location:  R eyebrow   Length (cm):  4   Depth (mm):  4 Repair type:    Repair type:  Intermediate Pre-procedure details:    Preparation:  Patient was prepped and draped in usual sterile fashion and imaging obtained to evaluate for foreign bodies Exploration:    Hemostasis achieved with:  Direct pressure   Wound exploration: wound explored through full range of motion and entire depth of wound probed and visualized     Wound extent: no fascia violation noted, no foreign bodies/material noted and no vascular damage noted     Contaminated: no   Treatment:    Area cleansed with:  Betadine   Amount of cleaning:  Extensive   Irrigation solution:  Sterile saline   Irrigation volume:  1000cc   Irrigation method:  Syringe Skin repair:    Repair method:  Sutures   Suture size:  5-0   Suture material:  Fast-absorbing gut   Suture technique:  Running locked   Number of sutures:  13 Approximation:    Approximation:  Close Post-procedure details:    Dressing:  Antibiotic ointment and non-adherent dressing   Patient tolerance of procedure:  Tolerated well, no immediate complications    (including critical care time)  Medical Decision Making / ED Course I have reviewed the nursing notes for this encounter and the patient's prior records (if available in EHR or on provided paperwork).   Arshia A Stumbo was evaluated in Emergency Department on 11/12/2018 for the symptoms described in the history of present illness. She was evaluated in the context of the global COVID-19 pandemic, which necessitated consideration that the patient might be at risk for infection with the SARS-CoV-2 virus that causes  COVID-19. Institutional protocols and algorithms that pertain to the evaluation  of patients at risk for COVID-19 are in a state of rapid change based on information released by regulatory bodies including the CDC and federal and state organizations. These policies and algorithms were followed during the patient's care in the ED.  Mechanical fall resulting in facial trauma. EtOH on board. Patient anticoagulated on Coumadin. CT head, face, and cervical spine negative Labs with hypokalemia and subtherapeutic INR.  Wound irrigated and closed as above. Tetanus updated.  The patient appears reasonably screened and/or stabilized for discharge and I doubt any other medical condition or other [facility] requiring further screening, evaluation, or treatment in the ED at this time prior to discharge.  The patient is safe for discharge with strict return precautions.       Final Clinical Impression(s) / ED Diagnoses Final diagnoses:  Fall, initial encounter  Facial laceration, initial encounter  Contusion of face, initial encounter  Multiple abrasions  Hypokalemia  Subtherapeutic international normalized ratio (INR)     The patient appears reasonably screened and/or stabilized for discharge and I doubt any other medical condition or other [facility] requiring further screening, evaluation, or treatment in the ED at this time prior to discharge.  Disposition: Discharge  Condition: Good  I have discussed the results, Dx and Tx plan with the patient who expressed understanding and agree(s) with the plan. Discharge instructions discussed at great length. The patient was given strict return precautions who verbalized understanding of the instructions. No further questions at time of discharge.    ED Discharge Orders         Ordered    potassium chloride SA (KLOR-CON) 20 MEQ tablet  2 times daily     11/12/18 0557           Follow Up: Primary care provider  Schedule an appointment as soon as  possible for a visit  in 1-2 weeks to recheck INR and potassium levels     This chart was dictated using voice recognition software.  Despite best efforts to proofread,  errors can occur which can change the documentation meaning.   Fatima Blank, MD 11/12/18 [phone]

## 2019-06-07 ENCOUNTER — Ambulatory Visit: Payer: Medicaid Other

## 2019-06-07 ENCOUNTER — Encounter: Payer: Medicaid Other | Admitting: Internal Medicine

## 2019-06-07 NOTE — Progress Notes (Deleted)
CC: ***  HPI:  [name] is a 59 y.o.  with a PMH listed below presenting for ***   Please see A&P for status of the patient's chronic medical conditions  Past Medical History:  Diagnosis Date  . Factor V Leiden mutation ([place])   . Hepatitis C carrier ([place])   . Hypertension 08/04/2006   On and off medications  Has cost problems for her medications  HTN without complications     . Melanoma ([place])    unclear diagnosis, this is per pt report, no treatment or follow up known. per pt on left leg  . Pulmonary embolism [facility]) May 2003    On Lifelong coumidin Factor V leiden def  . Skin cancer    multiple chest and back   . Subarachnoid hemorrhage [facility])    Required surgery for clipping   . Suicide attempt [facility])    Review of Systems: Refer to history of present illness and assessment and plans for pertinent review of systems, all others reviewed and negative.  Physical Exam:  There were no vitals filed for this visit. *** Physical Exam  Social History   Socioeconomic History  . Marital status: Married    Spouse name: Not on file  . Number of children: Not on file  . Years of education: Not on file  . Highest education level: Not on file  Occupational History  . Not on file  Tobacco Use  . Smoking status: Former Smoker    Types: Cigarettes    Quit date: 04/23/1970    Years since quitting: 49.1  . Smokeless tobacco: Never Used  Substance and Sexual Activity  . Alcohol use: Yes    Alcohol/week: 34.0 standard drinks    Types: 10 Cans of beer, 24 Standard drinks or equivalent per week    Comment: Beer occasional  . Drug use: Yes    Types: Marijuana    Comment: Occasionally.  . Sexual activity: Not on file    Comment: lives with husband and two kids, no job, pays out of pocket, was trying for medicaid  Other Topics Concern  . Not on file  Social History Narrative  . Not on file   Social Determinants of Health   Financial Resource Strain:   . Difficulty of  Paying Living Expenses:   Food Insecurity:   . Worried About Charity fundraiser in the Last Year:   . Arboriculturist in the Last Year:   Transportation Needs:   . Film/video editor (Medical):   Marland Kitchen Lack of Transportation (Non-Medical):   Physical Activity:   . Days of Exercise per Week:   . Minutes of Exercise per Session:   Stress:   . Feeling of Stress :   Social Connections:   . Frequency of Communication with Friends and Family:   . Frequency of Social Gatherings with Friends and Family:   . Attends Religious Services:   . Active Member of Clubs or Organizations:   . Attends Archivist Meetings:   Marland Kitchen Marital Status:   Intimate Partner Violence:   . Fear of Current or Ex-Partner:   . Emotionally Abused:   Marland Kitchen Physically Abused:   . Sexually Abused:    *** Family History  Problem Relation Age of Onset  . Stroke Mother 91       brain aneurysm  . Heart disease Father 15    Assessment & Plan:   See Encounters Tab for problem based charting.  Patient discussed with Dr. Illa Level. [name]","[name]","[name]","[name]","[name]","[name]"}

## 2019-07-15 DIAGNOSIS — Z419 Encounter for procedure for purposes other than remedying health state, unspecified: Secondary | ICD-10-CM | POA: Diagnosis not present

## 2019-08-15 DIAGNOSIS — Z419 Encounter for procedure for purposes other than remedying health state, unspecified: Secondary | ICD-10-CM | POA: Diagnosis not present

## 2019-09-15 DIAGNOSIS — Z419 Encounter for procedure for purposes other than remedying health state, unspecified: Secondary | ICD-10-CM | POA: Diagnosis not present

## 2019-10-15 DIAGNOSIS — Z419 Encounter for procedure for purposes other than remedying health state, unspecified: Secondary | ICD-10-CM | POA: Diagnosis not present

## 2020-02-14 NOTE — Progress Notes (Incomplete)
   CC: ***  HPI:  [name] is a 60 y.o. female with history as below presenting for ***. Please refer to problem based charting for further details of assessment and plan of current problem and chronic medical conditions.  Acute problem: ***   Schedule f/u with PCP?  HTN .FLOWAMB[5  *** Denies chest pain, palpitations, dizziness, headaches, LOC***.  Meds of amlodipine 5mg  daily, lisinopril-hctz 20-25mg . Hx of nonadherence due to cost and lack of follow up.  Cirrhosis Last imaging was a MRI in 2016 without [place], there was a 55mm lesion in R hepatic lobe that was thought to represent dysplasic nodule or regenerating nodule. No enhancing lesions. No HCC surveillance since then.  Hx of PE and favtor 5 Leiden deficiency Has been on warfarin wince 2003  Past Medical History:  Diagnosis Date  . Factor V Leiden mutation ([place])   . Hepatitis C carrier ([place])   . Hypertension 08/04/2006   On and off medications  Has cost problems for her medications  HTN without complications     . Melanoma ([place])    unclear diagnosis, this is per pt report, no treatment or follow up known. per pt on left leg  . Pulmonary embolism [facility]) May 2003    On Lifelong coumidin Factor V leiden def  . Skin cancer    multiple chest and back   . Subarachnoid hemorrhage [facility])    Required surgery for clipping   . Suicide attempt ([place])    Review of Systems:   ROS ***  Physical Exam: There were no vitals filed for this visit. Constitutional: no acute distress Head: atraumatic ENT: external ears normal Cardiovascular: regular rate and rhythm, normal heart sounds Pulmonary: effort normal, normal breath sounds bilaterally Abdominal: flat, nontender, no rebound tenderness, bowel sounds normal Musculoskeletal: *** Skin: warm and dry Neurological: alert, no focal deficit Psychiatric: normal mood and affect  Assessment & Plan:   See Encounters Tab for problem based charting.  Patient  {GC/GE:[id number]::"discussed with","seen with"} Dr. {NAMES:[id number]::"Butcher","Guilloud","[name]","[name]","[name]","[name]","[name]"}

## 2020-02-15 ENCOUNTER — Telehealth: Payer: Self-pay | Admitting: *Deleted

## 2020-02-15 ENCOUNTER — Encounter: Payer: Medicaid Other | Admitting: Student

## 2020-02-15 NOTE — Telephone Encounter (Signed)
Called patient,unable to leave message,mail box is full. Patient did not keep today's office visit,need to reschedule.

## 2020-03-14 DIAGNOSIS — Z419 Encounter for procedure for purposes other than remedying health state, unspecified: Secondary | ICD-10-CM | POA: Diagnosis not present

## 2020-04-12 ENCOUNTER — Encounter: Payer: Medicaid Other | Admitting: Internal Medicine

## 2020-04-14 DIAGNOSIS — Z419 Encounter for procedure for purposes other than remedying health state, unspecified: Secondary | ICD-10-CM | POA: Diagnosis not present

## 2020-04-24 ENCOUNTER — Encounter: Payer: Medicaid Other | Admitting: Internal Medicine

## 2020-04-24 ENCOUNTER — Telehealth: Payer: Self-pay | Admitting: *Deleted

## 2020-04-24 NOTE — Telephone Encounter (Signed)
Call placed to patient for today's missed appt. No answer. Left message on VM requesting return call.

## 2020-05-04 ENCOUNTER — Telehealth: Payer: Self-pay

## 2020-05-04 NOTE — Patient Outreach (Signed)
Care Coordination  05/04/2020  TZIPORAH KNOKE [birth year] [id number]   Medicaid Managed Care   Unsuccessful Outreach Note  05/04/2020 Name: DANETTE WEINFELD MRN: [id number] DOB: [birth year]  Referred by: Asencion Noble, MD Reason for referral : High Risk Managed Medicaid (MM Screen Unsuccessful Telephone Outreach)   An unsuccessful telephone outreach was attempted today. The patient was referred to the case management team for assistance with care management and care coordination.   Follow Up Plan: The care management team will reach out to the patient again over the next 7 days.   Mickel Fuchs, BSW, [place]  High Risk Managed Medicaid Team

## 2020-05-04 NOTE — Patient Instructions (Signed)
Visit Information  Ms. Sarah Simon  - as a part of your Medicaid benefit, you are eligible for care management and care coordination services at no cost or copay. I was unable to reach you by phone today but would be happy to help you with your health related needs. Please feel free to call me @ [phone].   A member of the Managed Medicaid care management team will reach out to you again over the next 7 days.   Mickel Fuchs, BSW, [place]  High Risk Managed Medicaid Team

## 2020-05-14 DIAGNOSIS — Z419 Encounter for procedure for purposes other than remedying health state, unspecified: Secondary | ICD-10-CM | POA: Diagnosis not present

## 2020-05-15 ENCOUNTER — Telehealth: Payer: Self-pay

## 2020-05-15 NOTE — Patient Instructions (Signed)
Visit Information  Ms. Sarah Simon  - as a part of your Medicaid benefit, you are eligible for care management and care coordination services at no cost or copay. I was unable to reach you by phone today but would be happy to help you with your health related needs. Please feel free to call me @ [phone].   A member of the Managed Medicaid care management team will reach out to you again over the next 7 days.   Alyiah Ulloa, BSW, MHA [facility]  [place]  High Risk Managed Medicaid Team    

## 2020-05-15 NOTE — Patient Outreach (Signed)
Care Coordination  05/15/2020  Sarah Simon 1960-02-02 [id number]   Medicaid Managed Care   Unsuccessful Outreach Note  05/15/2020 Name: Sarah Simon MRN: [id number] DOB: 1960-12-27  Referred by: Asencion Noble, MD Reason for referral : High Risk Managed Medicaid (MM screen unsuccessful Telephone outreach)   A second unsuccessful telephone outreach was attempted today. The patient was referred to the case management team for assistance with care management and care coordination.   Follow Up Plan: The care management team will reach out to the patient again over the next 7 days.   Mickel Fuchs, BSW, [place]  High Risk Managed Medicaid Team

## 2020-05-22 ENCOUNTER — Encounter: Payer: Medicaid Other | Admitting: Internal Medicine

## 2020-05-22 NOTE — Progress Notes (Deleted)
   CC: ***  HPI:  Ms.Sherol A Denherder is a 60 y.o.   Past Medical History:  Diagnosis Date  . Factor V Leiden mutation ([place])   . Hepatitis C carrier ([place])   . Hypertension 08/04/2006   On and off medications  Has cost problems for her medications  HTN without complications     . Melanoma ([place])    unclear diagnosis, this is per pt report, no treatment or follow up known. per pt on left leg  . Pulmonary embolism [facility]) May 2003    On Lifelong coumidin Factor V leiden def  . Skin cancer    multiple chest and back   . Subarachnoid hemorrhage [facility])    Required surgery for clipping   . Suicide attempt [facility])    Review of Systems:  ***  Physical Exam:  There were no vitals filed for this visit. ***  Assessment & Plan:   See Encounters Tab for problem based charting.  Patient {GC/GE:3044014::"discussed with","seen with"} Dr. {NAMES:[id number]::"Butcher","Guilloud","[name]","[name]","[name]","[name]","[name]"}

## 2020-05-24 ENCOUNTER — Telehealth: Payer: Self-pay

## 2020-05-24 NOTE — Patient Instructions (Signed)
Visit Information  Ms. Sarah Simon  - as a part of your Medicaid benefit, you are eligible for care management and care coordination services at no cost or copay. I was unable to reach you by phone today but would be happy to help you with your health related needs. Please feel free to call me @ [phone].   Marland Kitchen  Mickel Fuchs, BSW, [place] Managed Medicaid Team  [phone]

## 2020-05-24 NOTE — Patient Outreach (Signed)
Care Coordination  05/24/2020  Sarah Simon [birth year] [id number]   Medicaid Managed Care   Unsuccessful Outreach Note  05/24/2020 Name: Sarah Simon MRN: [id number] DOB: June 15, 1960  Referred by: Asencion Noble, MD Reason for referral : High Risk Managed Medicaid (MM [facility] Unsuccessful Telephone Outreach)   Third unsuccessful telephone outreach was attempted today. The patient was referred to the case management team for assistance with care management and care coordination. The patient's primary care provider has been notified of our unsuccessful attempts to make or maintain contact with the patient. The care management team is pleased to engage with this patient at any time in the future should he/she be interested in assistance from the care management team.   Follow Up Plan: The patient has been provided with contact information for the care management team and has been advised to call with any health related questions or concerns.   Mickel Fuchs, BSW, [place] Managed Medicaid Team  [phone]

## 2020-06-14 DIAGNOSIS — Z419 Encounter for procedure for purposes other than remedying health state, unspecified: Secondary | ICD-10-CM | POA: Diagnosis not present

## 2020-07-14 DIAGNOSIS — Z419 Encounter for procedure for purposes other than remedying health state, unspecified: Secondary | ICD-10-CM | POA: Diagnosis not present

## 2021-04-04 ENCOUNTER — Ambulatory Visit: Payer: Medicaid Other | Admitting: Internal Medicine

## 2021-04-16 ENCOUNTER — Ambulatory Visit: Payer: Medicaid Other | Admitting: Internal Medicine

## 2021-04-16 ENCOUNTER — Telehealth: Payer: Self-pay | Admitting: *Deleted

## 2021-04-16 NOTE — Telephone Encounter (Signed)
Called patient left voice message for patient to return call if she is wanting to reschedule her missed appointment. Contact our office at [phone]. ?

## 2021-10-18 IMAGING — CT CT MAXILLOFACIAL W/O CM
3 series · 15 of 47 positions shown, 18 images · non-contrast
Comparison: Head CT report dated 11/22/1999. The images are not
available for direct comparison.

CLINICAL DATA: 58-year-old female with head trauma.

EXAM:
CT HEAD WITHOUT CONTRAST
CT MAXILLOFACIAL WITHOUT CONTRAST
CT CERVICAL SPINE WITHOUT CONTRAST
TECHNIQUE: Multidetector CT imaging of the head, cervical spine, and
maxillofacial structures were performed using the standard protocol
without intravenous contrast. Multiplanar CT image reconstructions
of the cervical spine and maxillofacial structures were also
generated.

[Series 3: facialbone 2.0 st · axial · 0.33mm/px · z∈[-214,-74]mm · 9 of 82 slices shown, 12 images]
[im 6/82  brain]
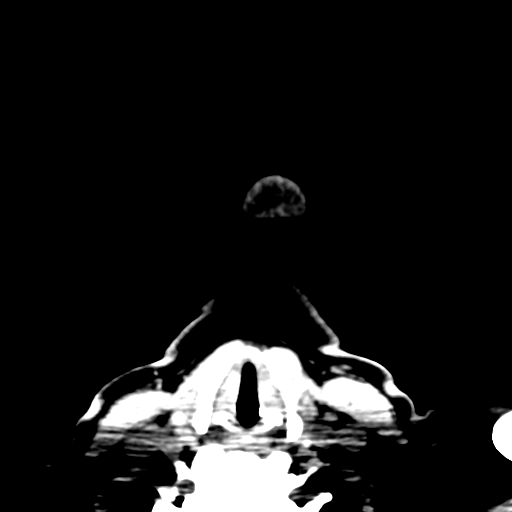
[im 6/82  bone]
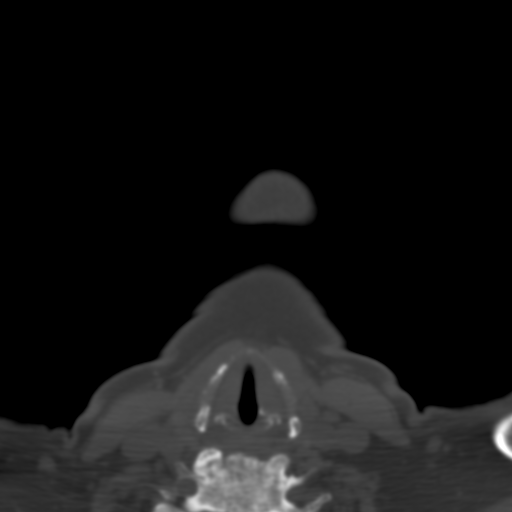
[im 14/82  bone]
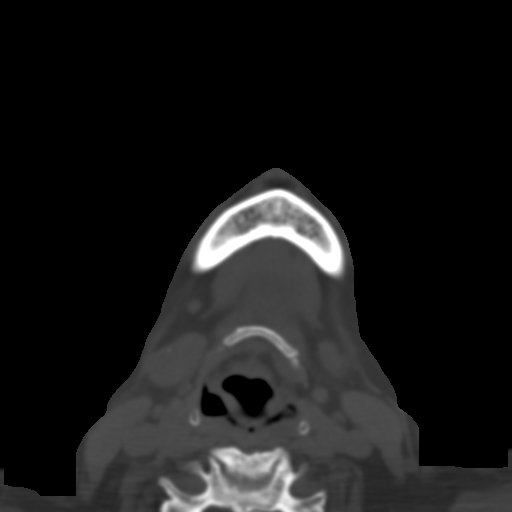
[im 23/82  bone]
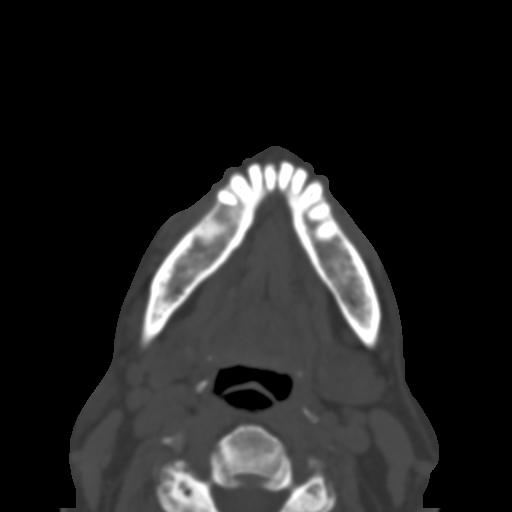
[im 31/82  bone]
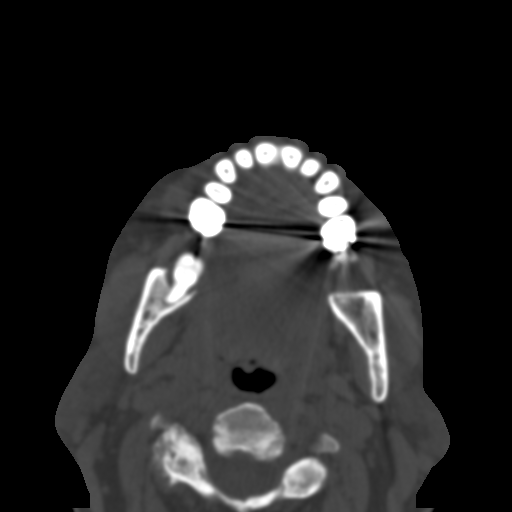
[im 42/82  brain]
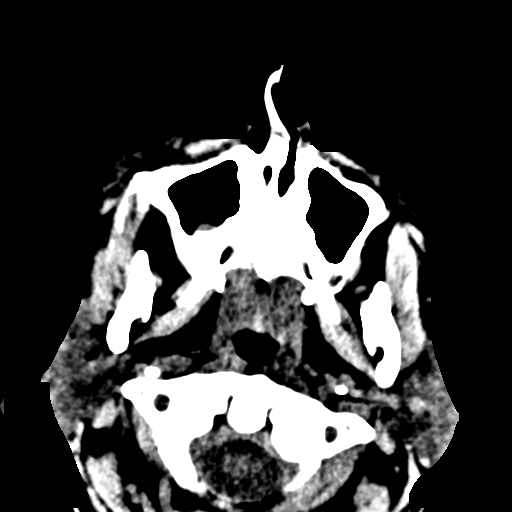
[im 42/82  bone]
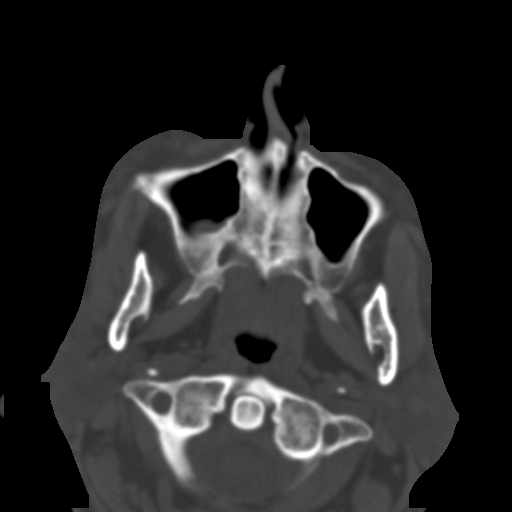
[im 51/82  bone]
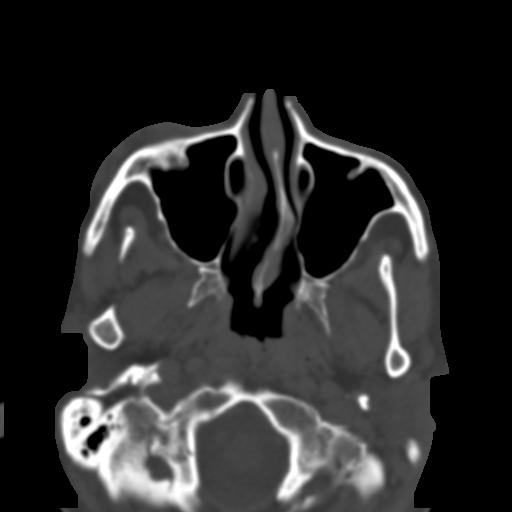
[im 59/82  bone]
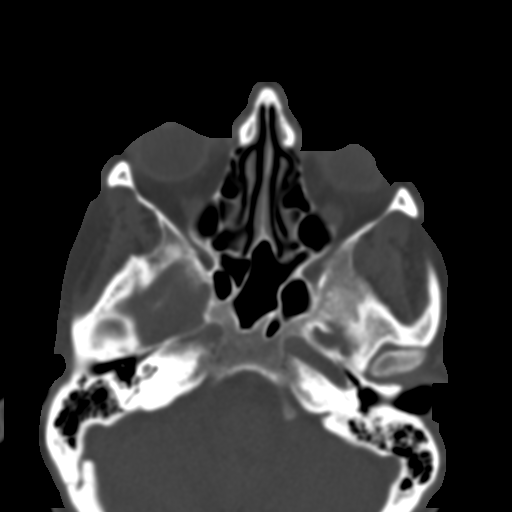
[im 68/82  bone]
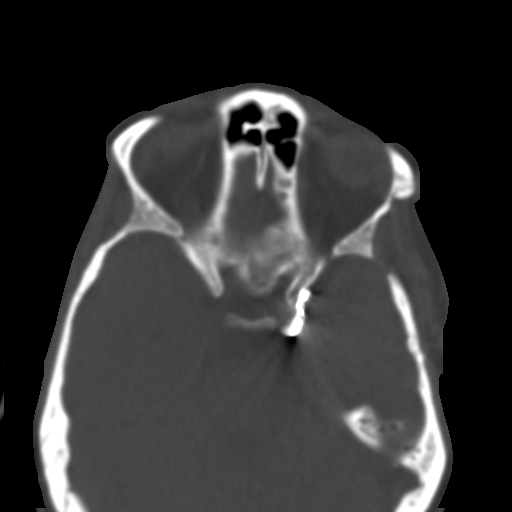
[im 76/82  brain]
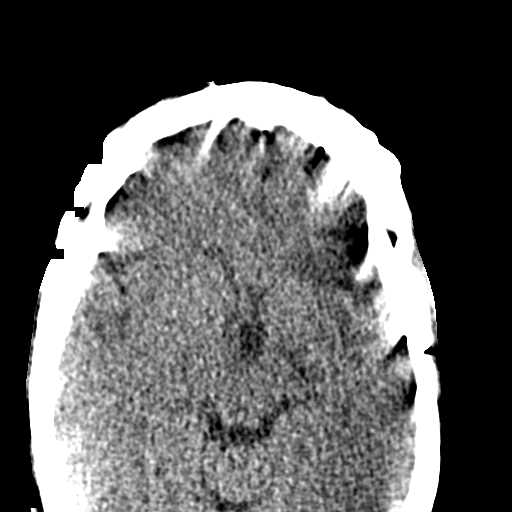
[im 76/82  bone]
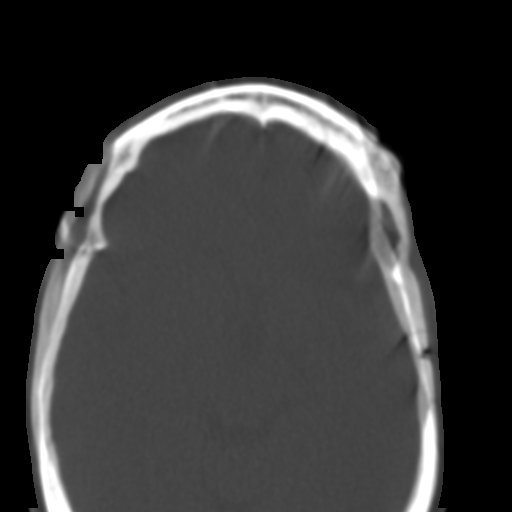

[Series 9: facialbone 2.0 cor st · coronal · 0.34mm/px · 3 of 76 slices shown]
[im 26/76  bone]
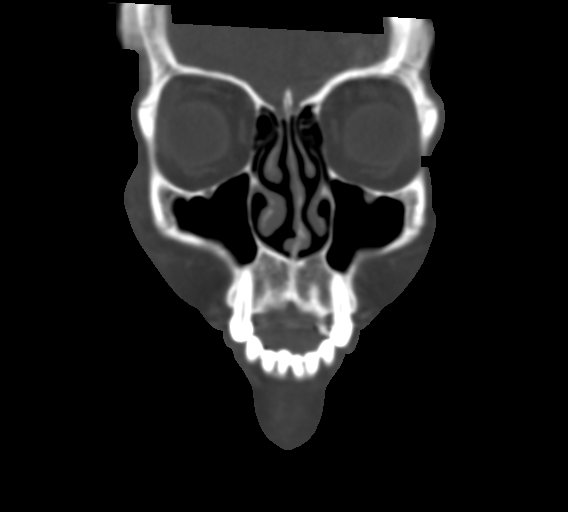
[im 34/76  bone]
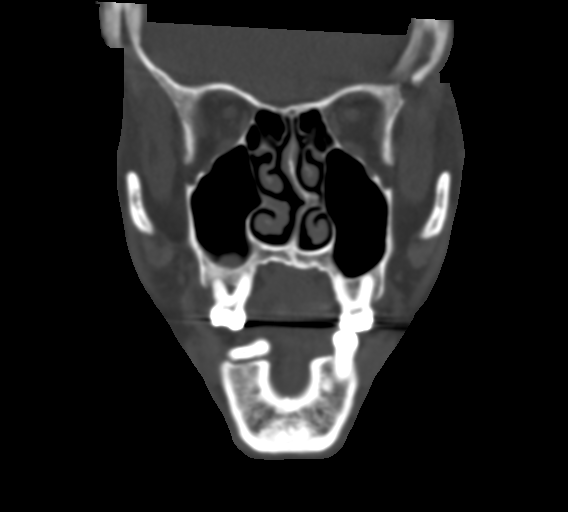
[im 42/76  bone]
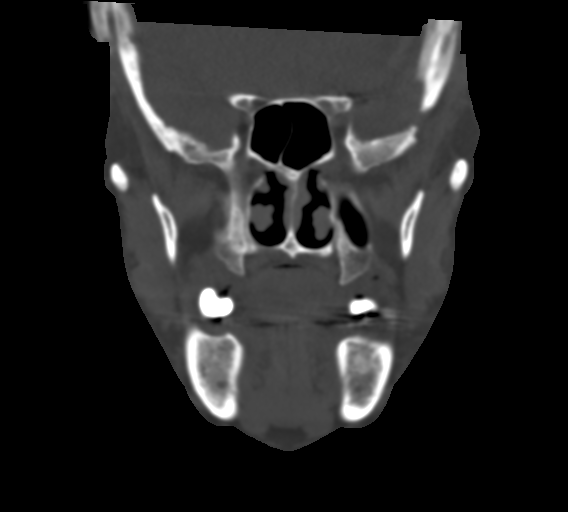

[Series 10: facialbone 2.0 sag st · sagittal · 0.30mm/px · 3 of 80 slices shown]
[im 27/80  bone]
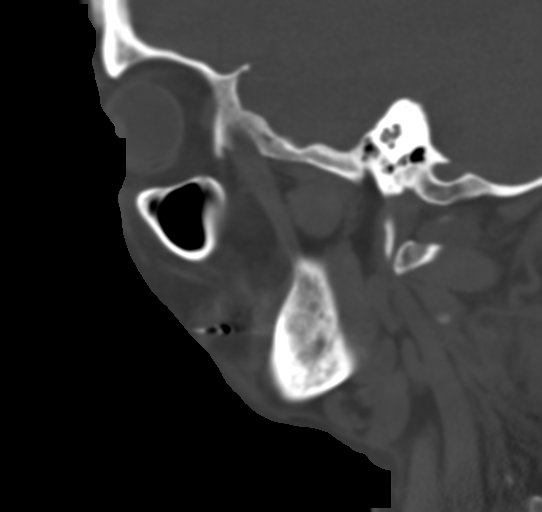
[im 40/80  bone]
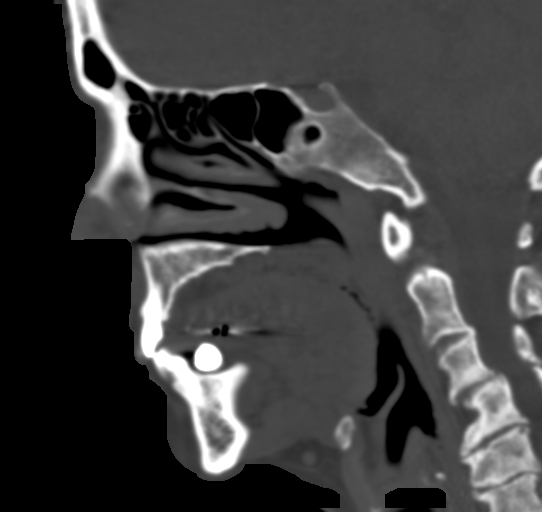
[im 53/80  bone]
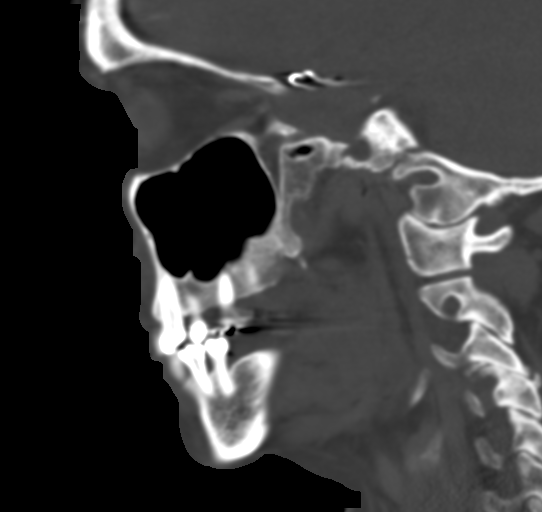

[15 of 47 positions shown; findings below may reference images not displayed]

FINDINGS: CT HEAD FINDINGS

Brain: There is mild age-related atrophy and chronic microvascular
ischemic changes. Postsurgical changes of the left frontal lobe with
an area of encephalomalacia along the inferior left frontal lobe and
anterior left temporal lobe. There is associated mild ex vacuo
dilatation of the frontal horn of the left lateral ventricle. There
is no acute intracranial hemorrhage. No mass effect or midline
shift. No extra-axial fluid collection.

Vascular: Surgical clips in the region of the supraclinoid ICA on
the left likely related to prior aneurysmal clip.

Skull: Left frontotemporal craniotomy. No acute calvarial pathology.

Other: Small right periorbital contusion.

CT MAXILLOFACIAL FINDINGS

Osseous: No acute fracture. No mandibular subluxation.

Orbits: The globes and retro-orbital fat are preserved.

Sinuses: Clear.

Soft tissues: Right periorbital contusion.

CT CERVICAL SPINE FINDINGS

Alignment: No acute subluxation. There is grade 1 C3-C4
anterolisthesis. Mild reversal of normal cervical lordosis at C4-C6,
likely related to degenerative changes.

Skull base and vertebrae: No acute fracture.

Soft tissues and spinal canal: No prevertebral fluid or swelling. No
visible canal hematoma.

Disc levels: Multilevel degenerative changes most prominent at
C4-C7. Multilevel facet arthropathy.

Upper chest: Negative.

Other: None
IMPRESSION: 1. No acute intracranial hemorrhage.
2. No acute/traumatic cervical spine pathology.
3. No acute facial bone fractures.

## 2021-11-12 ENCOUNTER — Ambulatory Visit (INDEPENDENT_AMBULATORY_CARE_PROVIDER_SITE_OTHER): Payer: Medicaid Other | Admitting: Student

## 2021-11-12 ENCOUNTER — Other Ambulatory Visit: Payer: Self-pay | Admitting: Student

## 2021-11-12 ENCOUNTER — Encounter: Payer: Self-pay | Admitting: Student

## 2021-11-12 VITALS — BP 137/78 | HR 74 | Temp 98.6°F | Ht 67.0 in | Wt 124.6 lb

## 2021-11-12 DIAGNOSIS — Z7901 Long term (current) use of anticoagulants: Secondary | ICD-10-CM

## 2021-11-12 DIAGNOSIS — Z1231 Encounter for screening mammogram for malignant neoplasm of breast: Secondary | ICD-10-CM

## 2021-11-12 DIAGNOSIS — E782 Mixed hyperlipidemia: Secondary | ICD-10-CM

## 2021-11-12 DIAGNOSIS — E119 Type 2 diabetes mellitus without complications: Secondary | ICD-10-CM | POA: Diagnosis not present

## 2021-11-12 DIAGNOSIS — M19021 Primary osteoarthritis, right elbow: Secondary | ICD-10-CM | POA: Diagnosis not present

## 2021-11-12 DIAGNOSIS — Z7984 Long term (current) use of oral hypoglycemic drugs: Secondary | ICD-10-CM | POA: Diagnosis not present

## 2021-11-12 DIAGNOSIS — Z131 Encounter for screening for diabetes mellitus: Secondary | ICD-10-CM | POA: Diagnosis not present

## 2021-11-12 DIAGNOSIS — Z0000 Encounter for general adult medical examination without abnormal findings: Secondary | ICD-10-CM

## 2021-11-12 DIAGNOSIS — L989 Disorder of the skin and subcutaneous tissue, unspecified: Secondary | ICD-10-CM

## 2021-11-12 DIAGNOSIS — Z87891 Personal history of nicotine dependence: Secondary | ICD-10-CM

## 2021-11-12 DIAGNOSIS — B182 Chronic viral hepatitis C: Secondary | ICD-10-CM | POA: Diagnosis not present

## 2021-11-12 DIAGNOSIS — Z1211 Encounter for screening for malignant neoplasm of colon: Secondary | ICD-10-CM

## 2021-11-12 DIAGNOSIS — I10 Essential (primary) hypertension: Secondary | ICD-10-CM | POA: Diagnosis not present

## 2021-11-12 MED ORDER — DICLOFENAC SODIUM 1 % EX GEL: 2.0000 g | Freq: Four times a day (QID) | CUTANEOUS | 0 refills | Status: DC

## 2021-11-12 NOTE — Assessment & Plan Note (Addendum)
Blood pressure is elevated on initial (143/79) and re-check (154/82), BP currently uncontrolled with goal of <130/80 . She reports she has not been taking any BP medications for 4 years. She was previously prescribed amlodipine and lisinopril-HCTZ when she was last seen in this clinic in 05/2017. She reports not tolerating BP medications well in the past, so she would like to avoid taking them if possible.  She also reports intentional 30 pound weight loss in the past 4 years through lifestyle and diet modifications. Suspect that NSAID use for her osteoarthritis could be contributing to elevated BP.  Plan -Patient given blood pressure log -Follow-up in 1 month to go over log and consider restarting BP medication depending on results -Counseled patient on decreasing NSAID use and importance of keeping BP within goal range

## 2021-11-12 NOTE — Assessment & Plan Note (Signed)
-  Interested in influenza and Shingrix vaccine at 1 month follow-up -Agreeable to cervical screening at 1 month follow-up -Patient reports occasionally experiencing heart palpitations with increased activity. Can follow-up on this at 1 month follow-up and consider EKG -Referral for colonoscopy and mammogram -A1C, lipid panel, CMP

## 2021-11-12 NOTE — Assessment & Plan Note (Addendum)
Patient reports she has no longer been on Coumadin therapy for a few years with no issues. She had previously been taking lifelong Coumadin for PE and Factor 5 Leiden deficiency.

## 2021-11-12 NOTE — Patient Instructions (Addendum)
Sarah Simon,  It was great seeing you in clinic today, and we are happy you are doing well overall.   For your osteoarthritis:  - Recommend using Advil sparingly (once at night on days with pain that is unbearable) - Add Voltaren gel that you can apply in areas you are hurting - Continue with your pain medication as instructed from your pain specialist  For your hypertension: - We would like you to log your blood pressure and re-check in a month - Decreasing Advil use can help lower blood pressure  Preventative Health maintenance - A1C, lipid panel, CMP - Flu vaccine, Shingrix vaccine - Mammogram and colonoscopy referral - Pap smear at next clinic appointment in 1 month  Please note the following changes to your medication: Start Voltaren gel to apply joint areas that are hurting  It is a pleasure being a part of your team, thank you for allowing [place] to be a part of your care,  Sarah Simon and Dr. Collene Gobble  Please call our clinic if you have any questions or concerns, we may be able to help and keep you from a long and expensive emergency room wait. Our clinic and after hours phone number is [phone], the best time to call is Monday through Friday 9 am to 4 pm but there is always someone available 24/7 if you have an emergency. If you need medication refills please notify your pharmacy one week in advance and they will send [place] a request.

## 2021-11-12 NOTE — Assessment & Plan Note (Addendum)
Skin lesion on left shin with erythema and irregular borders. Patient reports following with dermatology at [facility] and has an appointment coming up.

## 2021-11-12 NOTE — Progress Notes (Signed)
Subjective:   Patient ID: Sarah Simon female   DOB: 29-Sep-1960 61 y.o.   MRN: [id number]  HPI: Sarah Simon is a 61 y.o. woman with past medical history of hypertension and osteoarthritis who presents for preventative health care and re-establishing care with the clinic.   Osteoarthritis - Patient is being seen at [facility] for pain management. Her pain is primarily in her bilateral knee joints and lower back. This morning she reports that her pain is controlled. She reports constant throbbing pain. She is taking Percocet 3 times a day for the pain. She reports she occasionally takes Advil twice in the morning and twice in the evening for the pain as well. She reports that exercise on cycling machine appears to help with the osteoarthritis.   Hypertension - She reports that she has not taken any blood pressure medications for 4-5 years. She also reports a 30 pound weight loss over the past 4 years that she states as intentional with diet and lifestyle changes. She reports exercising routinely. She reports not tolerating BP medications well in the past, so she would like to avoid taking them if possible.   Preventative health care - Patient is interested in colonoscopy, mammography, and cervical screenings. She is also interested in receiving influenza and Shingrix vaccinations.  Past Medical History:  Diagnosis Date   Factor V Leiden mutation ([place])    Hepatitis C carrier ([place])    Hypertension 08/04/2006   On and off medications  Has cost problems for her medications  HTN without complications      Melanoma ([place])    unclear diagnosis, this is per pt report, no treatment or follow up known. per pt on left leg   Pulmonary embolism [facility]) May 2003    On Lifelong coumidin Factor V leiden def   Skin cancer    multiple chest and back    Subarachnoid hemorrhage [facility])    Required surgery for clipping    Suicide attempt [facility])     Patient Active Problem List    Diagnosis Date Noted   'light-for-dates' infant with signs of fetal malnutrition 02/15/2020   Leg skin lesion, left 12/11/2015   Rash and nonspecific skin eruption 03/16/2015   Osteoarthritis 04/27/2014   Liver cirrhosis with liver nodules 12/01/2013   History of melanoma 06/06/2011   Alcohol abuse 02/07/2011   Lifelong Coumadin therapy 02/03/2010   Generalized anxiety disorder 09/13/2009   Depression 06/07/2009   Chronic hepatitis C without hepatic coma ([place]) 08/04/2008   Hyperlipidemia 07/29/2008   TRANSAMINASES, SERUM, ELEVATED 05/25/2008   Hypertension 08/04/2006     No current outpatient medications on file.   No current facility-administered medications for this visit.   Review of Systems: Constitutional: No headaches, no dizziness, no vision changes. She has had weight loss (reports 30 pounds in past 4 years) Respiratory: No shortness of breath, no difficulty breathing Cardiovascular: No chest pain, reports occasional palpitations with increased activity  Gastrointestinal: No abdominal pain, no nausea, no vomiting. No diarrhea or constipation Genitourinary: No issues with urination  Social Hx: Tobacco use: None Alcohol use: Occasional beer Drug use: Occasionally smokes marijuana  Objective:   Physical Exam: Vitals:   11/12/21 1056 11/12/21 1133 11/12/21 1145  BP: (!) 143/79 (!) 154/82 137/78  Pulse: 75 75 74  Temp: 98.6 F (37 C)    TempSrc: Oral    SpO2: 99%    Weight: 124 lb 9.6 oz (56.5 kg)    Height: 5'  7" (1.702 m)      Constitutional: Pleasant, well appearing in no acute distress HENT: mucous membranes moist Cardiovascular: regular rate with normal rhythm, no murmurs Pulmonary/Chest: normal work of breathing on room air, lungs clear to auscultation bilaterally Abdominal: soft, non-tender, non-distended, bowel sounds present MSK: No lower extremity edema Skin: warm and dry. Skin lesion on left shin with erythema and irregular borders. Neurological:  alert and answering questions appropriately. Psych: appropriate mood and affect   Assessment & Plan:   Hypertension Blood pressure is elevated on initial (143/79) and re-check (154/82), BP currently uncontrolled with goal of <130/80 . She reports she has not been taking any BP medications for 4 years. She was previously prescribed amlodipine and lisinopril-HCTZ when she was last seen in this clinic in 05/2017. She reports not tolerating BP medications well in the past, so she would like to avoid taking them if possible.  She also reports intentional 30 pound weight loss in the past 4 years through lifestyle and diet modifications. Suspect that NSAID use for her osteoarthritis could be contributing to elevated BP.  Plan -Patient given blood pressure log -Follow-up in 1 month to go over log and consider restarting BP medication depending on results -Counseled patient on decreasing NSAID use and importance of keeping BP within goal range  Osteoarthritis Patient reports experiencing pain primarily in her bilateral knees and lower back. She reports that her pain is controlled this morning. She has been seeing a pain specialist at [facility] with current regimen of Percocet TID. She also reports occassionally taking Advil twice in the morning and twice in the evening for her pain.  Plan -Continue medication regimen as recommended from pain specialist (Percocet TID) -Advised patient to use Advil once in the evening on days that the pain is unbearable. Counseled her on the effects of NSAIDS on kidneys, GI system, and blood pressure. -Start Voltaren gel to apply to her joints experiencing pain -Encouraged patient to continue with her exercise such as cycling machine  Leg skin lesion, left Skin lesion on left shin with erythema and irregular borders. Patient reports following with dermatology at [facility] and has an appointment coming up.  Lifelong Coumadin therapy Patient reports she has  no longer been on Coumadin therapy for a few years with no issues. She had previously been taking lifelong Coumadin for PE and Factor 5 Leiden deficiency.  Preventative health care -Interested in influenza and Shingrix vaccine at 1 month follow-up -Agreeable to cervical screening at 1 month follow-up -Patient reports occasionally experiencing heart palpitations with increased activity. Can follow-up on this at 1 month follow-up and consider EKG -Referral for colonoscopy and mammogram -A1C, lipid panel, CMP   Sarah Simon (Max) Hornell Student, MS3 11/12/2021, 3:28 PM

## 2021-11-12 NOTE — Assessment & Plan Note (Addendum)
Patient reports experiencing pain primarily in her bilateral knees and lower back. She reports that her pain is controlled this morning. She has been seeing a pain specialist at [facility] with current regimen of Percocet TID. She also reports occassionally taking Advil twice in the morning and twice in the evening for her pain.  Plan -Continue medication regimen as recommended from pain specialist (Percocet TID) -Advised patient to use Advil once in the evening on days that the pain is unbearable. Counseled her on the effects of NSAIDS on kidneys, GI system, and blood pressure. -Start Voltaren gel to apply to her joints experiencing pain -Encouraged patient to continue with her exercise such as cycling machine

## 2021-11-12 NOTE — Assessment & Plan Note (Deleted)
-  Interested in influenza and Shingrix vaccine at 1 month follow-up -Agreeable to cervical screening at 1 month follow-up -Patient reports occasionally experiencing heart palpitations with increased activity. Can follow-up on this at 1 month follow-up and consider EKG -Referral for colonoscopy and mammogram -A1C, lipid panel, CMP

## 2021-11-13 ENCOUNTER — Encounter: Payer: Self-pay | Admitting: Internal Medicine

## 2021-11-13 DIAGNOSIS — E119 Type 2 diabetes mellitus without complications: Secondary | ICD-10-CM | POA: Insufficient documentation

## 2021-11-13 LAB — CMP14 + ANION GAP
ALT: 34 IU/L — ABNORMAL HIGH (ref 0–32)
AST: 32 IU/L (ref 0–40)
Albumin/Globulin Ratio: 1.5 (ref 1.2–2.2)
Albumin: 4.3 g/dL (ref 3.9–4.9)
Alkaline Phosphatase: 156 IU/L — ABNORMAL HIGH (ref 44–121)
Anion Gap: 15 mmol/L (ref 10.0–18.0)
BUN/Creatinine Ratio: 24 (ref 12–28)
BUN: 16 mg/dL (ref 8–27)
Bilirubin Total: 0.5 mg/dL (ref 0.0–1.2)
CO2: 22 mmol/L (ref 20–29)
Calcium: 9.7 mg/dL (ref 8.7–10.3)
Chloride: 100 mmol/L (ref 96–106)
Creatinine, Ser: 0.66 mg/dL (ref 0.57–1.00)
Globulin, Total: 2.8 g/dL (ref 1.5–4.5)
Glucose: 301 mg/dL — ABNORMAL HIGH (ref 70–99)
Potassium: 3.6 mmol/L (ref 3.5–5.2)
Sodium: 137 mmol/L (ref 134–144)
Total Protein: 7.1 g/dL (ref 6.0–8.5)
eGFR: 100 mL/min/{1.73_m2} (ref >59)

## 2021-11-13 LAB — LIPID PANEL
Chol/HDL Ratio: 4.9 ratio — ABNORMAL HIGH (ref 0.0–4.4)
Cholesterol, Total: 186 mg/dL (ref 100–199)
HDL: 38 mg/dL — ABNORMAL LOW (ref >39)
LDL Chol Calc (NIH): 88 mg/dL (ref 0–99)
Triglycerides: 363 mg/dL — ABNORMAL HIGH (ref 0–149)
VLDL Cholesterol Cal: 60 mg/dL — ABNORMAL HIGH (ref 5–40)

## 2021-11-13 LAB — HEMOGLOBIN A1C
Est. average glucose Bld gHb Est-mCnc: 183 mg/dL
Hgb A1c MFr Bld: 8 % — ABNORMAL HIGH (ref 4.8–5.6)

## 2021-11-13 MED ORDER — METFORMIN HCL ER 500 MG PO TB24: 500.0000 mg | ORAL_TABLET | Freq: Every day | ORAL | 0 refills | Status: DC

## 2021-11-13 MED ORDER — PRAVASTATIN SODIUM 40 MG PO TABS: 40.0000 mg | ORAL_TABLET | Freq: Every evening | ORAL | 0 refills | Status: DC

## 2021-11-13 NOTE — Addendum Note (Signed)
Addended bySanjuan Dame on: 11/13/2021 04:07 PM   Modules accepted: Orders

## 2021-11-13 NOTE — Assessment & Plan Note (Signed)
LDL 88, current ASCVD 10-year risk 9.4%. With diagnosis of diabetes, we will initiate moderate-intensity statin.  - Start pravastatin '40mg'$  daily

## 2021-11-13 NOTE — Assessment & Plan Note (Signed)
A1c 8.0% found today on routine lab work. We will plan to initiate metformin '500mg'$  once daily as well as statin. She is to return to clinic in three months for A1c follow-up.  - Start metformin '500mg'$  in the morning - Lifestyle modifications - Repeat A1c in three months

## 2021-11-14 NOTE — Progress Notes (Signed)
Internal Medicine Clinic Attending ? ?Case discussed with Dr. Braswell  At the time of the visit.  We reviewed the resident?s history and exam and pertinent patient test results.  I agree with the assessment, diagnosis, and plan of care documented in the resident?s note.  ?

## 2021-11-30 ENCOUNTER — Ambulatory Visit (AMBULATORY_SURGERY_CENTER): Payer: Self-pay

## 2021-11-30 VITALS — Ht 67.0 in | Wt 137.0 lb

## 2021-11-30 DIAGNOSIS — Z1211 Encounter for screening for malignant neoplasm of colon: Secondary | ICD-10-CM

## 2021-11-30 MED ORDER — PLENVU 140 G PO SOLR: 1.0000 | ORAL | 0 refills | Status: DC

## 2021-11-30 NOTE — Progress Notes (Signed)
No egg or soy allergy known to patient;  No issues known to pt with past sedation with any surgeries or procedures; Patient denies ever being told they had issues or difficulty with intubation;  No FH of Malignant Hyperthermia; Pt is not on diet pills; Pt is not on home 02;  Pt is not on blood thinners;  Pt denies issues with constipation;  No A fib or A flutter; Have any cardiac testing pending--NO Pt instructed to use Singlecare.com or GoodRx for a price reduction on prep   Insurance verified during [place] appt=[place] Medicaid/[employer]  Patient's chart reviewed by Osvaldo Angst CNRA prior to previsit and patient appropriate for the [place].  Previsit completed and red dot placed by patient's name on their procedure day (on provider's schedule).    Extra time given to patient for questions during PV appt;

## 2021-12-17 ENCOUNTER — Ambulatory Visit: Payer: Medicaid Other | Admitting: Internal Medicine

## 2021-12-27 ENCOUNTER — Telehealth: Payer: Self-pay | Admitting: Internal Medicine

## 2021-12-27 NOTE — Telephone Encounter (Signed)
Good Afternoon Dr. Lorenso Courier,   Patient called stating that she needed to reschedule her colonoscopy with you tomorrow at 11:00 due to being sick.   Patient was rescheduled for 2/12 at 11:00

## 2021-12-28 ENCOUNTER — Encounter: Payer: Medicaid Other | Admitting: Internal Medicine

## 2022-01-09 ENCOUNTER — Encounter: Payer: Medicaid Other | Admitting: Student

## 2022-01-10 ENCOUNTER — Ambulatory Visit: Payer: Medicaid Other

## 2022-01-29 ENCOUNTER — Encounter: Payer: Medicaid Other | Admitting: Student

## 2022-02-25 ENCOUNTER — Encounter: Payer: Medicaid Other | Admitting: Internal Medicine

## 2022-02-25 ENCOUNTER — Telehealth: Payer: Self-pay | Admitting: Internal Medicine

## 2022-02-25 NOTE — Telephone Encounter (Signed)
Patient is calling to cancel her procedure today at 11:00 due to her being sick. Will call back to reschedule

## 2022-03-05 ENCOUNTER — Ambulatory Visit: Payer: Medicaid Other

## 2022-04-17 ENCOUNTER — Ambulatory Visit: Payer: Medicaid Other | Admitting: Student

## 2022-04-17 ENCOUNTER — Encounter: Payer: Medicaid Other | Admitting: Student

## 2022-04-17 VITALS — BP 158/110 | HR 77 | Temp 98.2°F | Wt 120.3 lb

## 2022-04-17 DIAGNOSIS — D099 Carcinoma in situ, unspecified: Secondary | ICD-10-CM | POA: Diagnosis not present

## 2022-04-17 DIAGNOSIS — Z7901 Long term (current) use of anticoagulants: Secondary | ICD-10-CM

## 2022-04-17 DIAGNOSIS — Z85820 Personal history of malignant melanoma of skin: Secondary | ICD-10-CM | POA: Diagnosis not present

## 2022-04-17 DIAGNOSIS — K7460 Unspecified cirrhosis of liver: Secondary | ICD-10-CM | POA: Diagnosis not present

## 2022-04-17 DIAGNOSIS — L989 Disorder of the skin and subcutaneous tissue, unspecified: Secondary | ICD-10-CM

## 2022-04-17 DIAGNOSIS — R634 Abnormal weight loss: Secondary | ICD-10-CM | POA: Diagnosis not present

## 2022-04-17 DIAGNOSIS — B182 Chronic viral hepatitis C: Secondary | ICD-10-CM

## 2022-04-17 DIAGNOSIS — Z85828 Personal history of other malignant neoplasm of skin: Secondary | ICD-10-CM | POA: Diagnosis not present

## 2022-04-17 DIAGNOSIS — I10 Essential (primary) hypertension: Secondary | ICD-10-CM | POA: Diagnosis not present

## 2022-04-17 DIAGNOSIS — E782 Mixed hyperlipidemia: Secondary | ICD-10-CM

## 2022-04-17 DIAGNOSIS — L570 Actinic keratosis: Secondary | ICD-10-CM

## 2022-04-17 DIAGNOSIS — E119 Type 2 diabetes mellitus without complications: Secondary | ICD-10-CM | POA: Diagnosis present

## 2022-04-17 DIAGNOSIS — Z1211 Encounter for screening for malignant neoplasm of colon: Secondary | ICD-10-CM | POA: Insufficient documentation

## 2022-04-17 LAB — POCT GLYCOSYLATED HEMOGLOBIN (HGB A1C): Hemoglobin A1C: 7.7 % — AB (ref 4.0–5.6)

## 2022-04-17 LAB — GLUCOSE, CAPILLARY: Glucose-Capillary: 161 mg/dL — ABNORMAL HIGH (ref 70–99)

## 2022-04-17 MED ORDER — METFORMIN HCL ER 500 MG PO TB24: 500.0000 mg | ORAL_TABLET | Freq: Every day | ORAL | 0 refills | Status: DC

## 2022-04-17 NOTE — Patient Instructions (Signed)
Thank you, Ms.[name] for allowing [place] to provide your care today. Today we discussed .    Skin Lesion left leg I would like you to see the dermatologist ASAP for this.   Nausea/vomiting/diarrhea I would like you to follow up with the GI doctor to get your colonoscopy  Diabetes Please take your metformin daily  High cholesterol Please take your pravastatin daily  We will check blood work today and I will call you with the results. Please return to the clinic in 1-2 months   I have ordered the following labs for you:   Lab Orders         Glucose, capillary         CBC with Diff         CMP14 + Anion Gap         TSH         Microalbumin / Creatinine Urine Ratio         POC Hbg A1C      Referrals ordered today:    Referral Orders         Ambulatory referral to Dermatology      I have ordered the following medication/changed the following medications:   Stop the following medications: There are no discontinued medications.   Start the following medications: No orders of the defined types were placed in this encounter.    Follow up:  1 month     Should you have any questions or concerns please call the internal medicine clinic at [phone].    Sanjuana Letters, D.O. [place]

## 2022-04-17 NOTE — Assessment & Plan Note (Addendum)
Presents with lesion on left anterior shin for the past 7-8 years. It started off a small red circular are that has slowly grown. She notes that the area will scab over and then the scab will fall off revealing an erythematous lesion underneath. She was evaluated by a dermatologist at [place] center few months ago who wanted the patient to return for a biopsy but she never followed up. She has noticed perioid clear drainage and now has noticed erythema moving downard  On exam she has a 1.5 x 2.5 cm lesion to the anterior shin covered with a black scab. Appearance and description of wound concerning for squamous cell carcinoma. 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   With her weight loss and diffuse itching will need to make sure no metastatic disease though has been present for some time. Urgent referral to dermatology, will work with our referral department. CBC,CMP, TSH today. Other differentials would be melanoma but again been present for some time. She did have prior melanoma of the lateral aspect of her right thigh that she says was "cut out." Cutaneous manifestation of hepatitis C but that has been treated in the past.

## 2022-04-17 NOTE — Assessment & Plan Note (Signed)
Hx of extensive alcohol use and hepatitis C. Has not had RUQ [place] in some time. CMP pending and will order RUQ [place].

## 2022-04-17 NOTE — Assessment & Plan Note (Signed)
Will need to follow up at next visit to discuss restarting blood pressure medications.   Addendum: With albuminuria would consider arb therapy

## 2022-04-17 NOTE — Assessment & Plan Note (Addendum)
BCC of left mid back found in 2011 s/p excision with clean margins in 2011 by [facility] dermatology.

## 2022-04-17 NOTE — Assessment & Plan Note (Signed)
Lesions on right side of forehead and right shoulder which appear consistent with actinic keratosis (see media tab). Will follow up with dermatology.

## 2022-04-17 NOTE — Assessment & Plan Note (Signed)
Do not believe she has had prior colonoscopy. Was scheduled recently but canceled due to not feeling well. Discussed importance of continued cancer screening, especially in light of her hx of dermatologic malignancies and periodic episodes of nausea/vomiting/diarrhea. She will call GI office to reschedule colonoscopy. Also needs variceal surveillance with her cirrhosis, do not see any notes about prior endoscopy.

## 2022-04-17 NOTE — Progress Notes (Addendum)
CC: left anterior shin lesion  HPI:  [name] is a 62 y.o. female living with a history stated below and presents today for left anterior lower extremity lesion. Please see problem based assessment and plan for additional details.  Past Medical History:  Diagnosis Date   Arthritis    back and bilateral knees   Factor V Leiden mutation ([place])    Hepatitis C carrier ([place])    Hyperlipidemia    on meds   Hypertension 08/04/2006   On and off medications-Has cost problems for her medications=HTN without complications   Melanoma ([place])    unclear diagnosis, this is per pt report, no treatment or follow up known. per pt on left leg   Pulmonary embolism ([place]) 05/2001    On Lifelong coumidin Factor V leiden def   Seasonal allergies    Skin cancer    multiple chest and back    Subarachnoid hemorrhage (HCC)    Required surgery for clipping    Suicide attempt [facility])     Current Outpatient Medications on File Prior to Visit  Medication Sig Dispense Refill   aspirin 325 MG tablet Take 325 mg by mouth every 6 (six) hours as needed for moderate pain or mild pain.     diclofenac Sodium (VOLTAREN) 1 % GEL Apply 2 g topically 4 (four) times daily. (Patient not taking: Reported on 11/30/2021) 50 g 0   naloxone (NARCAN) nasal spray 4 mg/0.1 mL Place 1 spray into the nose once.     oxyCODONE-acetaminophen (PERCOCET) 10-325 MG tablet Take 1 tablet by mouth every 6 (six) hours as needed for pain.     PEG-KCl-NaCl-NaSulf-Na Asc-C (PLENVU) 140 g SOLR Take 1 kit by mouth as directed. 1 each 0   pravastatin (PRAVACHOL) 40 MG tablet Take 1 tablet (40 mg total) by mouth every evening. 90 tablet 0   No current facility-administered medications on file prior to visit.    Review of Systems: ROS negative except for what is noted on the assessment and plan.  Vitals:   04/17/22 0916  BP: (!) 158/110  Pulse: 77  Temp: 98.2 F (36.8 C)  TempSrc: Oral  SpO2: 96%  Weight: 120 lb 4.8 oz (54.6  kg)    Physical Exam: Constitutional: well-appearing, in no acute distress HENT: normocephalic atraumatic, mucous membranes moist Eyes: conjunctiva non-erythematous Neck: supple Cardiovascular: regular rate and rhythm, no m/r/g Pulmonary/Chest: normal work of breathing on room air, lungs clear to auscultation bilaterally Abdominal: soft, non-tender, non-distended MSK: normal bulk and tone. Circumferential black lesion with surrounding erythema of the left anterior shin. Right shoulder with area of erythema. Right forehead with area of erythema.  Neurological: alert & oriented x 3 Skin: warm and dry Psych: normal mood  Assessment & Plan:   Hypertension Will need to follow up at next visit to discuss restarting blood pressure medications.   Chronic hepatitis C without hepatic coma ([place]) S/p treatment with Solvaldi and ribavirin in 11/2014. Has not followed up with ID since. Last hepatitis C RNA levels were undetectable 09/2014. Will need repeat Hep C RNA levels to determine if cured of disease.   Liver cirrhosis with liver nodules Hx of extensive alcohol use and hepatitis C. Has not had RUQ [place] in some time. CMP pending and will order RUQ [place].   Type II diabetes mellitus (HCC) A1c today of 7.7% from 8.0%. Has not been on metformin previously prescribed. Discussed restarting that at 500 mg daily and continue to work on a  healthy diet and lifestyle changes. Microalbumin/creatinine ratio pending. Repeat A1c in 3 months  Leg skin lesion, left Presents with lesion on left anterior shin for the past 7-8 years. It started off a small red circular are that has slowly grown. She notes that the area will scab over and then the scab will fall off revealing an erythematous lesion underneath. She was evaluated by a dermatologist at [place] center few months ago who wanted the patient to return for a biopsy but she never followed up. She has noticed perioid clear drainage and now has noticed  erythema moving downard  On exam she has a 1.5 x 2.5 cm lesion to the anterior shin covered with a black scab. Appearance and description of wound concerning for squamous cell carcinoma. 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   With her weight loss and diffuse itching will need to make sure no metastatic disease though has been present for some time. Urgent referral to dermatology, will work with our referral department. CBC,CMP, TSH today. Other differentials would be melanoma but again been present for some time. She did have prior melanoma of the lateral aspect of her right thigh that she says was "cut out." Cutaneous manifestation of hepatitis C but that has been treated in the past.   History of melanoma Hx of melanoma of the left thigh that she notes was surgically removed. Concerned with her chronic left anterior shin lesion.   Encounter for screening colonoscopy Do not believe she has had prior colonoscopy. Was scheduled recently but canceled due to not feeling well. Discussed importance of continued cancer screening, especially in light of her hx of dermatologic malignancies and periodic episodes of nausea/vomiting/diarrhea. She will call GI office to reschedule colonoscopy. Also needs variceal surveillance with her cirrhosis, do not see any notes about prior endoscopy.   History of basal cell carcinoma BCC of left mid back found in 2011 s/p excision with clean margins in 2011 by [facility] dermatology.  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 Will follow up with  dermatology.  Patient discussed with Dr. Saverio Danker. Overall clinical picture is concerning for underyling malignancy and will hopefully get her an appointment with dermatology soon for a biopsy and further management. She has many chronic medcial conditions and requires close follow up, will have her return to clinic in 2-4 weeks. She has had difficulty with follow up in the past as her partner is being treated for malignancy.   At follow up will need to address cirrhosis, repeat hep C RNA testing, and factor V Leiden deficiency tx  Sanjuana Letters, D.O. [facility], PGY-3 Phone: [phone] Date 04/17/2022 Time 8:04 PM

## 2022-04-17 NOTE — Assessment & Plan Note (Addendum)
Hx of melanoma of the left thigh that she notes was surgically removed. Concerned with her chronic left anterior shin lesion.

## 2022-04-17 NOTE — Assessment & Plan Note (Addendum)
A1c today of 7.7% from 8.0%. Has not been on metformin previously prescribed. Discussed restarting that at 500 mg daily and continue to work on a healthy diet and lifestyle changes. Microalbumin/creatinine ratio pending. Repeat A1c in 3 months

## 2022-04-17 NOTE — Assessment & Plan Note (Addendum)
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

## 2022-04-17 NOTE — Assessment & Plan Note (Addendum)
S/p treatment with Solvaldi and ribavirin in 11/2014. Has not followed up with ID since. Last hepatitis C RNA levels were undetectable 09/2014. Will need repeat Hep C RNA levels to determine if cured of disease.

## 2022-04-17 NOTE — Assessment & Plan Note (Signed)
Need to discuss at follow up with history of PE and F5L deficiency

## 2022-04-18 LAB — CMP14 + ANION GAP
ALT: 42 IU/L — ABNORMAL HIGH (ref 0–32)
AST: 66 IU/L — ABNORMAL HIGH (ref 0–40)
Albumin/Globulin Ratio: 1.3 (ref 1.2–2.2)
Albumin: 4.7 g/dL (ref 3.9–4.9)
Alkaline Phosphatase: 158 IU/L — ABNORMAL HIGH (ref 44–121)
Anion Gap: 17 mmol/L (ref 10.0–18.0)
BUN/Creatinine Ratio: 11 — ABNORMAL LOW (ref 12–28)
BUN: 8 mg/dL (ref 8–27)
Bilirubin Total: 0.5 mg/dL (ref 0.0–1.2)
CO2: 23 mmol/L (ref 20–29)
Calcium: 9.6 mg/dL (ref 8.7–10.3)
Chloride: 99 mmol/L (ref 96–106)
Creatinine, Ser: 0.72 mg/dL (ref 0.57–1.00)
Globulin, Total: 3.5 g/dL (ref 1.5–4.5)
Glucose: 149 mg/dL — ABNORMAL HIGH (ref 70–99)
Potassium: 4.2 mmol/L (ref 3.5–5.2)
Sodium: 139 mmol/L (ref 134–144)
Total Protein: 8.2 g/dL (ref 6.0–8.5)
eGFR: 95 mL/min/{1.73_m2} (ref >59)

## 2022-04-18 LAB — CBC WITH DIFFERENTIAL/PLATELET
Basophils Absolute: 0.1 10*3/uL (ref 0.0–0.2)
Basos: 2 %
EOS (ABSOLUTE): 0.1 10*3/uL (ref 0.0–0.4)
Eos: 1 %
Hematocrit: 40.4 % (ref 34.0–46.6)
Hemoglobin: 14.3 g/dL (ref 11.1–15.9)
Immature Grans (Abs): 0 10*3/uL (ref 0.0–0.1)
Immature Granulocytes: 0 %
Lymphocytes Absolute: 3.1 10*3/uL (ref 0.7–3.1)
Lymphs: 38 %
MCH: 34.3 pg — ABNORMAL HIGH (ref 26.6–33.0)
MCHC: 35.4 g/dL (ref 31.5–35.7)
MCV: 97 fL (ref 79–97)
Monocytes Absolute: 1.1 10*3/uL — ABNORMAL HIGH (ref 0.1–0.9)
Monocytes: 14 %
NRBC: 1 % — ABNORMAL HIGH (ref 0–0)
Neutrophils Absolute: 3.7 10*3/uL (ref 1.4–7.0)
Neutrophils: 45 %
Platelets: 239 10*3/uL (ref 150–450)
RBC: 4.17 x10E6/uL (ref 3.77–5.28)
RDW: 11.9 % (ref 11.7–15.4)
WBC: 8.1 10*3/uL (ref 3.4–10.8)

## 2022-04-18 LAB — MICROALBUMIN / CREATININE URINE RATIO
Creatinine, Urine: 251 mg/dL
Microalb/Creat Ratio: 45 mg/g creat — ABNORMAL HIGH (ref 0–29)
Microalbumin, Urine: 112.1 ug/mL

## 2022-04-18 LAB — TSH: TSH: 0.885 u[IU]/mL (ref 0.450–4.500)

## 2022-04-18 MED ORDER — PRAVASTATIN SODIUM 40 MG PO TABS: 40.0000 mg | ORAL_TABLET | Freq: Every evening | ORAL | 1 refills | Status: DC

## 2022-04-18 NOTE — Addendum Note (Signed)
Addended by: Riesa Pope on: 04/18/2022 05:26 PM   Modules accepted: Orders

## 2022-04-19 NOTE — Progress Notes (Signed)
Internal Medicine Clinic Attending  Case discussed with Dr. Katsadouros  At the time of the visit.  We reviewed the resident's history and exam and pertinent patient test results.  I agree with the assessment, diagnosis, and plan of care documented in the resident's note.  

## 2022-05-15 ENCOUNTER — Other Ambulatory Visit: Payer: Medicaid Other

## 2022-05-21 NOTE — Progress Notes (Deleted)
CC: 1 month f/u   HPI:  [name] is a 62 y.o. female with past medical history of HTN, HLD, T2DM, Hep C, Cirrhosis, ETOH abuse, OA, GAD, anxiety, basal cell carcinoma, squamous cell carcinoma in situ of the skin that presents for 1 month f/u visit.    Allergies as of 05/21/2022   No Known Allergies      Medication List        Accurate as of May 21, 2022  7:33 AM. If you have any questions, ask your nurse or doctor.          aspirin 325 MG tablet Take 325 mg by mouth every 6 (six) hours as needed for moderate pain or mild pain.   diclofenac Sodium 1 % Gel Commonly known as: Voltaren Apply 2 g topically 4 (four) times daily.   metFORMIN 500 MG 24 hr tablet Commonly known as: GLUCOPHAGE-XR Take 1 tablet (500 mg total) by mouth daily with breakfast.   naloxone 4 MG/0.1ML Liqd nasal spray kit Commonly known as: NARCAN Place 1 spray into the nose once.   oxyCODONE-acetaminophen 10-325 MG tablet Commonly known as: PERCOCET Take 1 tablet by mouth every 6 (six) hours as needed for pain.   Plenvu 140 g Solr Generic drug: PEG-KCl-NaCl-NaSulf-Na Asc-C Take 1 kit by mouth as directed.   pravastatin 40 MG tablet Commonly known as: Pravachol Take 1 tablet (40 mg total) by mouth every evening.         Past Medical History:  Diagnosis Date   Arthritis    back and bilateral knees   Factor V Leiden mutation (HCC)    Hepatitis C carrier (HCC)    Hyperlipidemia    on meds   Hypertension 08/04/2006   On and off medications-Has cost problems for her medications=HTN without complications   Melanoma (HCC)    unclear diagnosis, this is per pt report, no treatment or follow up known. per pt on left leg   Pulmonary embolism (HCC) 05/2001    On Lifelong coumidin Factor V leiden def   Seasonal allergies    Skin cancer    multiple chest and back    Subarachnoid hemorrhage [facility])    Required surgery for clipping    Suicide attempt [facility])    Review of Systems:   per HPI.   Physical Exam: *** There were no vitals filed for this visit.  *** Constitutional: Well-developed, well-nourished, appears comfortable  HENT: Normocephalic and atraumatic.  Eyes: EOM are normal. PERRL.  Neck: Normal range of motion.  Cardiovascular: Regular rate, regular rhythm. No murmurs, rubs, or gallops. Normal radial and PT pulses bilaterally. No LE edema.  Pulmonary: Normal respiratory effort. No wheezes, rales, or rhonchi.   Abdominal: Soft. Non-distended. No tenderness. Normal bowel sounds.  Musculoskeletal: Normal range of motion.     Neurological: Alert and oriented to person, place, and time. Non-focal. Skin: warm and dry.    Assessment & Plan:   At follow up will need to address cirrhosis, repeat hep C RNA testing, and factor V Leiden deficiency tx   Cirrhosis/Hep C Treated w/ Solvaldi and ribavirin in 11/2014. Has not followed up with ID since. Last hepatitis C RNA levels undetectable 09/2014. Will need repeat Hep C RNA levels to determine if cured of disease.   Plan: - Repeat Hep C RNA***  Factor V Leiden deficiency  Previously on lifelong Coumadin for PE (2001) and Factor 5 Leiden deficiency. Patient states she has not been taking Coumadin for a  few years with no issues.   Plan: - Restart coumadin 2mg  daily?*** - INR?***  3. T2DM Patient has a history of T2DM. Current medications include metFORMIN 500 MG daily. Patient states that they are *** compliant with this medication. Patient does *** check their blood sugar at home regularly and notes values ***. Patient denies polyuria, polydipsia, fatigue. Patient states that they do *** visit the ophthalmologist for yearly eye exams. A1c was 7.7% one month ago. Foot exam today ***.  Plan: - Continue metFORMIN 500 MG daily - Referral to ophthalmology*** - Repeat A1c in 2 months  4. HTN Patient has a history of HTN. Not currently on any medications for this condition. Patient states that they do *** check  their BP regularly at home. Patient denies HA, lightheadedness, dizziness, CP, or SOB. Initial BP today is ***. Repeat BP is ***.    Plan: - Start ***  5. Health Screening: pap smear,  -  - Medication refill?      See Encounters Tab for problem based charting. No problem-specific Assessment & Plan notes found for this encounter.    Patient seen with Dr. Amadeo Garnet

## 2022-06-11 ENCOUNTER — Other Ambulatory Visit: Payer: Medicaid Other

## 2022-06-18 ENCOUNTER — Encounter: Payer: Self-pay | Admitting: *Deleted

## 2022-07-13 ENCOUNTER — Other Ambulatory Visit: Payer: Self-pay | Admitting: Student

## 2022-07-13 DIAGNOSIS — E119 Type 2 diabetes mellitus without complications: Secondary | ICD-10-CM

## 2022-07-16 ENCOUNTER — Other Ambulatory Visit: Payer: Self-pay | Admitting: Student

## 2022-07-16 DIAGNOSIS — E119 Type 2 diabetes mellitus without complications: Secondary | ICD-10-CM

## 2022-07-16 NOTE — Telephone Encounter (Signed)
Pt was called to schedule an appt - no answer; left message on self-identified vm to call our office back to schedule F/U Appt. I will ask the front office to call also. Thanks

## 2022-08-16 ENCOUNTER — Encounter: Payer: Medicaid Other | Admitting: Student

## 2022-09-17 ENCOUNTER — Telehealth: Payer: Self-pay | Admitting: Dietician

## 2022-09-17 NOTE — Telephone Encounter (Signed)
Left message asking patient to tell us her eye doctor (for diabetes eye exam) if she has one when she comes into the office tomorrow.

## 2022-09-18 ENCOUNTER — Ambulatory Visit: Payer: Medicaid Other | Admitting: Student

## 2022-09-18 ENCOUNTER — Encounter: Payer: Medicaid Other | Admitting: Student

## 2022-09-18 ENCOUNTER — Encounter: Payer: Self-pay | Admitting: Student

## 2022-09-18 VITALS — BP 166/85 | HR 66 | Temp 98.1°F | Wt 128.2 lb

## 2022-09-18 DIAGNOSIS — T63301A Toxic effect of unspecified spider venom, accidental (unintentional), initial encounter: Secondary | ICD-10-CM

## 2022-09-18 DIAGNOSIS — L089 Local infection of the skin and subcutaneous tissue, unspecified: Secondary | ICD-10-CM

## 2022-09-18 DIAGNOSIS — E119 Type 2 diabetes mellitus without complications: Secondary | ICD-10-CM | POA: Diagnosis present

## 2022-09-18 DIAGNOSIS — I10 Essential (primary) hypertension: Secondary | ICD-10-CM | POA: Diagnosis not present

## 2022-09-18 DIAGNOSIS — Z7984 Long term (current) use of oral hypoglycemic drugs: Secondary | ICD-10-CM

## 2022-09-18 DIAGNOSIS — S81852A Open bite, left lower leg, initial encounter: Secondary | ICD-10-CM | POA: Diagnosis not present

## 2022-09-18 LAB — POCT GLYCOSYLATED HEMOGLOBIN (HGB A1C): Hemoglobin A1C: 6.5 % — AB (ref 4.0–5.6)

## 2022-09-18 LAB — GLUCOSE, CAPILLARY: Glucose-Capillary: 155 mg/dL — ABNORMAL HIGH (ref 70–99)

## 2022-09-18 MED ORDER — OLMESARTAN MEDOXOMIL 20 MG PO TABS
20.0000 mg | ORAL_TABLET | Freq: Every day | ORAL | Status: AC
Start: 2022-09-18 — End: 2022-10-18

## 2022-09-18 MED ORDER — CEPHALEXIN 500 MG PO CAPS: 500.0000 mg | ORAL_CAPSULE | Freq: Four times a day (QID) | ORAL | 0 refills | Status: AC

## 2022-09-18 MED ORDER — NAPROXEN 500 MG PO TABS
500.0000 mg | ORAL_TABLET | Freq: Two times a day (BID) | ORAL | 0 refills | Status: AC
Start: 2022-09-18 — End: 2022-09-25

## 2022-09-18 NOTE — Telephone Encounter (Signed)
I spoke with patient about this at her appt today and put in a referral for an eye exam. Thanks

## 2022-09-18 NOTE — Assessment & Plan Note (Addendum)
Patient's blood pressure today is 156/77 initially, 166/85 on repeat. Albumin-to-creatinine ratio was 45 in April. In the setting of diabetes with albuminuria, will recommend starting an ACE or ARB today for better BP control and reduction of glomerular hyperfiltration. Says she previously tried lisinopril but did not like how it made her feel, but she is open to trying a different kind of medication for blood pressure. She denies any other symptoms including headache, dizziness/lightheadnesss, blurry vision. Given her intolerance for lisinopril, we will trial and ARB.   Plan: -Start olmesartan 20 mg daily -Follow up in 3 months

## 2022-09-18 NOTE — Assessment & Plan Note (Deleted)
Patient presents with an insect bite on her left ankle, which she thinks was a spider.  She says it happened several weeks ago.  She was out in her yard the night before and thinks she may have been bitten by a spider.  When she woke up the next morning, her ankle was sore and she noticed 3 pimples in that area which she thought was a spider or mosquito bites. She says a week later she was cleaning the bites with a wash cloth and they ruptured, releasing a little creamy white pus.  Since then, the pain and swelling have progressively worsened; she has been taking ibuprofen 600 mg every 5 hours for pain. Says she has some numbness and tingling, which comes and goes. On exam, she has an ulcerated lesion on the lateral aspect of her left ankle, with erythema and swelling surrounding it.  The region is severely tender to touch.  Sensation and pulses are intact distal to the lesion.  She also has another area where she says she was bit on the dorsal aspect of her foot, but this area does not look infected and is less erythematous and painful.  Given his history, and the nonpurulent nature of the lesion, I think this is most likely a superficial skin infection.  Low concern for MRSA at this time.  We will give her antibiotics and wound care instructions and follow-up in 1 week.  Plan: -Keflex 500 mg 4 times daily for 1 week -Wound care materials and instructions given to patient -Naproxen 500 mg twice daily and Tylenol as needed for pain -Follow up in 1 week

## 2022-09-18 NOTE — Progress Notes (Signed)
CC: Diabetes follow up  HPI:  [name] is a 62 y.o. female living with a history stated below and presents today for diabetes follow up. Please see problem based assessment and plan for additional details.  Past Medical History:  Diagnosis Date   Arthritis    back and bilateral knees   Factor V Leiden mutation (HCC)    Hepatitis C carrier (HCC)    Hyperlipidemia    on meds   Hypertension 08/04/2006   On and off medications-Has cost problems for her medications=HTN without complications   Melanoma (HCC)    unclear diagnosis, this is per pt report, no treatment or follow up known. per pt on left leg   Pulmonary embolism (HCC) 05/2001    On Lifelong coumidin Factor V leiden def   Seasonal allergies    Skin cancer    multiple chest and back    Subarachnoid hemorrhage (HCC)    Required surgery for clipping    Suicide attempt [facility])     Current Outpatient Medications on File Prior to Visit  Medication Sig Dispense Refill   aspirin 325 MG tablet Take 325 mg by mouth every 6 (six) hours as needed for moderate pain or mild pain.     diclofenac Sodium (VOLTAREN) 1 % GEL Apply 2 g topically 4 (four) times daily. (Patient not taking: Reported on 11/30/2021) 50 g 0   metFORMIN (GLUCOPHAGE-XR) 500 MG 24 hr tablet TAKE 1 TABLET BY MOUTH EVERY DAY WITH BREAKFAST 30 tablet 11   naloxone (NARCAN) nasal spray 4 mg/0.1 mL Place 1 spray into the nose once.     oxyCODONE-acetaminophen (PERCOCET) 10-325 MG tablet Take 1 tablet by mouth every 6 (six) hours as needed for pain.     PEG-KCl-NaCl-NaSulf-Na Asc-C (PLENVU) 140 g SOLR Take 1 kit by mouth as directed. 1 each 0   pravastatin (PRAVACHOL) 40 MG tablet Take 1 tablet (40 mg total) by mouth every evening. 90 tablet 1   No current facility-administered medications on file prior to visit.    Family History  Problem Relation Age of Onset   Stroke Mother 72       brain aneurysm   Heart disease Father 82   Colon polyps Neg Hx     Colon cancer Neg Hx    Esophageal cancer Neg Hx    Stomach cancer Neg Hx    Rectal cancer Neg Hx     Social History   Socioeconomic History   Marital status: Married    Spouse name: Not on file   Number of children: Not on file   Years of education: Not on file   Highest education level: Not on file  Occupational History   Not on file  Tobacco Use   Smoking status: Former    Current packs/day: 0.00    Types: Cigarettes    Quit date: 04/23/1970    Years since quitting: 52.4   Smokeless tobacco: Never  Vaping Use   Vaping status: Never Used  Substance and Sexual Activity   Alcohol use: Yes    Alcohol/week: 36.0 standard drinks of alcohol    Types: 12 Cans of beer, 24 Standard drinks or equivalent per week    Comment: Beer occasional   Drug use: Yes    Frequency: 4.0 times per week    Types: Marijuana   Sexual activity: Not on file    Comment: lives with husband and two kids, no job, pays out of pocket, was trying for [employer]  Other Topics Concern   Not on file  Social History Narrative   Not on file   Social Determinants of Health   Financial Resource Strain: Not on file  Food Insecurity: Not on file  Transportation Needs: Not on file  Physical Activity: Not on file  Stress: Not on file  Social Connections: Not on file  Intimate Partner Violence: Not on file   Review of Systems: ROS negative except for what is noted on the assessment and plan.  Vitals:   09/18/22 0919 09/18/22 0944  BP: (!) 156/77 (!) 166/85  Pulse: 70 66  Temp: 98.1 F (36.7 C)   TempSrc: Oral   SpO2: 98%   Weight: 128 lb 3.2 oz (58.2 kg)     Physical Exam: Constitutional: well-appearing female in no acute distress HENT: normocephalic atraumatic, mucous membranes moist Eyes: conjunctiva non-erythematous Cardiovascular: regular rate and rhythm, no m/r/g; pulses in tact in distal lower extremities Pulmonary/Chest: normal work of breathing on room air, lungs clear to auscultation  bilaterally Abdominal: soft, non-tender, non-distended MSK: normal bulk and tone; soft tissue swelling in the left ankle Neurological: alert & oriented x 3; sensation present in distal extremities; some numbness/tingling in left foot Skin: dime-sized circular ulcerated lesion on the lateral aspect of the left ankle; diffuse erythema and tenderness; warm to touch; no purulence. Second lesion on dorsal aspect of left foot, slightly erythematous, non-ulcerative, no significant swelling or pain. Known cancerous lesion on the anterior left shin.  Psych: normal mood and behavior  Assessment & Plan:   Patient seen with Dr. Sol Blazing  Hypertension Patient's blood pressure today is 156/77 initially, 166/85 on repeat. Albumin-to-creatinine ratio was 45 in April. In the setting of diabetes with albuminuria, will recommend starting an ACE or ARB today for better BP control and reduction of glomerular hyperfiltration. Says she previously tried lisinopril but did not like how it made her feel, but she is open to trying a different kind of medication for blood pressure. She denies any other symptoms including headache, dizziness/lightheadnesss, blurry vision. Given her intolerance for lisinopril, we will trial and ARB.   Plan: -Start olmesartan 20 mg daily -Follow up in 3 months  Type II diabetes mellitus (HCC) A1c today is 6.5, down from 7.7 on 04/17/22.  Patient is taking metformin 500 mg inconsistently and says she has not taken it recently. Denies any side effects, just says she forgets to take it. Counseled her on leaving it in a location where she will see it and remember to take it. She says she has been trying to make diet changes and exercise more, but she has been limited recently due to her foot pain. Foot exam, from a diabetes perspective, unremarkable.   Plan: -Repeat Hgb A1c today -Continue metformin 500 mg daily -Foot exam -Ophthalmology referral  Infected bite of lower leg, left, initial  encounter Patient presents with an insect bite on her left ankle, which she thinks was a spider.  She says it happened several weeks ago.  She was out in her yard the night before and thinks she may have been bitten by a spider.  When she woke up the next morning, her ankle was sore and she noticed 3 pimples in that area which she thought was a spider or mosquito bites. She says a week later she was cleaning the bites with a wash cloth and they ruptured, releasing a little creamy white pus.  Since then, the pain and swelling have progressively worsened; she has been taking  ibuprofen 600 mg every 5 hours for pain. Says she has some numbness and tingling, which comes and goes. On exam, she has an ulcerated lesion on the lateral aspect of her left ankle, with erythema and swelling surrounding it.  The region is severely tender to touch.  Sensation and pulses are intact distal to the lesion.  She also has another area where she says she was bit on the dorsal aspect of her foot, but this area does not look infected and is less erythematous and painful.  Given his history, and the nonpurulent nature of the lesion, I think this is most likely a superficial skin infection.  Low concern for MRSA at this time.  We will give her antibiotics and wound care instructions and follow-up in 1 week.  Plan: -Keflex 500 mg 4 times daily for 1 week -Wound care materials and instructions given to patient -Naproxen 500 mg twice daily and Tylenol as needed for pain -Follow up in 1 week  Annett Fabian, MD  [facility], PGY-1 Phone: [phone] Date 09/18/2022 Time 11:53 AM

## 2022-09-18 NOTE — Assessment & Plan Note (Signed)
Patient presents with an insect bite on her left ankle, which she thinks was a spider.  She says it happened several weeks ago.  She was out in her yard the night before and thinks she may have been bitten by a spider.  When she woke up the next morning, her ankle was sore and she noticed 3 pimples in that area which she thought was a spider or mosquito bites. She says a week later she was cleaning the bites with a wash cloth and they ruptured, releasing a little creamy white pus.  Since then, the pain and swelling have progressively worsened; she has been taking ibuprofen 600 mg every 5 hours for pain. Says she has some numbness and tingling, which comes and goes. On exam, she has an ulcerated lesion on the lateral aspect of her left ankle, with erythema and swelling surrounding it.  The region is severely tender to touch.  Sensation and pulses are intact distal to the lesion.  She also has another area where she says she was bit on the dorsal aspect of her foot, but this area does not look infected and is less erythematous and painful.  Given his history, and the nonpurulent nature of the lesion, I think this is most likely a superficial skin infection.  Low concern for MRSA at this time.  We will give her antibiotics and wound care instructions and follow-up in 1 week.  Plan: -Keflex 500 mg 4 times daily for 1 week -Wound care materials and instructions given to patient -Naproxen 500 mg twice daily and Tylenol as needed for pain -Follow up in 1 week

## 2022-09-18 NOTE — Patient Instructions (Addendum)
Ms. Sarah Simon,  Thank you for coming to your appointment today at the internal medicine clinic.  You were seen for a follow-up appointment and for an infected insect bite on your ankle.  For your high blood pressure, we are starting a medication called olmesartan 20 mg daily.  For your diabetes, please place your metformin in a location in your house that you will see it and remember to take it daily.  Continue to try to reduce the amount of carbs and sweets you are consuming.  For your infected insect bite, we are prescribing a week of an antibiotic called Keflex, which you will take 4 times a day.  Please keep this wound covered and dry during this time, as the nurse discussed with you.  We are also prescribing naproxen 500 mg twice a day for pain, which you should take in addition to 1000 mg Tylenol arthritis strength up to three times a day as needed, which you can pick up from your local pharmacy.  We will schedule follow-up appointment in 1 week to monitor the progress of your infection.  If you feel your pain and swelling are significantly worsening with no improvement, please go to the emergency department.    If you have any questions, please call us at [phone].  Take care,   Annett Fabian, MD

## 2022-09-18 NOTE — Assessment & Plan Note (Addendum)
A1c today is 6.5, down from 7.7 on 04/17/22.  Patient is taking metformin 500 mg inconsistently and says she has not taken it recently. Denies any side effects, just says she forgets to take it. Counseled her on leaving it in a location where she will see it and remember to take it. She says she has been trying to make diet changes and exercise more, but she has been limited recently due to her foot pain. Foot exam, from a diabetes perspective, unremarkable.   Plan: -Repeat Hgb A1c today -Continue metformin 500 mg daily -Foot exam -Ophthalmology referral

## 2022-09-19 NOTE — Addendum Note (Signed)
Addended by: Dickie La on: 09/19/2022 02:10 PM   Modules accepted: Level of Service

## 2022-09-19 NOTE — Progress Notes (Addendum)
Internal Medicine Clinic Attending  I saw and evaluated the patient.  I personally confirmed the key portions of the history and exam documented by the resident and I reviewed pertinent patient test results.  The assessment, diagnosis, and plan were formulated together and I agree with the documentation in the resident's note. Punched-out ulcerative wound following an insect bite over left lateral ankle, superior to the malleolus, with surrounding erythema, warmth, and tenderness concerning for cellulitis. Preceding lesion and skin trauma with subsequent ulceration raises concern for ecthyma vs pyoderma. No evidence of necrotic tissue or purulence. Does not have pain in the ankle joint itself and has good ROM, low concern for septic joint. No systemic symptoms. Does not seem consistent with arterial ulcer. We discussed treatment with an oral antibiotic for cellulitis and will f/u in one week to monitor for improvement. Reasons to return to clinic sooner were outlined.

## 2022-09-25 ENCOUNTER — Encounter: Payer: Medicaid Other | Admitting: Student

## 2022-10-04 ENCOUNTER — Telehealth: Payer: Self-pay

## 2022-10-04 NOTE — Telephone Encounter (Signed)
RTC to patient told that this could be signs of the infection spreading urged to go back to an Urgent Care today as suggested before.  Will try the ER on [address].

## 2022-10-04 NOTE — Telephone Encounter (Signed)
RTC to patient .  Message left that the Clinics had returned her call.

## 2022-10-04 NOTE — Telephone Encounter (Signed)
RTC from patient would like t get another round of antibiotics for her Spider bite.  Finished the first round.  Site remains red.  Oozing yellow mucous and is painful. Missed her follow up appointment last week.  Jhonnie Garner tot Urgent care but left as the wait was to long.  Patient uses the [facility] on [address].

## 2022-10-04 NOTE — Telephone Encounter (Signed)
Pt states she would like to antibiotic refill for spider bite. Please call pt back.

## 2022-10-07 ENCOUNTER — Encounter (HOSPITAL_COMMUNITY): Payer: Self-pay

## 2022-10-07 ENCOUNTER — Ambulatory Visit (HOSPITAL_COMMUNITY)
Admission: EM | Admit: 2022-10-07 | Discharge: 2022-10-07 | Disposition: A | Discharge status: Home / Self Care | Payer: Medicaid Other | Attending: Emergency Medicine | Admitting: Emergency Medicine

## 2022-10-07 DIAGNOSIS — W57XXXD Bitten or stung by nonvenomous insect and other nonvenomous arthropods, subsequent encounter: Secondary | ICD-10-CM | POA: Diagnosis not present

## 2022-10-07 DIAGNOSIS — L03116 Cellulitis of left lower limb: Secondary | ICD-10-CM

## 2022-10-07 DIAGNOSIS — S80862D Insect bite (nonvenomous), left lower leg, subsequent encounter: Secondary | ICD-10-CM

## 2022-10-07 MED ORDER — KETOROLAC TROMETHAMINE 30 MG/ML IJ SOLN: 30.0000 mg | Freq: Once | INTRAMUSCULAR | Status: DC

## 2022-10-07 MED ORDER — IBUPROFEN 800 MG PO TABS: 800.0000 mg | ORAL_TABLET | Freq: Three times a day (TID) | ORAL | 0 refills | Status: AC | PRN

## 2022-10-07 MED ORDER — SULFAMETHOXAZOLE-TRIMETHOPRIM 800-160 MG PO TABS: 1.0000 | ORAL_TABLET | Freq: Two times a day (BID) | ORAL | 0 refills | Status: AC

## 2022-10-07 NOTE — ED Triage Notes (Signed)
Here for a spider bite to her left lower leg x 3 weeks. Pt reports pain and swelling.

## 2022-10-07 NOTE — ED Provider Notes (Signed)
[facility]    CSN: [id number] Arrival date & time: 10/07/22  1049      History   Chief Complaint Chief Complaint  Patient presents with   Insect Bite    HPI Sarah Simon is a 62 y.o. female.   Patient presents with lower leg swelling, redness, and pain.  Patient reports spider bite x 1 month ago.  Patient reports finishing antibiotic earlier this month and symptoms had somewhat resolved.  Denies fever, dizziness, and pus.     Past Medical History:  Diagnosis Date   Arthritis    back and bilateral knees   Factor V Leiden mutation (HCC)    Hepatitis C carrier (HCC)    Hyperlipidemia    on meds   Hypertension 08/04/2006   On and off medications-Has cost problems for her medications=HTN without complications   Melanoma (HCC)    unclear diagnosis, this is per pt report, no treatment or follow up known. 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   Actinic keratosis 04/17/2022   Type II diabetes mellitus (HCC) 11/13/2021   'Light-for-dates' infant with signs of fetal malnutrition 02/15/2020   Leg skin lesion, left 12/11/2015   Rash and nonspecific skin eruption 03/16/2015   Osteoarthritis 04/27/2014   Liver cirrhosis with liver nodules 12/01/2013   Preventative health care 12/16/2012   History of melanoma 06/06/2011   Alcohol abuse 02/07/2011   Lifelong Coumadin therapy 02/03/2010   Generalized anxiety disorder 09/13/2009   Depression 06/07/2009   Chronic hepatitis C without  hepatic coma (HCC) 08/04/2008   Hyperlipidemia 07/29/2008   TRANSAMINASES, SERUM, ELEVATED 05/25/2008   Hypertension 08/04/2006    Past Surgical History:  Procedure Laterality Date   CRANIOTOMY     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    OB History   No obstetric history on file.      Home Medications    Prior to Admission medications   Medication Sig Start Date End Date Taking? Authorizing Provider  aspirin 325 MG tablet Take 325 mg by mouth every 6 (six) hours as needed for moderate pain or mild pain.   Yes [provider]  ibuprofen (ADVIL) 800 MG tablet Take 1 tablet (800 mg total) by mouth 3 (three) times daily as needed for mild pain or moderate pain. 10/07/22  Yes Susann Givens, Jeremaine Maraj A, NP  metFORMIN (GLUCOPHAGE-XR) 500 MG 24 hr tablet TAKE 1 TABLET BY MOUTH EVERY DAY WITH BREAKFAST 07/17/22  Yes Rana Snare, DO  pravastatin (PRAVACHOL) 40 MG tablet Take 1 tablet (40 mg total) by mouth every evening. 04/18/22 04/18/23 Yes Katsadouros, Vasilios, MD  sulfamethoxazole-trimethoprim (BACTRIM DS) 800-160 MG tablet Take 1 tablet by mouth 2 (two) times daily for 7 days. 10/07/22 10/14/22 Yes Vivek Grealish A, NP  diclofenac Sodium (VOLTAREN) 1 % GEL Apply 2 g topically 4 (four) times daily. Patient not taking: Reported on 11/30/2021 11/12/21   Evlyn Kanner, MD  naloxone (NARCAN) nasal spray 4 mg/0.1 mL Place 1 spray into the nose once. 09/08/21   [provider]  oxyCODONE-acetaminophen (PERCOCET) 10-325 MG tablet Take 1 tablet by mouth every 6 (six) hours as needed for pain. 11/21/21   [provider]  PEG-KCl-NaCl-NaSulf-Na Asc-C (PLENVU) 140 g SOLR Take 1 kit by mouth as directed. 11/30/21   Imogene Burn, MD    Family History Family History  Problem Relation Age of Onset   Stroke Mother 84       brain aneurysm   Heart disease Father 47   Colon polyps Neg Hx    Colon cancer Neg Hx    Esophageal cancer Neg Hx    Stomach cancer Neg Hx    Rectal  cancer Neg Hx     Social History Social History   Tobacco Use   Smoking status: Former    Current packs/day: 0.00    Types: Cigarettes    Quit date: 04/23/1970    Years since quitting: 52.4   Smokeless tobacco: Never  Vaping Use   Vaping status: Never Used  Substance Use Topics   Alcohol use: Yes    Alcohol/week: 36.0 standard drinks of alcohol    Types: 12 Cans of beer, 24 Standard drinks or equivalent per week    Comment: Beer occasional   Drug use: Yes    Frequency: 4.0 times per week    Types: Marijuana     Allergies   Patient has no known allergies.   Review of Systems Review of Systems  Constitutional:  Negative for chills, fatigue and fever.  Musculoskeletal:  Negative for gait problem and joint swelling.  Skin:  Positive for color change and wound.  Neurological:  Negative for dizziness, weakness, light-headedness, numbness and headaches.     Physical Exam Triage Vital Signs ED Triage Vitals [10/07/22 1207]  Encounter Vitals Group     BP (!) 178/95     Systolic BP Percentile      Diastolic BP Percentile      Pulse Rate 64     Resp 18     Temp 97.8 F (36.6 C)     Temp Source Oral     SpO2 95 %     Weight      Height      Head Circumference      Peak Flow      Pain Score      Pain Loc      Pain Education      Exclude from Growth Chart    No data found.  Updated Vital Signs BP (!) 178/95 (BP Location: Left Arm)   Pulse 64   Temp 97.8 F (36.6 C) (Oral)   Resp 18   SpO2 95%   Visual Acuity Right Eye Distance:   Left Eye Distance:   Bilateral Distance:    Right Eye Near:   Left Eye Near:    Bilateral Near:     Physical Exam Vitals and nursing note reviewed.  Constitutional:      General: She is awake. She is not in acute distress.    Appearance: Normal appearance. She is well-developed and well-groomed. She is not ill-appearing, toxic-appearing or diaphoretic.  Cardiovascular:     Pulses:          Dorsalis pedis pulses are 2+ on  the right side and 2+ on the left side.       Posterior tibial pulses are 2+ on the right side and 2+  on the left side.  Musculoskeletal:        General: Normal range of motion.     Right lower leg: Normal.     Left lower leg: Swelling and tenderness present. No bony tenderness. 1+ Edema present.     Right ankle: Normal.     Left ankle: Swelling present.     Right foot: Normal.     Left foot: Normal.  Skin:    Findings: Erythema and wound present.     Comments: Circular 1 inch scabbed over wound to lateral aspect of L lower leg above ankle and circular 1/4 inch scabbed over wound to anterior L lower leg above ankle.   Neurological:     Mental Status: She is alert.  Psychiatric:        Behavior: Behavior is cooperative.      UC Treatments / Results  Labs (all labs ordered are listed, but only abnormal results are displayed) Labs Reviewed - No data to display  EKG   Radiology No results found.  Procedures Procedures (including critical care time)  Medications Ordered in UC Medications - No data to display  Initial Impression / Assessment and Plan / UC Course  I have reviewed the triage vital signs and the nursing notes.  Pertinent labs & imaging results that were available during my care of the patient were reviewed by me and considered in my medical decision making (see chart for details).     Patient presented with left lower leg swelling, redness, and pain.  Patient reports spider bite x 1 month ago.  Patient was prescribed Keflex on 9/4 for 7 days and completed antibiotic.  Patient states that symptoms had mostly resolved, but then started to notice increased pain and swelling about a week ago.  Denies fever, dizziness, weakness, and numbness pus.  Upon assessment patient has a circular 1 inch scabbed over wound to lateral aspect of left lower leg above ankle and another circular 1/4 inch scabbed over wound to anterior left lower leg above ankle. Mild erythema and  swelling noted to left lower leg and ankle. Slight warmth upon palpation to the left lower leg.  Pedal pulses present.  Prescribed Bactrim for cellulitis.  Recommended ibuprofen and Tylenol for pain.  Discussed return, follow-up, and emergency department precautions. Final Clinical Impressions(s) / UC Diagnoses   Final diagnoses:  Cellulitis of left lower extremity  Insect bite of left lower leg, subsequent encounter     Discharge Instructions      Start taking Bactrim as prescribed for cellulitis. You can also apply over-the-counter antibiotic ointment to affected areas and make sure areas are staying clean and dry. If you develop fever, worsening redness, swelling, pain, or notice significant amounts of pus please seek immediate medication attention in ER. Return here as needed.     ED Prescriptions     Medication Sig Dispense Auth. Provider   sulfamethoxazole-trimethoprim (BACTRIM DS) 800-160 MG tablet Take 1 tablet by mouth 2 (two) times daily for 7 days. 14 tablet Susann Givens, Carrine Kroboth A, NP   ibuprofen (ADVIL) 800 MG tablet Take 1 tablet (800 mg total) by mouth 3 (three) times daily as needed for mild pain or moderate pain. 21 tablet Wynonia Lawman A, NP      PDMP not reviewed this encounter.   Wynonia Lawman A, NP 10/07/22 1309

## 2022-10-07 NOTE — Discharge Instructions (Signed)
Start taking Bactrim as prescribed for cellulitis. You can also apply over-the-counter antibiotic ointment to affected areas and make sure areas are staying clean and dry. If you develop fever, worsening redness, swelling, pain, or notice significant amounts of pus please seek immediate medication attention in ER. Return here as needed.

## 2022-10-08 ENCOUNTER — Telehealth: Payer: Self-pay

## 2022-10-08 MED ORDER — CEPHALEXIN 500 MG PO CAPS: 500.0000 mg | ORAL_CAPSULE | Freq: Three times a day (TID) | ORAL | 0 refills | Status: AC

## 2022-10-08 NOTE — Telephone Encounter (Signed)
Pt called requesting medication change d/t n/v/d.  Per Karna Christmas, MD, "Lets change to Keflex 500mg  TID for 7 days #21, No refills." Reviewed with patient, verified pharmacy, prescription sent.

## 2023-02-26 HISTORY — PX: MOHS SURGERY: SUR867

## 2023-03-19 ENCOUNTER — Ambulatory Visit: Admitting: Internal Medicine

## 2023-03-19 VITALS — BP 163/78 | HR 91 | Temp 98.1°F | Ht 67.0 in | Wt 130.0 lb

## 2023-03-19 DIAGNOSIS — I10 Essential (primary) hypertension: Secondary | ICD-10-CM | POA: Diagnosis not present

## 2023-03-19 DIAGNOSIS — E119 Type 2 diabetes mellitus without complications: Secondary | ICD-10-CM | POA: Diagnosis not present

## 2023-03-19 DIAGNOSIS — Z85828 Personal history of other malignant neoplasm of skin: Secondary | ICD-10-CM

## 2023-03-19 DIAGNOSIS — L0390 Cellulitis, unspecified: Secondary | ICD-10-CM | POA: Diagnosis not present

## 2023-03-19 DIAGNOSIS — L989 Disorder of the skin and subcutaneous tissue, unspecified: Secondary | ICD-10-CM

## 2023-03-19 DIAGNOSIS — Z7984 Long term (current) use of oral hypoglycemic drugs: Secondary | ICD-10-CM

## 2023-03-19 LAB — POCT GLYCOSYLATED HEMOGLOBIN (HGB A1C): Hemoglobin A1C: 7.7 % — AB (ref 4.0–5.6)

## 2023-03-19 LAB — GLUCOSE, CAPILLARY: Glucose-Capillary: 157 mg/dL — ABNORMAL HIGH (ref 70–99)

## 2023-03-19 MED ORDER — SULFAMETHOXAZOLE-TRIMETHOPRIM 800-160 MG PO TABS: 1.0000 | ORAL_TABLET | Freq: Two times a day (BID) | ORAL | 0 refills | Status: AC

## 2023-03-19 MED ORDER — METFORMIN HCL 500 MG PO TABS
ORAL_TABLET | ORAL | 1 refills | Status: DC
Start: 2023-03-19 — End: 2023-04-14

## 2023-03-19 MED ORDER — KETOROLAC TROMETHAMINE 30 MG/ML IJ SOLN
30.0000 mg | Freq: Once | INTRAMUSCULAR | Status: AC
Start: 2023-03-19 — End: 2023-03-19
  Administered 2023-03-19: 30 mg via INTRAMUSCULAR

## 2023-03-19 MED ORDER — OXYCODONE-ACETAMINOPHEN 5-325 MG PO TABS: 1.0000 | ORAL_TABLET | Freq: Four times a day (QID) | ORAL | 0 refills | Status: AC | PRN

## 2023-03-19 NOTE — Assessment & Plan Note (Addendum)
 The patient has a history of basal cell carcinoma of her left lower leg that was treated with Mohs surgery on 02/26/23 (5 x 4 cm crusted plaque removed and pathology consistent with basal cell carcinoma).  The patient states that since her surgery, she has had excruciating pain in her left lower extremity.  She called her dermatologist who sent in 6 Vicodin pills, which provided her with significant relief of her pain.  Over the last couple of weeks though, she states that her wound has not been healing well and her pain is not improving at all. Her wound is purulent and she is changing the dressing every day, as it gets soaked. She has pain, especially with ambulation/movement of that leg and is visibly uncomfortable on my exam.  Additionally, around her wound, she has significant erythema, edema, and warmth to her leg - consistent with purulent cellulitis.   Plan: - Toradol 30 mg today - Start Bactrim twice daily x 7 days (may need longer course if wound does not improve) - Follow-up with dermatology ASAP - Sent in 5-day course of Percocet 5-325 mg every 6 hours as needed

## 2023-03-19 NOTE — Assessment & Plan Note (Signed)
 A1c is increased from 6.5% to 7.7% today.  She states that she normally takes metformin 500 mg daily, but has been out of this for about 2 months.  Plan: - Start metformin 500 mg daily and titrate up to 1000 mg twice daily - Follow-up in 3 months

## 2023-03-19 NOTE — Progress Notes (Signed)
 CC: leg wound  HPI:  [name] is a 63 y.o. female living with a history stated below and presents today for evaluation of her left leg wound. Please see problem based assessment and plan for additional details.  Past Medical History:  Diagnosis Date   Arthritis    back and bilateral knees   Factor V Leiden mutation (HCC)    Hepatitis C carrier (HCC)    Hyperlipidemia    on meds   Hypertension 08/04/2006   On and off medications-Has cost problems for her medications=HTN without complications   Melanoma (HCC)    unclear diagnosis, this is per pt report, no treatment or follow up known. per pt on left leg   Pulmonary embolism (HCC) 05/2001    On Lifelong coumidin Factor V leiden def   Seasonal allergies    Skin cancer    multiple chest and back    Subarachnoid hemorrhage (HCC)    Required surgery for clipping    Suicide attempt [facility])     Current Outpatient Medications on File Prior to Visit  Medication Sig Dispense Refill   aspirin 325 MG tablet Take 325 mg by mouth every 6 (six) hours as needed for moderate pain or mild pain.     diclofenac Sodium (VOLTAREN) 1 % GEL Apply 2 g topically 4 (four) times daily. (Patient not taking: Reported on 11/30/2021) 50 g 0   ibuprofen (ADVIL) 800 MG tablet Take 1 tablet (800 mg total) by mouth 3 (three) times daily as needed for mild pain or moderate pain. 21 tablet 0   naloxone (NARCAN) nasal spray 4 mg/0.1 mL Place 1 spray into the nose once.     PEG-KCl-NaCl-NaSulf-Na Asc-C (PLENVU) 140 g SOLR Take 1 kit by mouth as directed. 1 each 0   pravastatin (PRAVACHOL) 40 MG tablet Take 1 tablet (40 mg total) by mouth every evening. 90 tablet 1   No current facility-administered medications on file prior to visit.    Family History  Problem Relation Age of Onset   Stroke Mother 45       brain aneurysm   Heart disease Father 41   Colon polyps Neg Hx    Colon cancer Neg Hx    Esophageal cancer Neg Hx    Stomach cancer Neg Hx     Rectal cancer Neg Hx     Social History   Socioeconomic History   Marital status: Married    Spouse name: Not on file   Number of children: Not on file   Years of education: Not on file   Highest education level: Not on file  Occupational History   Not on file  Tobacco Use   Smoking status: Former    Current packs/day: 0.00    Types: Cigarettes    Quit date: 04/23/1970    Years since quitting: 52.9   Smokeless tobacco: Never  Vaping Use   Vaping status: Never Used  Substance and Sexual Activity   Alcohol use: Yes    Alcohol/week: 36.0 standard drinks of alcohol    Types: 12 Cans of beer, 24 Standard drinks or equivalent per week    Comment: Beer occasional   Drug use: Yes    Frequency: 4.0 times per week    Types: Marijuana   Sexual activity: Not on file    Comment: lives with husband and two kids, no job, pays out of pocket, was trying for [employer]  Other Topics Concern   Not on file  Social  History Narrative   Not on file   Social Drivers of Health   Financial Resource Strain: Not on file  Food Insecurity: Not on file  Transportation Needs: Not on file  Physical Activity: Not on file  Stress: Not on file  Social Connections: Not on file  Intimate Partner Violence: Not on file    Review of Systems: ROS negative except for what is noted on the assessment and plan.  Vitals:   03/19/23 1058  BP: (!) 163/78  Pulse: 91  Temp: 98.1 F (36.7 C)  TempSrc: Oral  SpO2: 99%  Weight: 130 lb (59 kg)  Height: 5\' 7"  (1.702 m)    Physical Exam: Constitutional: appears to be in pain/uncomfortable, but NAD Cardiovascular: regular rate and rhythm Pulmonary/Chest: normal work of breathing on room air MSK/skin: left anterior shin with large, purulent wound from previous Moh's surgical site; the site has surrounding erythema and warmth  Psych: normal mood and behavior    Assessment & Plan:    Patient discussed with Dr. Sol Blazing  History of basal cell carcinoma The  patient has a history of basal cell carcinoma of her left lower leg that was treated with Mohs surgery on 02/26/23 (5 x 4 cm crusted plaque removed and pathology consistent with basal cell carcinoma).  The patient states that since her surgery, she has had excruciating pain in her left lower extremity.  She called her dermatologist who sent in 6 Vicodin pills, which provided her with significant relief of her pain.  Over the last couple of weeks though, she states that her wound has not been healing well and her pain is not improving at all. Her wound is purulent and she is changing the dressing every day, as it gets soaked. She has pain, especially with ambulation/movement of that leg and is visibly uncomfortable on my exam.  Additionally, around her wound, she has significant erythema, edema, and warmth to her leg - consistent with purulent cellulitis.   Plan: - Toradol 30 mg today - Start Bactrim twice daily x 7 days (may need longer course if wound does not improve) - Follow-up with dermatology ASAP - Sent in 5-day course of Percocet 5-325 mg every 6 hours as needed  Type II diabetes mellitus (HCC) A1c is increased from 6.5% to 7.7% today.  She states that she normally takes metformin 500 mg daily, but has been out of this for about 2 months.  Plan: - Start metformin 500 mg daily and titrate up to 1000 mg twice daily - Follow-up in 3 months  Hypertension Blood pressure is elevated to 163/78 today.  Patient was supposed to start olmesartan 20 mg daily in September, but the prescription was never sent in.  I suspect she does have hypertension, but her blood pressure is also significantly elevated today due to the excruciating pain that she is in.  Will address hypertension at a follow-up office visit.   Elza Rafter, D.O. [facility] Internal Medicine, PGY-3 Phone: [phone] Date 03/19/2023 Time 4:48 PM

## 2023-03-19 NOTE — Assessment & Plan Note (Signed)
 Blood pressure is elevated to 163/78 today.  Patient was supposed to start olmesartan 20 mg daily in September, but the prescription was never sent in.  I suspect she does have hypertension, but her blood pressure is also significantly elevated today due to the excruciating pain that she is in.  Will address hypertension at a follow-up office visit.

## 2023-03-19 NOTE — Patient Instructions (Signed)
 Thank you, Sarah Simon for allowing [place] to provide your care today. Today we discussed:  Leg wound Start bactrim - an antibiotic- twice a day for 7 days PLEASE CALL your dermatologist to be seen ASAP Percocet (pain medicine) sent in for 5 day course Start metformin  500 mg daily x 7 days 500 mg twice daily x 7 days 1000 mg in morning and 500 mg at night x 7 days 1000 mg twice daily thereafter  I have ordered the following labs for you:  Lab Orders         Glucose, capillary         BMP8+Anion Gap         POC Hbg A1C       Referrals ordered today:   Referral Orders  No referral(s) requested today     I have ordered the following medication/changed the following medications:   Stop the following medications: Medications Discontinued During This Encounter  Medication Reason   oxyCODONE-acetaminophen (PERCOCET) 10-325 MG tablet    metFORMIN (GLUCOPHAGE-XR) 500 MG 24 hr tablet      Start the following medications: Meds ordered this encounter  Medications   ketorolac (TORADOL) 30 MG/ML injection 30 mg   oxyCODONE-acetaminophen (PERCOCET) 5-325 MG tablet    Sig: Take 1 tablet by mouth every 6 (six) hours as needed for up to 5 days for severe pain (pain score 7-10).    Dispense:  20 tablet    Refill:  0   sulfamethoxazole-trimethoprim (BACTRIM DS) 800-160 MG tablet    Sig: Take 1 tablet by mouth 2 (two) times daily for 7 days.    Dispense:  14 tablet    Refill:  0   metFORMIN (GLUCOPHAGE) 500 MG tablet    Sig: Take 1 tablet (500 mg total) by mouth daily with breakfast for 7 days, THEN 1 tablet (500 mg total) 2 (two) times daily with a meal for 7 days, THEN 2 tablets (1,000 mg total) with breakfast and 1 tablet (500 mg) with dinner for 7 days, THEN 2 tablets (1,000 mg total) 2 (two) times daily with a meal for 7 days.    Dispense:  90 tablet    Refill:  1     Follow up:  4 weeks     Should you have any questions or concerns please call the internal medicine  clinic at [phone].     Elza Rafter, D.O. [facility]

## 2023-03-20 LAB — BMP8+ANION GAP
Anion Gap: 20 mmol/L — ABNORMAL HIGH (ref 10.0–18.0)
BUN/Creatinine Ratio: 22 (ref 12–28)
BUN: 13 mg/dL (ref 8–27)
CO2: 22 mmol/L (ref 20–29)
Calcium: 9.4 mg/dL (ref 8.7–10.3)
Chloride: 99 mmol/L (ref 96–106)
Creatinine, Ser: 0.58 mg/dL (ref 0.57–1.00)
Glucose: 137 mg/dL — ABNORMAL HIGH (ref 70–99)
Potassium: 3.7 mmol/L (ref 3.5–5.2)
Sodium: 141 mmol/L (ref 134–144)
eGFR: 102 mL/min/{1.73_m2} (ref >59)

## 2023-03-20 NOTE — Progress Notes (Signed)
 Internal Medicine Clinic Attending  Case discussed with the resident at the time of the visit.  We reviewed the resident's history and exam and pertinent patient test results.  I agree with the assessment, diagnosis, and plan of care documented in the resident's note.

## 2023-03-20 NOTE — Progress Notes (Signed)
 Called patient about BMP. Kidney function/electrolytes wnl. She is still yet to call her dermatologist to make a follow up appt, but I stressed the importance of doing this ASAP.

## 2023-03-24 ENCOUNTER — Other Ambulatory Visit: Payer: Self-pay | Admitting: Student

## 2023-03-24 DIAGNOSIS — Z85828 Personal history of other malignant neoplasm of skin: Secondary | ICD-10-CM

## 2023-03-24 DIAGNOSIS — L989 Disorder of the skin and subcutaneous tissue, unspecified: Secondary | ICD-10-CM

## 2023-03-24 NOTE — Telephone Encounter (Signed)
 Copied from CRM [id number]. Topic: Clinical - Medication Refill >> Mar 24, 2023  2:50 PM Corin V wrote: Most Recent Primary Care Visit:  Provider: Chauncey Mann  Department: [facility]  Visit Type: OPEN ESTABLISHED  Date: 03/19/2023  Medication: oxyCODONE-acetaminophen (PERCOCET) 5-325 MG tablet  Has the patient contacted their pharmacy? Yes (Agent: If no, request that the patient contact the pharmacy for the refill. If patient does not wish to contact the pharmacy document the reason why and proceed with request.) (Agent: If yes, when and what did the pharmacy advise?)  Is this the correct pharmacy for this prescription? Yes If no, delete pharmacy and type the correct one.  This is the patient's preferred pharmacy:  [id number] [place], [place] - [address] [place] [address] Phone: [phone] Fax: [phone]   Has the prescription been filled recently? No  Is the patient out of the medication? Yes  Has the patient been seen for an appointment in the last year OR does the patient have an upcoming appointment? Yes  Can we respond through MyChart? No  Agent: Please be advised that Rx refills may take up to 3 business days. We ask that you follow-up with your pharmacy.

## 2023-03-25 ENCOUNTER — Ambulatory Visit: Payer: Self-pay | Admitting: Student

## 2023-03-25 NOTE — Telephone Encounter (Signed)
 Pt called back -front office schedule pt first available appt 3/17. Also informed Oxycodone refill denied d/t pt needs an appt.

## 2023-03-25 NOTE — Telephone Encounter (Signed)
 Copied from CRM [id number]. Topic: Clinical - Red Word Triage >> Mar 25, 2023  2:57 PM Corin V wrote: [facility] that prompted transfer to Nurse Triage: Patient is still having severe pain in her left leg where her "cancer was cut out" and is needing a refill on her percocet. Refill request sent in yesterday, but she is still having severe pain.   Chief Complaint: leg pain Symptoms: 8/10 pain Frequency: constant Pertinent Negatives: Patient denies fever, redness, or swelling Disposition: [] ED /[] Urgent Care (no appt availability in office) / [] Appointment(In office/virtual)/ []  [place] [facility]/ [] Home Care/ [x] Refused Recommended Disposition /[] [place] [facility]/ [x]  Follow-up with PCP Additional Notes: Pt had cancer cut out on 2/12, now having 8/10 lower left leg pain. She was seen on 3/6, started on Bactrim for cellulitis, states that she has one more day left, but she is completely out of Percocet. Pt requesting refill for pain medication. RN advised pt that she needs to be re-evaluated, pt refused. Sts that she'd prefer to keep her upcoming appt and just get a medication refill at this time. Will route HP to clinic for f/u Reason for Disposition  [1] SEVERE pain (e.g., excruciating, unable to do any normal activities) AND [2] not improved after 2 hours of pain medicine  Answer Assessment - Initial Assessment Questions 1. ONSET: "When did the pain start?"      Last 2 days  2. LOCATION: "Where is the pain located?"      Lower left leg, in a 4x6 pattern  3. PAIN: "How bad is the pain?"    (Scale 1-10; or mild, moderate, severe)   -  MILD (1-3): doesn't interfere with normal activities    -  MODERATE (4-7): interferes with normal activities (e.g., work or school) or awakens from sleep, limping    -  SEVERE (8-10): excruciating pain, unable to do any normal activities, unable to walk     9/10 pain  4. WORK OR EXERCISE: "Has there been any recent work or exercise that involved this  part of the body?"      No recent work or injury  5. CAUSE: "What do you think is causing the leg pain?"     Had cancer removed on 2/12  6. OTHER SYMPTOMS: "Do you have any other symptoms?" (e.g., chest pain, back pain, breathing difficulty, swelling, rash, fever, numbness, weakness)     No other symptoms  7. PREGNANCY: "Is there any chance you are pregnant?" "When was your last menstrual period?"     no  Protocols used: Leg Pain-A-AH

## 2023-03-25 NOTE — Telephone Encounter (Addendum)
 Error

## 2023-03-25 NOTE — Telephone Encounter (Signed)
 Patient has been scheduled to come in on Monday 3/17. Patient stated she is in excruciating pain and needs her pain medication. Patient wants to know what she can do until she is able to get her medication refilled. She has been taking advil which is not relieving her pain.

## 2023-03-25 NOTE — Telephone Encounter (Signed)
 Called pt to f/u leg pain - no answer; left message to office's return call.

## 2023-03-31 ENCOUNTER — Encounter: Admitting: Student

## 2023-04-02 ENCOUNTER — Ambulatory Visit: Payer: Self-pay | Admitting: Student

## 2023-04-02 NOTE — Telephone Encounter (Signed)
 Chief Complaint: left leg post op wound infection Symptoms: redness, swelling, drainage and pain to left lower leg wound Frequency: since yesterday Pertinent Negatives: Patient denies fever, rash,  Disposition: [] ED /[] Urgent Care (no appt availability in office) / [x] Appointment(In office/virtual)/ []  [place] [facility]/ [] Home Care/ [] Refused Recommended Disposition /[] [place] [facility]/ []  Follow-up with PCP Additional Notes: Patient states she had surgery on 02/26/23 at [facility]. She states she has not contacted them and does not wish to see the doctor again. Patient states she was placed on antibiotics for post op wound infection 2-3 weeks ago and completed the antibiotics. She states since yesterday she feels like the wound looks infected, foul odor. Patient was triaged last week and scheduled for an appt and she states she was not able to make it to the appt.  Patient states she has no transportation today and states she can not make it to any office visit, urgent care or ED today. Patient agreeable to acute visit tomorrow and understands for any worsening or new symptoms she needs to go to urgent care or ED.  Copied from CRM [id number]. Topic: Clinical - Red Word Triage >> Apr 02, 2023  9:53 AM Everette Rank wrote: Red Word that prompted transfer to Nurse Triage: Patient Surgery on LT lower leg. Needs medication due to it may be infected. Last 2-3 days getting worse. Reason for Disposition  [1] Incision looks infected (spreading redness, pain) AND [2] large red area (> 2 in. or 5 cm)  Answer Assessment - Initial Assessment Questions 1. SYMPTOM: "What's the main symptom you're concerned about?" (e.g., drainage, incision opening up, pain, redness)     Drainage, painful, swelling, foul odor.  2. ONSET: "When did symptoms  start?"     She states on Monday the wound looked improved but yesterday it was worse.  3. SURGERY: "What surgery did you have?"     MOHS  surgery.  4. DATE of SURGERY: "When was the surgery?"      02/26/23.  5. INCISION SITE: "Where is the incision located?"      Left lower leg, anterior just below knee.  6. REDNESS: "Is there any redness at the incision site?" If Yes, ask: "How wide across is the redness?" (Inches, centimeters)      Yes, she states her ankle is starting to get red again. 2x4 inches.  7. PAIN: "Is there any pain?" If Yes, ask: "How bad is it?"  (Scale 1-10; or mild, moderate, severe)   - NONE (0): no pain   - MILD (1-3): doesn't interfere with normal activities    - MODERATE (4-7): interferes with normal activities or awakens from sleep    - SEVERE (8-10): excruciating pain, unable to do any normal activities     10/10. Last dose of Advil was at 0600 this morning.  8. BLEEDING: "Is there any bleeding?" If Yes, ask: "How much?" and "Where?"     She states there is no much bleeding drainage.  9. DRAINAGE: "Is there any drainage from the incision site?" If Yes, ask: "What color and how much?" (e.g., red, cloudy, pus; drops, teaspoon)     Yes, red and yellow drainage. She states every 6-8 hours she has to change a 4x4 band aid.  10. FEVER: "Do you have a fever?" If Yes, ask: "What is your temperature, how was it measured, and when did it start?"       Denies checking her temperature.  11. OTHER  SYMPTOMS: "Do you have any other symptoms?" (e.g., dizziness, rash elsewhere on body, shaking chills, weakness)       Chills, weakness in left leg.  Protocols used: Post-Op Incision Symptoms and Questions-A-AH

## 2023-04-03 ENCOUNTER — Ambulatory Visit: Payer: Self-pay | Admitting: Student

## 2023-04-03 VITALS — BP 151/81 | HR 69

## 2023-04-03 DIAGNOSIS — R110 Nausea: Secondary | ICD-10-CM | POA: Diagnosis not present

## 2023-04-03 DIAGNOSIS — T8149XA Infection following a procedure, other surgical site, initial encounter: Secondary | ICD-10-CM

## 2023-04-03 DIAGNOSIS — L989 Disorder of the skin and subcutaneous tissue, unspecified: Secondary | ICD-10-CM

## 2023-04-03 MED ORDER — SULFAMETHOXAZOLE-TRIMETHOPRIM 800-160 MG PO TABS: 1.0000 | ORAL_TABLET | Freq: Two times a day (BID) | ORAL | 0 refills | Status: AC

## 2023-04-03 MED ORDER — ONDANSETRON 4 MG PO TBDP
4.0000 mg | ORAL_TABLET | Freq: Two times a day (BID) | ORAL | 0 refills | Status: DC | PRN
Start: 2023-04-03 — End: 2023-08-07

## 2023-04-03 MED ORDER — OXYCODONE HCL 5 MG PO TABS: 5.0000 mg | ORAL_TABLET | Freq: Four times a day (QID) | ORAL | 0 refills | Status: AC | PRN

## 2023-04-03 MED ORDER — BACITRACIN ZINC 500 UNIT/GM EX OINT: TOPICAL_OINTMENT | CUTANEOUS | 2 refills | Status: DC

## 2023-04-03 NOTE — Assessment & Plan Note (Addendum)
 This is a 63 year old female with past medical history of cirrhosis, removal of multiple skin lesions, and factor V Leiden who underwent Mohs surgery for basal cell carcinoma and subsequently experienced purulent cellulitis at the surgical site.  She was last seen in our clinic on 03/19/2023 at which time she was given a prescription for 7 days of Bactrim, 5 days of Percocet, and was instructed to follow-up with dermatology.  She states after while taking her antibiotics, her pain and wound appearance improved.  However since March 15, she states that the pain, swelling, redness have again worsened and she has noticed increased drainage.  She has been taking her metformin as prescribed, but states that she gets nauseous when she was taking her Bactrim.  She does state that she would take this medication on empty stomach and sometimes forget to take her p.m. doses.  She has been cleaning the wound, applying Vaseline around the edges and applying a dressing over this.  Kindly see photos in the media tab. On exam today, there is continued purulence of the wound.  Erythema may be slightly improved from prior, however overall the wound does not look improved compared to photos in the media tab.  Dependent swelling of the left lower extremity, no pain with range of motion of the knee or ankle.  Plan: Initiate double strength Bactrim for 14 days As needed Zofran for nausea Discussed taking medication with food Bacitracin ointment for dressing changes Urgent wound care consultation Oxycodone every 6 hours for 5 days Follow-up 2 weeks for repeat evaluation. Encouraged patient to follow-up with dermatology

## 2023-04-03 NOTE — Patient Instructions (Addendum)
 Thank you, Ms.[name] for allowing [place] to provide your care today.   Referrals ordered today:   Referral Orders         AMB referral to wound care center      I have ordered the following medication/changed the following medications:   Start the following medications: Meds ordered this encounter  Medications   bacitracin ointment    Sig: Apply to affected area daily    Dispense:  113 g    Refill:  2   sulfamethoxazole-trimethoprim (BACTRIM DS) 800-160 MG tablet    Sig: Take 1 tablet by mouth 2 (two) times daily for 14 days.    Dispense:  28 tablet    Refill:  0   oxyCODONE (ROXICODONE) 5 MG immediate release tablet    Sig: Take 1 tablet (5 mg total) by mouth every 6 (six) hours as needed for up to 5 days for severe pain (pain score 7-10).    Dispense:  20 tablet    Refill:  0   ondansetron (ZOFRAN-ODT) 4 MG disintegrating tablet    Sig: Take 1 tablet (4 mg total) by mouth every 12 (twelve) hours as needed for nausea or vomiting.    Dispense:  28 tablet    Refill:  0       Follow up:  2 weeks  for wound check  Please remember:  If you have any symptoms of increased weakness, lethargy, or confusion please stop taking your oxycodone and seek medical attention. We will see you in 2 weeks for a wound check, you should also establish with the wound care clinic in the interim. I would recommend that you reach out to your dermatologist as well.  Please make sure to take your antibiotic with food to avoid stomach upset.    We look forward to seeing you next time. Please call our clinic at [phone] if you have any questions or concerns. The best time to call is Monday-Friday from 9am-4pm, but there is someone available 24/7. If after hours or the weekend, call the main hospital number and ask for the Internal Medicine Resident On-Call. If you need medication refills, please notify your pharmacy one week in advance and they will send [place] a request.   Thank you for  trusting me with your care. Wishing you the best!  Lovie Macadamia MD [facility]

## 2023-04-03 NOTE — Progress Notes (Signed)
 Subjective:  CC: Wound follow up  HPI:  [name] is a 63 y.o. person with a past medical history stated below and presents today for the stated chief complaint. Please see problem based assessment and plan for additional details.  Past Medical History:  Diagnosis Date   Arthritis    back and bilateral knees   Factor V Leiden mutation (HCC)    Hepatitis C carrier (HCC)    Hyperlipidemia    on meds   Hypertension 08/04/2006   On and off medications-Has cost problems for her medications=HTN without complications   Melanoma (HCC)    unclear diagnosis, this is per pt report, no treatment or follow up known. per pt on left leg   Pulmonary embolism (HCC) 05/2001    On Lifelong coumidin Factor V leiden def   Seasonal allergies    Skin cancer    multiple chest and back    Subarachnoid hemorrhage (HCC)    Required surgery for clipping    Suicide attempt [facility])     Current Outpatient Medications on File Prior to Visit  Medication Sig Dispense Refill   aspirin 325 MG tablet Take 325 mg by mouth every 6 (six) hours as needed for moderate pain or mild pain.     diclofenac Sodium (VOLTAREN) 1 % GEL Apply 2 g topically 4 (four) times daily. (Patient not taking: Reported on 11/30/2021) 50 g 0   ibuprofen (ADVIL) 800 MG tablet Take 1 tablet (800 mg total) by mouth 3 (three) times daily as needed for mild pain or moderate pain. 21 tablet 0   metFORMIN (GLUCOPHAGE) 500 MG tablet Take 1 tablet (500 mg total) by mouth daily with breakfast for 7 days, THEN 1 tablet (500 mg total) 2 (two) times daily with a meal for 7 days, THEN 2 tablets (1,000 mg total) with breakfast and 1 tablet (500 mg) with dinner for 7 days, THEN 2 tablets (1,000 mg total) 2 (two) times daily with a meal for 7 days. 90 tablet 1   naloxone (NARCAN) nasal spray 4 mg/0.1 mL Place 1 spray into the nose once.     PEG-KCl-NaCl-NaSulf-Na Asc-C (PLENVU) 140 g SOLR Take 1 kit by mouth as directed. 1 each 0   pravastatin  (PRAVACHOL) 40 MG tablet Take 1 tablet (40 mg total) by mouth every evening. 90 tablet 1   No current facility-administered medications on file prior to visit.    Review of Systems: Please see assessment and plan for pertinent positives and negatives.  Objective:   Vitals:   04/03/23 0927 04/03/23 0945  BP: (!) 160/81 (!) 151/81  Pulse: 70 69    Physical Exam: Constitutional: Well-appearing Extremities: Kindly see photos in the media tab. Continued purulence of the wound.  Surrounding erythema and dependent swelling of the left lower extremity, no pain with range of motion of the knee or ankle.  No focal fluid collections or expressible purulence appreciated. Psych: Pleasant affect Thought process is linear and is goal-directed.     Assessment & Plan:  Wound infection following procedure This is a 63 year old female with past medical history of cirrhosis, removal of multiple skin lesions, and factor V Leiden who underwent Mohs surgery for basal cell carcinoma and subsequently experienced purulent cellulitis at the surgical site.  She was last seen in our clinic on 03/19/2023 at which time she was given a prescription for 7 days of Bactrim, 5 days of Percocet, and was instructed to follow-up with dermatology.  She states  after while taking her antibiotics, her pain and wound appearance improved.  However since March 15, she states that the pain, swelling, redness have again worsened and she has noticed increased drainage.  She has been taking her metformin as prescribed, but states that she gets nauseous when she was taking her Bactrim.  She does state that she would take this medication on empty stomach and sometimes forget to take her p.m. doses.  She has been cleaning the wound, applying Vaseline around the edges and applying a dressing over this.  Kindly see photos in the media tab. On exam today, there is continued purulence of the wound.  Erythema may be slightly improved from  prior, however overall the wound does not look improved compared to photos in the media tab.  Dependent swelling of the left lower extremity, no pain with range of motion of the knee or ankle.  Plan: Initiate double strength Bactrim for 14 days As needed Zofran for nausea Discussed taking medication with food Bacitracin ointment for dressing changes Urgent wound care consultation Oxycodone every 6 hours for 5 days Follow-up 2 weeks for repeat evaluation. Encouraged patient to follow-up with dermatology    Patient discussed with Dr. Alver Sorrow MD [facility] Internal Medicine  PGY-1 Pager: [phone]  Phone: [phone] Date 04/03/2023  Time 10:32 AM

## 2023-04-08 ENCOUNTER — Telehealth: Payer: Self-pay | Admitting: *Deleted

## 2023-04-08 DIAGNOSIS — K7460 Unspecified cirrhosis of liver: Secondary | ICD-10-CM

## 2023-04-08 NOTE — Telephone Encounter (Signed)
 Order placed thank you!

## 2023-04-08 NOTE — Telephone Encounter (Signed)
 Copied from CRM [id number]. Topic: Referral - Question >> Apr 08, 2023 11:29 AM Maryann Alar wrote: Reason for CRM: patient is wanting a referral to whoever specializes in cirrhosis

## 2023-04-11 ENCOUNTER — Other Ambulatory Visit: Payer: Self-pay | Admitting: Internal Medicine

## 2023-04-11 DIAGNOSIS — E119 Type 2 diabetes mellitus without complications: Secondary | ICD-10-CM

## 2023-04-12 NOTE — Progress Notes (Signed)
 Internal Medicine Clinic Attending  Case discussed with the resident at the time of the visit.  We reviewed the resident's history and exam and pertinent patient test results.  I agree with the assessment, diagnosis, and plan of care documented in the resident's note.

## 2023-04-18 ENCOUNTER — Ambulatory Visit (HOSPITAL_BASED_OUTPATIENT_CLINIC_OR_DEPARTMENT_OTHER): Admitting: Internal Medicine

## 2023-04-28 ENCOUNTER — Ambulatory Visit: Payer: Self-pay

## 2023-04-28 NOTE — Telephone Encounter (Signed)
 Copied from CRM [id number]. Topic: Clinical - Red Word Triage >> Apr 28, 2023 12:06 PM Sarah Simon wrote: Red Word that prompted transfer to Nurse Triage: Cut on lower left leg from surgery, looking red and purple, also have yellow stuff oozing out of cut and it's a lot. Also in a lot of pain.  Chief Complaint: states had surgery weeks ago and has not followed up with surgery yet. States she has a cut on her leg that was cancerous. Symptoms: states area is painful, draining yellow and edges look like they are turning black Frequency: since surgery Pertinent Negatives: Patient denies fever, numbness Disposition: [x] ED /[] Urgent Care (no appt availability in office) / [] Appointment(In office/virtual)/ []  [place] [facility]/ [] Home Care/ [] Refused Recommended Disposition /[]  Mobile Bus/ []  Follow-up with PCP Additional Notes: per protocol instructed to go to ER.  PCP office updated.   Reason for Disposition  Black (necrotic) or blisters develop in wound  Answer Assessment - Initial Assessment Questions 1. LOCATION: "Where is the wound located?"      Lower left leg 2. WOUND APPEARANCE: "What does the wound look like?"      Looks red and purple; has yellow stuff oozing out of cut 3. SIZE: If redness is present, ask: "What is the size of the red area?" (Inches, centimeters, or compare to size of a coin)      2x1.5 inches 4. SPREAD: "What's changed in the last day?"  "Do you see any red streaks coming from the wound?"     denies 5. ONSET: "When did it start to look infected?"      The day after surgery 6. MECHANISM: "How did the wound start, what was the cause?"     cancer 7. PAIN: "Is there any pain?" If Yes, ask: "How bad is the pain?"   (Scale 1-10; or mild, moderate, severe)     To lower leg; moderate 8. FEVER: "Do you have a fever?" If Yes, ask: "What is your temperature, how was it measured, and when did it start?"     denies 9. OTHER SYMPTOMS: "Do you have any other  symptoms?" (e.g., shaking chills, weakness, rash elsewhere on body)     Chills, weakness 10. PREGNANCY: "Is there any chance you are pregnant?" "When was your last menstrual period?"       na  Protocols used: Wound Infection-A-AH

## 2023-04-29 NOTE — Telephone Encounter (Signed)
 I called pt who stated she did not go to the ED d/t transportation. I asked if she had called the doctor's office who did her surgery; stated she did not - stated she does not want to go back. She [facility] but overslept; appt re-schedule May 21. Pt wants to schedule an appt with her PCP for a check-up - appt scheduled w/Dr Arellano Thursday 4/17 @ 1415PM.

## 2023-04-29 NOTE — Telephone Encounter (Signed)
 Please schedule pt an appt. Per Dr Jari Merles. Thanks

## 2023-05-01 ENCOUNTER — Encounter: Admitting: Student

## 2023-05-08 ENCOUNTER — Ambulatory Visit: Payer: Self-pay | Admitting: Internal Medicine

## 2023-05-13 ENCOUNTER — Encounter: Admitting: Student

## 2023-06-04 ENCOUNTER — Ambulatory Visit (HOSPITAL_BASED_OUTPATIENT_CLINIC_OR_DEPARTMENT_OTHER): Admitting: Internal Medicine

## 2023-06-05 ENCOUNTER — Encounter: Payer: Self-pay | Admitting: Internal Medicine

## 2023-06-16 ENCOUNTER — Ambulatory Visit: Payer: Self-pay | Admitting: Student

## 2023-08-07 ENCOUNTER — Ambulatory Visit: Admitting: Family Medicine

## 2023-08-07 ENCOUNTER — Encounter: Payer: Self-pay | Admitting: Family Medicine

## 2023-08-07 ENCOUNTER — Other Ambulatory Visit: Payer: Self-pay | Admitting: Family Medicine

## 2023-08-07 VITALS — BP 138/82 | HR 93 | Temp 98.3°F | Ht 67.0 in | Wt 114.1 lb

## 2023-08-07 DIAGNOSIS — Z0000 Encounter for general adult medical examination without abnormal findings: Secondary | ICD-10-CM | POA: Insufficient documentation

## 2023-08-07 DIAGNOSIS — Z0001 Encounter for general adult medical examination with abnormal findings: Secondary | ICD-10-CM

## 2023-08-07 DIAGNOSIS — R1110 Vomiting, unspecified: Secondary | ICD-10-CM

## 2023-08-07 DIAGNOSIS — F1010 Alcohol abuse, uncomplicated: Secondary | ICD-10-CM

## 2023-08-07 DIAGNOSIS — R197 Diarrhea, unspecified: Secondary | ICD-10-CM

## 2023-08-07 DIAGNOSIS — I10 Essential (primary) hypertension: Secondary | ICD-10-CM

## 2023-08-07 DIAGNOSIS — Z13220 Encounter for screening for lipoid disorders: Secondary | ICD-10-CM

## 2023-08-07 DIAGNOSIS — Z1211 Encounter for screening for malignant neoplasm of colon: Secondary | ICD-10-CM

## 2023-08-07 DIAGNOSIS — S81802S Unspecified open wound, left lower leg, sequela: Secondary | ICD-10-CM

## 2023-08-07 DIAGNOSIS — S81809S Unspecified open wound, unspecified lower leg, sequela: Secondary | ICD-10-CM | POA: Insufficient documentation

## 2023-08-07 DIAGNOSIS — Z1231 Encounter for screening mammogram for malignant neoplasm of breast: Secondary | ICD-10-CM

## 2023-08-07 DIAGNOSIS — E119 Type 2 diabetes mellitus without complications: Secondary | ICD-10-CM

## 2023-08-07 DIAGNOSIS — E785 Hyperlipidemia, unspecified: Secondary | ICD-10-CM

## 2023-08-07 MED ORDER — ONDANSETRON 8 MG PO TBDP: 8.0000 mg | ORAL_TABLET | Freq: Three times a day (TID) | ORAL | 3 refills | Status: AC | PRN

## 2023-08-07 MED ORDER — SANTYL 250 UNIT/GM EX OINT: 1.0000 | TOPICAL_OINTMENT | Freq: Every day | CUTANEOUS | 0 refills | Status: AC

## 2023-08-07 MED ORDER — SULFAMETHOXAZOLE-TRIMETHOPRIM 800-160 MG PO TABS: 1.0000 | ORAL_TABLET | Freq: Two times a day (BID) | ORAL | 0 refills | Status: AC

## 2023-08-07 NOTE — Assessment & Plan Note (Signed)
 Stool studies, CMP ordered. Start Zofran  8mg  q8h PRN. Advised soft, bland diet, push electrolyte fluids. Seek medical care if symptoms worsen or persist.

## 2023-08-07 NOTE — Progress Notes (Signed)
 New Patient Office Visit  Subjective    Patient ID: Sarah Simon, female    DOB: Jul 16, 1960  Age: 63 y.o. MRN: [id number]  CC:  Chief Complaint  Patient presents with   Medical Management of Chronic Issues   Establish Care    HPI Sarah Simon presents to establish care. Oriented to practice routines and expectations. Has not been seeing PCP regularly. PMH include DM2, HTN. BCC, HLD, PE and Factor V Leiden on Coumadin , cerebral aneurysm. Chronic conditions uncontrolled and she is non-compliant with medications. Experienced nausea with metformin  and unable to tolerate.  Sarah Simon has a LLE wound to her shin that is approx 6cm long by 3cm wide with surrounding erythema and purulent drainage. The wound bed has black necrotic tissue. This wound started as a surgical wound from a Mohs procedure, she has not followed up with that Derm. Has seen her PCP locally who recommended wound care however she did not follow up on this either. That was in March. She has been keeping the area clean and dry. No fever, chills, body aches however she does endorse fatigue, chronic pain, and 5 days of vomiting and diarrhea. She denies abdominal pain, melena, hematochezia, hematemesis. It sounds more like nausea and spitting up clear phlegm however she has been unable to eat or keep food down the past 3 days. Has had about 1-3 episodes of brown diarrhea daily. She does drink ETOH 3 beers daily and take high doses of Ibuprofen  for her wound pain.  Breast CA screening: due Cervical CA screening: due Colon CA screening: due Tobacco: non-smoker STI: declines Vaccines: PNA, shingles due  HTN: BP elevated today in office, previously prescribed Olmesartan  however never started, denies chest pain, palpitations, recurrent headaches, vision changes, lightheadedness, dizziness, dyspnea on exertion, or swelling of extremities.  DM: Last A1c 7.7%, intolerant of Metformin , denies polyuria, polydypsia, chest  pain, paresthesias, or chest pain.  Factor V Leiden with hx of PE: non-compliant with Coumadin   ETOH use: 3 beer daily   Outpatient Encounter Medications as of 08/07/2023  Medication Sig   collagenase  (SANTYL ) 250 UNIT/GM ointment Apply 1 Application topically daily.   ibuprofen  (ADVIL ) 800 MG tablet Take 1 tablet (800 mg total) by mouth 3 (three) times daily as needed for mild pain or moderate pain.   ondansetron  (ZOFRAN -ODT) 8 MG disintegrating tablet Take 1 tablet (8 mg total) by mouth every 8 (eight) hours as needed for nausea.   sulfamethoxazole -trimethoprim  (BACTRIM  DS) 800-160 MG tablet Take 1 tablet by mouth 2 (two) times daily.   [DISCONTINUED] aspirin 325 MG tablet Take 325 mg by mouth every 6 (six) hours as needed for moderate pain or mild pain.   [DISCONTINUED] bacitracin  ointment Apply to affected area daily   [DISCONTINUED] diclofenac  Sodium (VOLTAREN ) 1 % GEL Apply 2 g topically 4 (four) times daily. (Patient not taking: Reported on 11/30/2021)   [DISCONTINUED] metFORMIN  (GLUCOPHAGE ) 1000 MG tablet Take 1 tablet (1,000 mg total) by mouth 2 (two) times daily with a meal.   [DISCONTINUED] naloxone (NARCAN) nasal spray 4 mg/0.1 mL Place 1 spray into the nose once.   [DISCONTINUED] ondansetron  (ZOFRAN -ODT) 4 MG disintegrating tablet Take 1 tablet (4 mg total) by mouth every 12 (twelve) hours as needed for nausea or vomiting.   [DISCONTINUED] PEG-KCl-NaCl-NaSulf-Na Asc-C (PLENVU ) 140 g SOLR Take 1 kit by mouth as directed.   [DISCONTINUED] pravastatin  (PRAVACHOL ) 40 MG tablet Take 1 tablet (40 mg total) by mouth every evening.   No facility-administered encounter  medications on file as of 08/07/2023.    Past Medical History:  Diagnosis Date   Arthritis    back and bilateral knees   Factor V Leiden mutation (HCC)    Hepatitis C carrier (HCC)    Hyperlipidemia    on meds   Hypertension 08/04/2006   On and off medications-Has cost problems for her medications=HTN without  complications   Melanoma (HCC)    unclear diagnosis, this is per pt report, no treatment or follow up known. per pt on left leg   Pulmonary embolism (HCC) 05/2001    On Lifelong coumidin Factor V leiden def   Seasonal allergies    Skin cancer    multiple chest and back    Subarachnoid hemorrhage [facility])    Required surgery for clipping    Suicide attempt [facility])     Past Surgical History:  Procedure Laterality Date   CRANIOTOMY     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    Family History  Problem Relation Age of Onset   Stroke Mother 3       brain aneurysm   Heart disease Father 60   Colon polyps Neg Hx    Colon cancer Neg Hx    Esophageal cancer Neg Hx    Stomach cancer Neg Hx    Rectal cancer Neg Hx     Social History   Socioeconomic History   Marital status: Married    Spouse name: Not on file   Number of children: Not on file   Years of education: Not on file   Highest education level: Not on file  Occupational History   Not on file  Tobacco Use   Smoking status: Former    Current packs/day: 0.00    Types: Cigarettes    Quit date: 04/23/1970    Years since quitting: 53.3   Smokeless tobacco: Never  Vaping Use   Vaping status: Never Used  Substance and Sexual Activity   Alcohol use: Yes    Alcohol/week: 36.0 standard drinks of alcohol    Types: 12 Cans of beer, 24 Standard drinks or equivalent per week    Comment: Beer occasional   Drug use: Yes    Frequency: 4.0 times per week    Types: Marijuana   Sexual activity: Not on file    Comment: lives with husband and two kids, no job, pays out of pocket, was trying for [employer]  Other Topics Concern   Not on file  Social History Narrative   Not on file   Social Drivers of Health   Financial Resource Strain: Not on file  Food Insecurity: Not on file  Transportation Needs: Not on file  Physical Activity: Not on file  Stress: Not on file  Social  Connections: Not on file  Intimate Partner Violence: Not on file    Review of Systems  Constitutional:  Positive for malaise/fatigue.  HENT: Negative.    Eyes: Negative.   Respiratory: Negative.    Cardiovascular: Negative.   Gastrointestinal:  Positive for diarrhea and nausea. Negative for abdominal pain, blood in stool and melena.  Genitourinary: Negative.   Musculoskeletal:  Positive for joint pain.  Skin: Negative.        wound  Neurological: Negative.   Endo/Heme/Allergies: Negative.   Psychiatric/Behavioral: Negative.    All other systems reviewed and are negative.       Objective    BP  138/82   Pulse 93   Temp 98.3 F (36.8 C)   Ht 5' 7 (1.702 m)   Wt 114 lb 2 oz (51.8 kg)   LMP 10/22/2014   SpO2 99%   BMI 17.87 kg/m   Physical Exam Vitals and nursing note reviewed.  Constitutional:      Appearance: Normal appearance. She is underweight.  HENT:     Head: Normocephalic and atraumatic.     Right Ear: Tympanic membrane, ear canal and external ear normal.     Left Ear: Tympanic membrane, ear canal and external ear normal.     Nose: Nose normal.     Mouth/Throat:     Mouth: Mucous membranes are moist.     Pharynx: Oropharynx is clear.  Eyes:     Extraocular Movements: Extraocular movements intact.     Conjunctiva/sclera: Conjunctivae normal.     Pupils: Pupils are equal, round, and reactive to light.  Cardiovascular:     Rate and Rhythm: Normal rate and regular rhythm.     Pulses: Normal pulses.     Heart sounds: Normal heart sounds.  Pulmonary:     Effort: Pulmonary effort is normal.     Breath sounds: Normal breath sounds.  Abdominal:     General: Bowel sounds are normal. There is no distension.     Palpations: Abdomen is soft.     Tenderness: There is no abdominal tenderness. There is no guarding or rebound.  Musculoskeletal:        General: Normal range of motion.     Cervical back: Normal range of motion and neck supple.  Skin:    General:  Skin is warm and dry.     Capillary Refill: Capillary refill takes less than 2 seconds.     Findings: Wound present.         Comments: 6cm x 3cm wound with necrotic tissue and surrounding erythema, purulent drainage.   Neurological:     General: No focal deficit present.     Mental Status: She is alert and oriented to person, place, and time. Mental status is at baseline.  Psychiatric:        Mood and Affect: Mood normal.        Behavior: Behavior normal.        Thought Content: Thought content normal.        Judgment: Judgment normal.         Assessment & Plan:   Problem List Items Addressed This Visit     Hyperlipidemia   Labs today. Not medicated.       Hypertension (Chronic)   138/82 today, will continue to monitor. Recommend heart healthy diet such as Mediterranean diet with whole grains, fruits, vegetable, fish, lean meats, nuts, and olive oil. Limit salt. Encouraged moderate walking, 3-5 times/week for 30-50 minutes each session. Aim for at least 150 minutes.week. Goal should be pace of 3 miles/hours, or walking 1.5 miles in 30 minutes. Avoid tobacco products. Avoid excess alcohol. Take medications as prescribed and bring medications and blood pressure log with cuff to each office visit. Seek medical care for chest pain, palpitations, shortness of breath with exertion, dizziness/lightheadedness, vision changes, recurrent headaches, or swelling of extremities. Follow up in 2 weeks      Alcohol abuse (Chronic)   Advised women should consume no more than 1 drink daily. Recommended cessation especially in the setting of Ibuprofen  use and recent vomiting and diarrhea. Recommended proper nutrition with adequate protein. Labs today  Relevant Orders   CBC with Differential/Platelet   Comprehensive metabolic panel with GFR   Lipid panel   TSH   VITAMIN D 25 Hydroxy (Vit-D Deficiency, Fractures)   Vitamin B12   Type II diabetes mellitus (HCC)   Unmedicated A1c and uACR  today. Foot exam UTD. Vaccines due. Retinal eye exam due. Recommend heart healthy diet such as Mediterranean diet with whole grains, fruits, vegetable, fish, lean meats, nuts, and olive oil. Limit salt. Encouraged moderate walking, 3-5 times/week for 30-50 minutes each session. Aim for at least 150 minutes.week. Goal should be pace of 3 miles/hours, or walking 1.5 miles in 30 minutes. Seek medical care for urinary frequency, extreme thirst, vision changes, lightheadedness, dizziness.  Not checking BG. Follow up in 2 weeks, labs today.      Relevant Orders   CBC with Differential/Platelet   Comprehensive metabolic panel with GFR   Lipid panel   Hemoglobin A1c   Physical exam, annual - Primary   Today your medical history was reviewed and routine physical exam with labs was performed. Recommend 150 minutes of moderate intensity exercise weekly and consuming a well-balanced diet. Advised to stop smoking if a smoker, avoid smoking if a non-smoker, limit alcohol consumption to 1 drink per day for women and 2 drinks per day for men, and avoid illicit drug use. Counseled in mental health awareness and when to seek medical care. Vaccine maintenance discussed. Appropriate health maintenance items reviewed. Return to office in 1 year for annual physical exam.       Relevant Orders   CBC with Differential/Platelet   Comprehensive metabolic panel with GFR   Lipid panel   TSH   Hemoglobin A1c   VITAMIN D 25 Hydroxy (Vit-D Deficiency, Fractures)   Vitamin B12   Clostridium difficile culture-fecal   Fecal Globin By Immunochemistry   Gastrointestinal Pathogen Pnl RT, PCR   Ova and parasite examination   Vomiting and diarrhea   Stool studies, CMP ordered. Start Zofran  8mg  q8h PRN. Advised soft, bland diet, push electrolyte fluids. Seek medical care if symptoms worsen or persist.       Relevant Orders   Clostridium difficile culture-fecal   Fecal Globin By Immunochemistry   Gastrointestinal Pathogen  Pnl RT, PCR   Ova and parasite examination   Non-healing wound of lower extremity, sequela   Applied xeroform and dry dressing, recommend daily santyl  with dry gauze. Referred to wound care. Bactrim  BID x7d. Follow up next week for wound check and lab discussion.       Relevant Orders   Ambulatory referral to Wound Clinic   Other Visit Diagnoses       Screening for lipoid disorders         Encounter for screening mammogram for malignant neoplasm of breast       Relevant Orders   MM DIGITAL SCREENING BILATERAL     Colon cancer screening       Relevant Orders   Ambulatory referral to Gastroenterology       Return in about 1 week (around 08/14/2023) for follow-up.   Jeoffrey GORMAN Barrio, FNP

## 2023-08-07 NOTE — Assessment & Plan Note (Signed)
 Advised women should consume no more than 1 drink daily. Recommended cessation especially in the setting of Ibuprofen  use and recent vomiting and diarrhea. Recommended proper nutrition with adequate protein. Labs today

## 2023-08-07 NOTE — Assessment & Plan Note (Signed)
 Applied xeroform and dry dressing, recommend daily santyl  with dry gauze. Referred to wound care. Bactrim  BID x7d. Follow up next week for wound check and lab discussion.

## 2023-08-07 NOTE — Patient Instructions (Signed)
 It was great to meet you today and I'm excited to have you join the [facility] practice. I hope you had a positive experience today! If you feel so inclined, please feel free to recommend our practice to friends and family. Kurtis Bushman, FNP-C

## 2023-08-07 NOTE — Assessment & Plan Note (Signed)
 Labs today. Not medicated.

## 2023-08-07 NOTE — Assessment & Plan Note (Signed)
 Unmedicated A1c and uACR today. Foot exam UTD. Vaccines due. Retinal eye exam due. Recommend heart healthy diet such as Mediterranean diet with whole grains, fruits, vegetable, fish, lean meats, nuts, and olive oil. Limit salt. Encouraged moderate walking, 3-5 times/week for 30-50 minutes each session. Aim for at least 150 minutes.week. Goal should be pace of 3 miles/hours, or walking 1.5 miles in 30 minutes. Seek medical care for urinary frequency, extreme thirst, vision changes, lightheadedness, dizziness.  Not checking BG. Follow up in 2 weeks, labs today.

## 2023-08-07 NOTE — Assessment & Plan Note (Addendum)
 138/82 today, will continue to monitor. Recommend heart healthy diet such as Mediterranean diet with whole grains, fruits, vegetable, fish, lean meats, nuts, and olive oil. Limit salt. Encouraged moderate walking, 3-5 times/week for 30-50 minutes each session. Aim for at least 150 minutes.week. Goal should be pace of 3 miles/hours, or walking 1.5 miles in 30 minutes. Avoid tobacco products. Avoid excess alcohol. Take medications as prescribed and bring medications and blood pressure log with cuff to each office visit. Seek medical care for chest pain, palpitations, shortness of breath with exertion, dizziness/lightheadedness, vision changes, recurrent headaches, or swelling of extremities. Follow up in 2 weeks

## 2023-08-07 NOTE — Assessment & Plan Note (Signed)

## 2023-08-11 ENCOUNTER — Ambulatory Visit: Payer: Self-pay | Admitting: Family Medicine

## 2023-08-11 DIAGNOSIS — R7401 Elevation of levels of liver transaminase levels: Secondary | ICD-10-CM

## 2023-08-14 LAB — COMPREHENSIVE METABOLIC PANEL WITH GFR
AG Ratio: 1.5 (calc) (ref 1.0–2.5)
ALT: 78 U/L — ABNORMAL HIGH (ref 6–29)
AST: 173 U/L — ABNORMAL HIGH (ref 10–35)
Albumin: 4.6 g/dL (ref 3.6–5.1)
Alkaline phosphatase (APISO): 123 U/L (ref 37–153)
BUN: 13 mg/dL (ref 7–25)
CO2: 22 mmol/L (ref 20–32)
Calcium: 9.7 mg/dL (ref 8.6–10.4)
Chloride: 96 mmol/L — ABNORMAL LOW (ref 98–110)
Creat: 0.65 mg/dL (ref 0.50–1.05)
Globulin: 3.1 g/dL (ref 1.9–3.7)
Glucose, Bld: 130 mg/dL — ABNORMAL HIGH (ref 65–99)
Potassium: 3.4 mmol/L — ABNORMAL LOW (ref 3.5–5.3)
Sodium: 138 mmol/L (ref 135–146)
Total Bilirubin: 1.3 mg/dL — ABNORMAL HIGH (ref 0.2–1.2)
Total Protein: 7.7 g/dL (ref 6.1–8.1)
eGFR: 99 mL/min/1.73m2 (ref >=60)

## 2023-08-14 LAB — LIPID PANEL
Cholesterol: 225 mg/dL — ABNORMAL HIGH (ref <200)
HDL: 89 mg/dL (ref >=50)
LDL Cholesterol (Calc): 114 mg/dL — ABNORMAL HIGH
Non-HDL Cholesterol (Calc): 136 mg/dL — ABNORMAL HIGH (ref <130)
Total CHOL/HDL Ratio: 2.5 (calc) (ref <5.0)
Triglycerides: 110 mg/dL (ref <150)

## 2023-08-14 LAB — CBC WITH DIFFERENTIAL/PLATELET
Absolute Lymphocytes: 828 {cells}/uL — ABNORMAL LOW (ref 850–3900)
Absolute Monocytes: 1287 {cells}/uL — ABNORMAL HIGH (ref 200–950)
Basophils Absolute: 144 {cells}/uL (ref 0–200)
Basophils Relative: 1.6 %
Eosinophils Absolute: 18 {cells}/uL (ref 15–500)
Eosinophils Relative: 0.2 %
HCT: 37 % (ref 35.0–45.0)
Hemoglobin: 13.4 g/dL (ref 11.7–15.5)
MCH: 35.1 pg — ABNORMAL HIGH (ref 27.0–33.0)
MCHC: 36.2 g/dL — ABNORMAL HIGH (ref 32.0–36.0)
MCV: 96.9 fL (ref 80.0–100.0)
MPV: 11.9 fL (ref 7.5–12.5)
Monocytes Relative: 14.3 %
Neutro Abs: 6723 {cells}/uL (ref 1500–7800)
Neutrophils Relative %: 74.7 %
Platelets: 100 Thousand/uL — ABNORMAL LOW (ref 140–400)
RBC: 3.82 Million/uL (ref 3.80–5.10)
RDW: 11.2 % (ref 11.0–15.0)
Total Lymphocyte: 9.2 %
WBC: 9 Thousand/uL (ref 3.8–10.8)

## 2023-08-14 LAB — TSH: TSH: 1.13 m[IU]/L (ref 0.40–4.50)

## 2023-08-14 LAB — TEST AUTHORIZATION

## 2023-08-14 LAB — HEPATITIS B SURFACE ANTIBODY, QUANTITATIVE: Hep B S AB Quant (Post): <5 m[IU]/mL — ABNORMAL LOW (ref >=10)

## 2023-08-14 LAB — HIV ANTIBODY (ROUTINE TESTING W REFLEX): HIV 1&2 Ab, 4th Generation: NONREACTIVE

## 2023-08-14 LAB — VITAMIN D 25 HYDROXY (VIT D DEFICIENCY, FRACTURES): Vit D, 25-Hydroxy: 50 ng/mL (ref 30–100)

## 2023-08-14 LAB — HEMOGLOBIN A1C
Hgb A1c MFr Bld: 5.5 % (ref <5.7)
Mean Plasma Glucose: 111 mg/dL
eAG (mmol/L): 6.2 mmol/L

## 2023-08-14 LAB — HEPATITIS C RNA QUANTITATIVE
HCV Quantitative Log: <1.18 {Log_IU}/mL
HCV RNA, PCR, QN: <15 [IU]/mL

## 2023-08-14 LAB — VITAMIN B12: Vitamin B-12: 609 pg/mL (ref 200–1100)

## 2023-08-14 LAB — HEPATITIS C ANTIBODY: Hepatitis C Ab: REACTIVE — AB

## 2023-08-18 ENCOUNTER — Inpatient Hospital Stay: Admission: RE | Admit: 2023-08-18 | Source: Ambulatory Visit

## 2023-08-20 NOTE — Progress Notes (Signed)
 Spoke to pt. By phone. Gave number for general scheduling as she has not heard from radiology for US  scheduling. She also asked for information to schedule her Mammogram. Gave the number for [facility]. Explained the importance of not using alcohol and tylenol . Pt. Understood. She stated she would call back and schedule for labs.

## 2023-08-26 NOTE — Telephone Encounter (Signed)
 Was unable to reach pt by phone, My Chart message sent. Pt did not read. Called pt today. Pt aware of all results and is coming in Thursday for an appointment with you and has u/s scheduled for Friday 08/29/2023. Thanks.    Result Notes   Jeoffrey GORMAN Barrio, FNP 08/11/2023  1:00 PM EDT Back to Top    Liver numbers were high, need to reiterate the importance of avoiding alcohol and Tylenol . Referral to GI placed and RUQ US  ordered, these are important. Will discuss rest at OV

## 2023-08-28 ENCOUNTER — Ambulatory Visit: Admitting: Family Medicine

## 2023-08-29 ENCOUNTER — Ambulatory Visit

## 2023-09-04 ENCOUNTER — Ambulatory Visit: Admitting: Physician Assistant

## 2023-09-16 ENCOUNTER — Ambulatory Visit: Payer: Self-pay

## 2023-09-16 ENCOUNTER — Telehealth: Payer: Self-pay

## 2023-09-16 ENCOUNTER — Telehealth: Payer: Self-pay | Admitting: Family Medicine

## 2023-09-16 NOTE — Telephone Encounter (Unsigned)
 This encounter was created in error - please disregard.

## 2023-09-16 NOTE — Telephone Encounter (Signed)
 Copied from CRM #[id number]. Topic: Clinical - Medication Question >> Sep 16, 2023 11:36 AM Leonette P wrote: Reason for CRM: Pt has been on an antibiotic for mohs she had on her leg.  She has finished the antibiotic and wants to know if she can get a refill.  It is stilling looking infected.  CB#  [phone]

## 2023-09-16 NOTE — Telephone Encounter (Unsigned)
 Copied from CRM [id number]. Topic: Clinical - Medication Refill >> Sep 16, 2023  2:11 PM Delon T wrote: Medication: sulfamethoxazole -trimethoprim  (BACTRIM  DS) 800-160 MG tablet  Has the patient contacted their pharmacy? No (Agent: If no, request that the patient contact the pharmacy for the refill. If patient does not wish to contact the pharmacy document the reason why and proceed with request.) (Agent: If yes, when and what did the pharmacy advise?)  This is the patient's preferred pharmacy:  [id number] [place], [place] - [address] [place] [address] Phone: [phone] Fax: [phone]  Is this the correct pharmacy for this prescription? Yes If no, delete pharmacy and type the correct one.   Has the prescription been filled recently? Yes  Is the patient out of the medication? Yes  Has the patient been seen for an appointment in the last year OR does the patient have an upcoming appointment? Yes  Can we respond through MyChart? Yes  Agent: Please be advised that Rx refills may take up to 3 business days. We ask that you follow-up with your pharmacy.

## 2023-09-16 NOTE — Telephone Encounter (Signed)
 FYI Only or Action Required?: Action required by provider: request for appointment and update on patient condition.  Patient was last seen in primary care on 08/07/2023 by Kayla Jeoffrey RAMAN, FNP.  Called Nurse Triage reporting Wound Infection.  Symptoms began several months ago.  Interventions attempted: Nothing.  Symptoms are: unchanged.  Triage Disposition: See Physician Within 24 Hours  Patient/caregiver understands and will follow disposition?: Unsure  Copied from CRM #[id number]. Topic: Clinical - Medication Question >> Sep 16, 2023 11:36 AM Leonette P wrote: Reason for CRM: Pt has been on an antibiotic for mohs she had on her leg.  She has finished the antibiotic and wants to know if she can get a refill.  It is stilling looking infected.  CB#  [phone] Reason for Disposition  [1] INCREASING pain in incision AND [2] > 2 days (48 hours) since surgery  Answer Assessment - Initial Assessment Questions 1. SYMPTOM: What's the main symptom you're concerned about? (e.g., drainage, incision opened up, pain, redness)     Patient states that it is infected and looks gross 2. ONSET: When did symptoms  start?     States that wound has always looked bad and has never healed 3. SURGERY: What surgery did you have?     Mohs surgery 4. DATE of SURGERY: When was the surgery?      02/28/2023 5. INCISION SITE: Where is the incision located?      Lower left leg on shin 6. REDNESS: Is there any redness at the incision site? If Yes, ask: How wide across is the redness? (Inches, centimeters)      States that the wound is red and yellow 7. PAIN: Is there any pain? If Yes, ask: How bad is it?  (Scale 0-10; or none, mild, moderate, severe)     Reports that it is painful 8. BLEEDING: Is there any bleeding? If Yes, ask: How much? and Where?     Wipes blood away about six times per day 9. DRAINAGE: Is there any drainage from the incision site? If Yes, ask: What color and how  much? (e.g., red, cloudy, pus; drops, teaspoon)     Yellowish orange drainage looks and smells like infection 10. FEVER: Do you have a fever? If Yes, ask: What is your temperature, how was it measured, and when did it start?       denies 11. OTHER SYMPTOMS: Do you have any other symptoms? (e.g., dizziness, rash elsewhere on body, shaking chills, weakness)       denies 12. PREGNANCY: Is there any chance you are pregnant? When was your last menstrual  period?       N/A  Explained that per chart:  Hi Ms. Cumberledge,   Unfortunately, Amber will not be able to refill your antibiotic. If you think the area of your Mohs surgery is infected or needs an extension of antibiotics, you should definitely contact your surgeon. They may want to see you. Thank you!  Ronal Bradley  Protocols used: Post-Op Incision Symptoms and Questions-A-AH

## 2023-09-24 ENCOUNTER — Encounter: Payer: Self-pay | Admitting: Family Medicine

## 2023-10-03 ENCOUNTER — Ambulatory Visit: Payer: Self-pay

## 2023-10-03 NOTE — Telephone Encounter (Signed)
 FYI Only or Action Required?: Action required by provider: request for appointment.  Patient was last seen in primary care on 08/07/2023 by Kayla Jeoffrey RAMAN, FNP.  Called Nurse Triage reporting Wound Infection.  Symptoms began several months ago.  Interventions attempted: Nothing.  Symptoms are: gradually worsening.  Triage Disposition: See Physician Within 24 Hours  Patient/caregiver understands and will follow disposition?: YesCopied from CRM #[id number]. Topic: Clinical - Red Word Triage >> Oct 03, 2023  9:15 AM Donna BRAVO wrote: Red Word that prompted transfer to Nurse Triage: patient had cancer skin removed on 02/28/23 left leg, now has infection, red, oozing, pus coming out Reason for Disposition  [1] Red area or streak AND [2] no fever  Answer Assessment - Initial Assessment Questions I'm not going back to that surgeon. He is terrible. Leg is warm to touch. No fever. Minimal pain at site. Pt requested Monday appt.      1. LOCATION: Where is the wound located?      Left leg 2. WOUND APPEARANCE: What does the wound look like?      Red, oozing, yellow/red pus 3. SIZE: If redness is present, ask: What is the size of the red area? (Inches, centimeters, or compare to size of a coin)      1.5/.75 4. SPREAD: What's changed in the last day?  Do you see any red streaks coming from the wound?     na 5. ONSET: When did it start to look infected?      Feb 28 2023 6. MECHANISM: How did the wound start, what was the cause?     Surgery  7. PAIN: Is there any pain? If Yes, ask: How bad is the pain?   (Scale 1-10; or mild, moderate, severe)     Mild  8. FEVER: Do you have a fever? If Yes, ask: What is your temperature, how was it measured, and when did it start?     denies 9. OTHER SYMPTOMS: Do you have any other symptoms? (e.g., shaking chills, weakness, rash elsewhere on body)   denies  Protocols used: Wound Infection-A-AH

## 2023-10-06 ENCOUNTER — Ambulatory Visit: Admitting: Family Medicine

## 2023-10-23 ENCOUNTER — Encounter: Payer: Self-pay | Admitting: Family Medicine

## 2024-01-12 ENCOUNTER — Ambulatory Visit (HOSPITAL_COMMUNITY)
Admission: EM | Admit: 2024-01-12 | Discharge: 2024-01-12 | Disposition: A | Discharge status: Home / Self Care | Attending: Emergency Medicine | Admitting: Emergency Medicine

## 2024-01-12 ENCOUNTER — Encounter (HOSPITAL_COMMUNITY): Payer: Self-pay

## 2024-01-12 DIAGNOSIS — L97822 Non-pressure chronic ulcer of other part of left lower leg with fat layer exposed: Secondary | ICD-10-CM

## 2024-01-12 NOTE — ED Triage Notes (Addendum)
 Pt c/o of (L) lower leg wound that has not healed after basal cell Moh's surgery in 02/28/2023 . Pt reports that wound is painful, draining, hard to get around. Pt reports taking 12-15 Advil  daily for the pain. Pt has not been using any gauze or ointments. Pt reports that she last took Advil  an hour ago.

## 2024-01-12 NOTE — ED Provider Notes (Signed)
 " [facility]    CSN: [id number] Arrival date & time: 01/12/24  1847      History   Chief Complaint Chief Complaint  Patient presents with   Wound Infection    HPI Sarah Simon is a 63 y.o. female.   Patient presents to clinic with family member over concern of a nonhealing wound to the left tibial area.  She had a Moh's procedure on February 14 earlier this year.  Since then the wound has just grown in size.  She has not followed up with dermatology or wound care.  Has been taking 12 or 15 Advil  daily for the pain.  Recently the wound has spread, become malodorous and become more painful.  Has not applied any dressings or tried any wound care.  Has not had any fevers.  The history is provided by the patient and a relative.    Past Medical History:  Diagnosis Date   Arthritis    back and bilateral knees   Factor V Leiden mutation    Hepatitis C carrier (HCC)    Hyperlipidemia    on meds   Hypertension 08/04/2006   On and off medications-Has cost problems for her medications=HTN without complications   Melanoma (HCC)    unclear diagnosis, this is per pt report, no treatment or follow up known. per pt on left leg   Pulmonary embolism (HCC) 05/2001    On Lifelong coumidin Factor V leiden def   Seasonal allergies    Skin cancer    multiple chest and back    Subarachnoid hemorrhage [facility])    Required surgery for clipping    Suicide attempt [facility])     Patient Active Problem List   Diagnosis Date Noted   Physical exam, annual 08/07/2023   Vomiting and diarrhea 08/07/2023   Non-healing wound of lower extremity, sequela 08/07/2023   Infected bite of lower leg, left, initial encounter 09/18/2022   Encounter for screening colonoscopy 04/17/2022   History of basal cell carcinoma 04/17/2022   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   Actinic keratosis 04/17/2022   Type II diabetes mellitus (HCC) 11/13/2021   'Light-for-dates' infant with signs of fetal  malnutrition 02/15/2020   Wound infection following procedure 12/11/2015   Rash and nonspecific skin eruption 03/16/2015   Osteoarthritis 04/27/2014   Liver cirrhosis with liver nodules 12/01/2013   Preventative health care 12/16/2012   History of melanoma 06/06/2011   Alcohol abuse 02/07/2011   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    OB History   No obstetric history on file.      Home Medications    Prior to Admission medications  Medication Sig Start Date End Date Taking? Authorizing Provider  ibuprofen  (ADVIL ) 800 MG tablet Take 1 tablet (800 mg total) by mouth 3 (three) times daily as needed for mild pain or moderate pain. 10/07/22  Yes Johnie, Carley A, NP  collagenase  (SANTYL ) 250 UNIT/GM ointment Apply 1 Application topically daily. 08/07/23   Kayla Jeoffrey RAMAN, FNP  ondansetron  (ZOFRAN -ODT) 8 MG  disintegrating tablet Take 1 tablet (8 mg total) by mouth every 8 (eight) hours as needed for nausea. 08/07/23   Kayla Jeoffrey RAMAN, FNP  sulfamethoxazole -trimethoprim  (BACTRIM  DS) 800-160 MG tablet Take 1 tablet by mouth 2 (two) times daily. 08/07/23   Kayla Jeoffrey RAMAN, FNP    Family History Family History  Problem Relation Age of Onset   Stroke Mother 41       brain aneurysm   Heart disease Father 32   Colon polyps Neg Hx    Colon cancer Neg Hx    Esophageal cancer Neg Hx    Stomach cancer Neg Hx    Rectal cancer Neg Hx     Social History Social History[1]   Allergies   Patient has no known allergies.   Review of Systems Review of  Systems  Per HPI  Physical Exam Triage Vital Signs ED Triage Vitals  Encounter Vitals Group     BP 01/12/24 1918 (!) 153/80     Girls Systolic BP Percentile --      Girls Diastolic BP Percentile --      Boys Systolic BP Percentile --      Boys Diastolic BP Percentile --      Pulse Rate 01/12/24 1918 74     Resp 01/12/24 1918 18     Temp 01/12/24 1918 98 F (36.7 C)     Temp Source 01/12/24 1918 Oral     SpO2 01/12/24 1918 94 %     Weight --      Height --      Head Circumference --      Peak Flow --      Pain Score 01/12/24 1915 10     Pain Loc --      Pain Education --      Exclude from Growth Chart --    No data found.  Updated Vital Signs BP (!) 153/80   Pulse 74   Temp 98 F (36.7 C) (Oral)   Resp 18   LMP 10/22/2014   SpO2 94%   Visual Acuity Right Eye Distance:   Left Eye Distance:   Bilateral Distance:    Right Eye Near:   Left Eye Near:    Bilateral Near:     Physical Exam Vitals and nursing note reviewed.  Constitutional:      Appearance: Normal appearance.  HENT:     Head: Normocephalic and atraumatic.     Right Ear: External ear normal.     Left Ear: External ear normal.     Nose: Nose normal.     Mouth/Throat:     Mouth: Mucous membranes are moist.  Eyes:     Conjunctiva/sclera: Conjunctivae normal.  Cardiovascular:     Rate and Rhythm: Normal rate.  Pulmonary:     Effort: Pulmonary effort is normal. No respiratory distress.  Musculoskeletal:     Left lower leg: Edema present.  Skin:    General: Skin is warm and dry.     Findings: Wound present.      Neurological:     General: No focal deficit present.     Mental Status: She is alert.  Psychiatric:        Mood and Affect: Mood normal.      UC Treatments / Results  Labs (all labs ordered are listed, but only abnormal results are displayed) Labs Reviewed - No data to display  EKG   Radiology No results found.  Procedures Procedures (including critical care  time)  Medications Ordered in UC Medications - No data to display  Initial Impression / Assessment and Plan / UC Course  I have reviewed the triage vital signs and the nursing notes.  Pertinent labs & imaging results that were available during my care of the patient were reviewed by me and considered in my medical decision making (see chart for details).  Vitals in triage reviewed, patient is hemodynamically stable.  Nonhealing necrotic wound to the left tibia, growing in size from previous media.  Encouraged follow-up with dermatology as they may need to rebiopsy the area.  Internal referral for dermatology and wound care sent.  Does not have evidence of systemic illness, without fever, tachycardia or streaking.  Wound appears to be localized.  Wound care provided in clinic prior to discharge and encouraged continuation of this at home.  Plan of care, follow-up care return precautions given, no questions at this time.  Emergency precautions given if symptoms evolve.    Final Clinical Impressions(s) / UC Diagnoses   Final diagnoses:  Ulcer of left pretibial region with fat layer exposed [facility])     Discharge Instructions      I am concerned over the nonhealing wound to your left shin.  Please keep the area clean, dry and covered throughout the day.  You can clean with warm water and antibacterial soap such as Dial or Hibiclens.  I have sent internal referrals to dermatology and our wound clinic for further care regarding care chronic wound of your shin.  Please do not take more than 3200 mg of ibuprofen  daily.  This would be around 600 to 800 mg every 8 hours as needed for pain.  For any fever, severe pain, or new concerning symptoms seek further care at the nearest emergency department.  You may be a candidate for IV antibiotics, advanced imaging and further care if this happens.     ED Prescriptions   None    PDMP not reviewed this encounter.     [1]  Social  History Tobacco Use   Smoking status: Former    Current packs/day: 0.00    Types: Cigarettes    Quit date: 04/23/1970    Years since quitting: 53.7   Smokeless tobacco: Never  Vaping Use   Vaping status: Never Used  Substance Use Topics   Alcohol use: Yes    Alcohol/week: 30.0 standard drinks of alcohol    Types: 6 Cans of beer, 24 Standard drinks or equivalent per week    Comment: Beer occasional   Drug use: Yes    Frequency: 4.0 times per week    Types: Marijuana     Dreama, Destane Speas  N, FNP 01/12/24 1948  "

## 2024-01-12 NOTE — Discharge Instructions (Signed)
 I am concerned over the nonhealing wound to your left shin.  Please keep the area clean, dry and covered throughout the day.  You can clean with warm water and antibacterial soap such as Dial or Hibiclens.  I have sent internal referrals to dermatology and our wound clinic for further care regarding care chronic wound of your shin.  Please do not take more than 3200 mg of ibuprofen  daily.  This would be around 600 to 800 mg every 8 hours as needed for pain.  For any fever, severe pain, or new concerning symptoms seek further care at the nearest emergency department.  You may be a candidate for IV antibiotics, advanced imaging and further care if this happens.

## 2024-01-12 NOTE — ED Triage Notes (Signed)
 Patient here with left leg pain. She states the pain in on the shin. She had a melanoma removed from her shin in 02/25 and she states the pain goes She reports that is is painful, red, drainage, warm to the touch and hurt sall the way to the bone.

## 2024-02-03 ENCOUNTER — Encounter (HOSPITAL_BASED_OUTPATIENT_CLINIC_OR_DEPARTMENT_OTHER): Attending: General Surgery | Admitting: General Surgery

## 2024-02-03 DIAGNOSIS — E11622 Type 2 diabetes mellitus with other skin ulcer: Secondary | ICD-10-CM | POA: Diagnosis not present

## 2024-02-03 DIAGNOSIS — D6851 Activated protein C resistance: Secondary | ICD-10-CM | POA: Diagnosis not present

## 2024-02-03 DIAGNOSIS — C44719 Basal cell carcinoma of skin of left lower limb, including hip: Secondary | ICD-10-CM | POA: Diagnosis not present

## 2024-02-03 DIAGNOSIS — L97829 Non-pressure chronic ulcer of other part of left lower leg with unspecified severity: Secondary | ICD-10-CM | POA: Diagnosis present

## 2024-02-03 DIAGNOSIS — X58XXXS Exposure to other specified factors, sequela: Secondary | ICD-10-CM | POA: Diagnosis not present

## 2024-02-03 DIAGNOSIS — T8131XS Disruption of external operation (surgical) wound, not elsewhere classified, sequela: Secondary | ICD-10-CM | POA: Insufficient documentation

## 2024-02-06 ENCOUNTER — Encounter (HOSPITAL_BASED_OUTPATIENT_CLINIC_OR_DEPARTMENT_OTHER): Admitting: General Surgery

## 2024-02-06 DIAGNOSIS — L97829 Non-pressure chronic ulcer of other part of left lower leg with unspecified severity: Secondary | ICD-10-CM | POA: Diagnosis not present

## 2024-02-11 ENCOUNTER — Encounter (HOSPITAL_BASED_OUTPATIENT_CLINIC_OR_DEPARTMENT_OTHER): Admitting: General Surgery

## 2024-02-17 ENCOUNTER — Encounter (HOSPITAL_BASED_OUTPATIENT_CLINIC_OR_DEPARTMENT_OTHER): Admitting: General Surgery

## 2024-02-24 ENCOUNTER — Encounter (HOSPITAL_BASED_OUTPATIENT_CLINIC_OR_DEPARTMENT_OTHER): Admitting: General Surgery
